# Patient Record
Sex: Male | Born: 1970
Health system: Southern US, Community
[De-identification: ages and names within clinical notes are randomized; demographics above are authoritative.]

## PROBLEM LIST (undated history)

## (undated) DIAGNOSIS — F419 Anxiety disorder, unspecified: Secondary | ICD-10-CM

## (undated) DIAGNOSIS — M5030 Other cervical disc degeneration, unspecified cervical region: Secondary | ICD-10-CM

## (undated) DIAGNOSIS — F329 Major depressive disorder, single episode, unspecified: Secondary | ICD-10-CM

## (undated) DIAGNOSIS — E785 Hyperlipidemia, unspecified: Secondary | ICD-10-CM

## (undated) DIAGNOSIS — F32A Depression, unspecified: Secondary | ICD-10-CM

## (undated) DIAGNOSIS — I129 Hypertensive chronic kidney disease with stage 1 through stage 4 chronic kidney disease, or unspecified chronic kidney disease: Secondary | ICD-10-CM

## (undated) DIAGNOSIS — F17200 Nicotine dependence, unspecified, uncomplicated: Secondary | ICD-10-CM

## (undated) DIAGNOSIS — L409 Psoriasis, unspecified: Secondary | ICD-10-CM

## (undated) DIAGNOSIS — E119 Type 2 diabetes mellitus without complications: Secondary | ICD-10-CM

## (undated) DIAGNOSIS — I10 Essential (primary) hypertension: Secondary | ICD-10-CM

## (undated) DIAGNOSIS — R7303 Prediabetes: Secondary | ICD-10-CM

## (undated) HISTORY — DX: Depression, unspecified: F32.A

## (undated) HISTORY — DX: Essential (primary) hypertension: I10

## (undated) HISTORY — DX: Type 2 diabetes mellitus without complications: E11.9

## (undated) HISTORY — DX: Anxiety disorder, unspecified: F41.9

## (undated) HISTORY — DX: Nicotine dependence, unspecified, uncomplicated: F17.200

## (undated) HISTORY — DX: Hypertensive chronic kidney disease with stage 1 through stage 4 chronic kidney disease, or unspecified chronic kidney disease: I12.9

## (undated) HISTORY — PX: WISDOM TOOTH EXTRACTION: SHX21

## (undated) HISTORY — DX: Major depressive disorder, single episode, unspecified: F32.9

## (undated) HISTORY — DX: Hyperlipidemia, unspecified: E78.5

---

## 2014-08-31 ENCOUNTER — Ambulatory Visit: Payer: Self-pay

## 2014-12-28 ENCOUNTER — Encounter: Payer: Self-pay | Admitting: *Deleted

## 2014-12-28 DIAGNOSIS — F17200 Nicotine dependence, unspecified, uncomplicated: Secondary | ICD-10-CM

## 2014-12-28 DIAGNOSIS — E1169 Type 2 diabetes mellitus with other specified complication: Secondary | ICD-10-CM | POA: Insufficient documentation

## 2014-12-28 DIAGNOSIS — I10 Essential (primary) hypertension: Secondary | ICD-10-CM

## 2014-12-28 DIAGNOSIS — E669 Obesity, unspecified: Secondary | ICD-10-CM

## 2014-12-28 DIAGNOSIS — I129 Hypertensive chronic kidney disease with stage 1 through stage 4 chronic kidney disease, or unspecified chronic kidney disease: Secondary | ICD-10-CM

## 2014-12-28 DIAGNOSIS — E785 Hyperlipidemia, unspecified: Secondary | ICD-10-CM

## 2015-02-23 ENCOUNTER — Encounter (INDEPENDENT_AMBULATORY_CARE_PROVIDER_SITE_OTHER): Payer: Self-pay

## 2015-02-23 ENCOUNTER — Telehealth: Payer: Self-pay

## 2015-02-23 ENCOUNTER — Ambulatory Visit (INDEPENDENT_AMBULATORY_CARE_PROVIDER_SITE_OTHER): Payer: 59 | Admitting: Unknown Physician Specialty

## 2015-02-23 ENCOUNTER — Encounter: Payer: Self-pay | Admitting: Unknown Physician Specialty

## 2015-02-23 VITALS — BP 111/76 | HR 65 | Temp 98.3°F | Wt 257.6 lb

## 2015-02-23 DIAGNOSIS — Z0000 Encounter for general adult medical examination without abnormal findings: Secondary | ICD-10-CM | POA: Diagnosis not present

## 2015-02-23 DIAGNOSIS — J309 Allergic rhinitis, unspecified: Secondary | ICD-10-CM | POA: Diagnosis not present

## 2015-02-23 DIAGNOSIS — E785 Hyperlipidemia, unspecified: Secondary | ICD-10-CM | POA: Diagnosis not present

## 2015-02-23 DIAGNOSIS — I10 Essential (primary) hypertension: Secondary | ICD-10-CM

## 2015-02-23 DIAGNOSIS — G8929 Other chronic pain: Secondary | ICD-10-CM | POA: Insufficient documentation

## 2015-02-23 DIAGNOSIS — M545 Low back pain: Secondary | ICD-10-CM | POA: Insufficient documentation

## 2015-02-23 DIAGNOSIS — M5441 Lumbago with sciatica, right side: Secondary | ICD-10-CM | POA: Diagnosis not present

## 2015-02-23 MED ORDER — FLUTICASONE PROPIONATE 50 MCG/ACT NA SUSP: 2.0000 | Freq: Every day | NASAL | Status: DC

## 2015-02-23 NOTE — Assessment & Plan Note (Signed)
With sciatica that comes and goes now.  Encouraged stretching and keeping as active as possible.  Discussed weight loss.

## 2015-02-23 NOTE — Patient Instructions (Signed)

## 2015-02-23 NOTE — Assessment & Plan Note (Signed)
Stable.  Continue present meds 

## 2015-02-23 NOTE — Assessment & Plan Note (Signed)
Rx for Flonase for allergies

## 2015-02-23 NOTE — Telephone Encounter (Signed)
Pt.notified

## 2015-02-23 NOTE — Telephone Encounter (Signed)
-----   Message from Kathrine Haddock, NP sent at 02/23/2015  5:04 PM EDT ----- Regarding: Please let pt know that I forgot To tell him I am giving flonase for allergies.  I sent it to the pharmacy

## 2015-02-23 NOTE — Assessment & Plan Note (Signed)
Check lipid panel  

## 2015-02-23 NOTE — Progress Notes (Signed)
BP 111/76 mmHg  Pulse 65  Temp(Src) 98.3 F (36.8 C) (Oral)  Wt 257 lb 9.6 oz (116.847 kg)  SpO2 97%   Subjective:    Patient ID: Lance Kim, male    DOB: 1971-08-06, 44 y.o.   MRN: [id number]  HPI: Lance Kim is a 44 y.o. male  Chief Complaint  Patient presents with  . Annual Exam    Sciatic pain  . Sinus Problem    Relevant past medical, surgical, family and social history reviewed and updated as indicated. Interim medical history since our last visit reviewed. Allergies and medications reviewed and updated.  Sinus Problem This is a new problem. The problem is unchanged. There has been no fever. Associated symptoms include sinus pressure and sneezing. Past treatments include nothing.  Hypertension This is a chronic problem. The current episode started more than 1 year ago. The problem is controlled. The current treatment provides significant improvement. There are no compliance problems.         Review of Systems  Constitutional: Negative.   HENT: Positive for rhinorrhea, sinus pressure and sneezing.   Eyes: Negative.   Respiratory: Negative.   Cardiovascular: Negative.   Gastrointestinal: Negative.   Endocrine: Negative.   Genitourinary: Negative.   Musculoskeletal: Positive for back pain.       Back pain with radiation to ankle.  Comes and goes.    Skin: Negative for color change.       Per dermatology  Allergic/Immunologic: Negative.   Neurological: Negative.   Hematological: Negative.   Psychiatric/Behavioral: Negative.     Per HPI unless specifically indicated above     Objective:    BP 111/76 mmHg  Pulse 65  Temp(Src) 98.3 F (36.8 C) (Oral)  Wt 257 lb 9.6 oz (116.847 kg)  SpO2 97%  Wt Readings from Last 3 Encounters:  02/23/15 257 lb 9.6 oz (116.847 kg)  11/24/14 255 lb (115.667 kg)  11/24/14 255 lb (115.667 kg)    Physical Exam  Constitutional: He is oriented to person, place, and time. He appears well-developed and  well-nourished.  HENT:  Head: Normocephalic.  Eyes: Pupils are equal, round, and reactive to light.  Cardiovascular: Normal rate, regular rhythm and normal heart sounds.   Pulmonary/Chest: Effort normal.  Abdominal: Soft. Bowel sounds are normal.  Musculoskeletal: Normal range of motion.  Neurological: He is alert and oriented to person, place, and time. He has normal reflexes.  Skin: Skin is warm and dry.  Changes related to psoriasis  Psychiatric: He has a normal mood and affect. His behavior is normal. Judgment and thought content normal.      No results found for this or any previous visit.    Assessment & Plan:   Problem List Items Addressed This Visit      Cardiovascular and Mediastinum   Benign essential HTN    Stable.  Continue present meds      Relevant Orders   Comprehensive metabolic panel     Respiratory   Allergic rhinitis    Rx for Flonase for allergies        Other   Hyperlipemia - Primary    Check lipid panel      Relevant Orders   Lipid Panel w/o Chol/HDL Ratio   Low back pain    With sciatica that comes and goes now.  Encouraged stretching and keeping as active as possible.  Discussed weight loss.          Other Visit Diagnoses  Routine general medical examination at a health care facility        Relevant Orders    TSH    CBC        Follow up plan: Return in about 6 months (around 08/25/2015).

## 2015-02-24 LAB — CBC
HEMOGLOBIN: 14.5 g/dL (ref 12.6–17.7)
Hematocrit: 43 % (ref 37.5–51.0)
MCH: 29.2 pg (ref 26.6–33.0)
MCHC: 33.7 g/dL (ref 31.5–35.7)
MCV: 87 fL (ref 79–97)
Platelets: 222 10*3/uL (ref 150–379)
RBC: 4.96 x10E6/uL (ref 4.14–5.80)
RDW: 14.3 % (ref 12.3–15.4)
WBC: 7.9 10*3/uL (ref 3.4–10.8)

## 2015-02-24 LAB — COMPREHENSIVE METABOLIC PANEL
ALBUMIN: 4.1 g/dL (ref 3.5–5.5)
ALT: 30 IU/L (ref 0–44)
AST: 23 IU/L (ref 0–40)
Albumin/Globulin Ratio: 1.4 (ref 1.1–2.5)
Alkaline Phosphatase: 85 IU/L (ref 39–117)
BUN / CREAT RATIO: 12 (ref 9–20)
BUN: 14 mg/dL (ref 6–24)
Bilirubin Total: 0.2 mg/dL (ref 0.0–1.2)
CHLORIDE: 99 mmol/L (ref 97–108)
CO2: 25 mmol/L (ref 18–29)
Calcium: 9.6 mg/dL (ref 8.7–10.2)
Creatinine, Ser: 1.13 mg/dL (ref 0.76–1.27)
GFR calc non Af Amer: 78 mL/min/{1.73_m2} (ref >59)
GFR, EST AFRICAN AMERICAN: 90 mL/min/{1.73_m2} (ref >59)
Globulin, Total: 3 g/dL (ref 1.5–4.5)
Glucose: 92 mg/dL (ref 65–99)
Potassium: 4.5 mmol/L (ref 3.5–5.2)
SODIUM: 139 mmol/L (ref 134–144)
TOTAL PROTEIN: 7.1 g/dL (ref 6.0–8.5)

## 2015-02-24 LAB — TSH: TSH: 0.715 u[IU]/mL (ref 0.450–4.500)

## 2015-02-24 LAB — LIPID PANEL W/O CHOL/HDL RATIO
CHOLESTEROL TOTAL: 124 mg/dL (ref 100–199)
HDL: 33 mg/dL — AB (ref >39)
LDL Calculated: 61 mg/dL (ref 0–99)
Triglycerides: 151 mg/dL — ABNORMAL HIGH (ref 0–149)
VLDL CHOLESTEROL CAL: 30 mg/dL (ref 5–40)

## 2015-02-26 ENCOUNTER — Telehealth: Payer: Self-pay

## 2015-02-26 NOTE — Telephone Encounter (Signed)
Called and spoke to pt's mother, she stated he was not there so I asked for him to give [place] a call back.

## 2015-02-26 NOTE — Telephone Encounter (Signed)
-----   Message from Kathrine Haddock, NP sent at 02/26/2015  8:13 AM EDT ----- Call tell patient that labs are normal

## 2015-04-08 ENCOUNTER — Other Ambulatory Visit: Payer: Self-pay | Admitting: Unknown Physician Specialty

## 2015-05-04 ENCOUNTER — Ambulatory Visit (INDEPENDENT_AMBULATORY_CARE_PROVIDER_SITE_OTHER): Payer: 59 | Admitting: Unknown Physician Specialty

## 2015-05-04 ENCOUNTER — Encounter: Payer: Self-pay | Admitting: Unknown Physician Specialty

## 2015-05-04 VITALS — BP 122/89 | HR 68 | Temp 98.0°F | Ht 64.2 in | Wt 259.0 lb

## 2015-05-04 DIAGNOSIS — J069 Acute upper respiratory infection, unspecified: Secondary | ICD-10-CM | POA: Diagnosis not present

## 2015-05-04 MED ORDER — AMOXICILLIN 875 MG PO TABS: 875.0000 mg | ORAL_TABLET | Freq: Two times a day (BID) | ORAL | Status: DC

## 2015-05-04 NOTE — Progress Notes (Signed)
   BP 122/89 mmHg  Pulse 68  Temp(Src) 98 F (36.7 C)  Ht 5' 4.2" (1.631 m)  Wt 259 lb (117.482 kg)  BMI 44.16 kg/m2  SpO2 98%   Subjective:    Patient ID: Lance Kim, male    DOB: 1970-09-16, 44 y.o.   MRN: [id number]  HPI: Lance Kim is a 44 y.o. male  Chief Complaint  Patient presents with  . Sinusitis    pt states he has runny nose, headache, facial pain, tooth pain, drainage, and cough   Sinusitis This is a new problem. The current episode started in the past 7 days. The problem has been gradually worsening since onset. There has been no fever. Associated symptoms include congestion, coughing, ear pain, headaches and sinus pressure. Pertinent negatives include no shortness of breath or sore throat. (Tooth pain) Past treatments include oral decongestants. The treatment provided no relief.    Relevant past medical, surgical, family and social history reviewed and updated as indicated. Interim medical history since our last visit reviewed. Allergies and medications reviewed and updated.  Review of Systems  HENT: Positive for congestion, ear pain and sinus pressure. Negative for sore throat.   Respiratory: Positive for cough. Negative for shortness of breath.   Neurological: Positive for headaches.    Per HPI unless specifically indicated above     Objective:    BP 122/89 mmHg  Pulse 68  Temp(Src) 98 F (36.7 C)  Ht 5' 4.2" (1.631 m)  Wt 259 lb (117.482 kg)  BMI 44.16 kg/m2  SpO2 98%  Wt Readings from Last 3 Encounters:  05/04/15 259 lb (117.482 kg)  02/23/15 257 lb 9.6 oz (116.847 kg)  11/24/14 255 lb (115.667 kg)    Physical Exam  Constitutional: He is oriented to person, place, and time. He appears well-developed and well-nourished. No distress.  HENT:  Head: Normocephalic and atraumatic.  Right Ear: Tympanic membrane, external ear and ear canal normal.  Left Ear: Tympanic membrane, external ear and ear canal normal.  Nose: Rhinorrhea present.  Right sinus exhibits no maxillary sinus tenderness and no frontal sinus tenderness. Left sinus exhibits no maxillary sinus tenderness and no frontal sinus tenderness.  Mouth/Throat: Mucous membranes are normal. Posterior oropharyngeal erythema present.  Eyes: Conjunctivae and lids are normal. Right eye exhibits no discharge. Left eye exhibits no discharge. No scleral icterus.  Cardiovascular: Normal rate and regular rhythm.   Pulmonary/Chest: Effort normal and breath sounds normal. No respiratory distress.  Abdominal: Normal appearance. There is no splenomegaly or hepatomegaly.  Musculoskeletal: Normal range of motion.  Neurological: He is alert and oriented to person, place, and time.  Skin: Skin is intact. No rash noted. No pallor.  Psychiatric: He has a normal mood and affect. His behavior is normal. Judgment and thought content normal.  Nursing note reviewed.      Assessment & Plan:   Problem List Items Addressed This Visit    None    Visit Diagnoses    URI (upper respiratory infection)    -  Primary    ? sinusitis.  Will rx antibiotic and if improveing, will not take        Follow up plan: Return if symptoms worsen or fail to improve.

## 2015-05-08 ENCOUNTER — Other Ambulatory Visit: Payer: Self-pay | Admitting: Unknown Physician Specialty

## 2015-06-09 ENCOUNTER — Other Ambulatory Visit: Payer: Self-pay | Admitting: Unknown Physician Specialty

## 2015-08-06 ENCOUNTER — Encounter: Payer: Self-pay | Admitting: Unknown Physician Specialty

## 2015-08-06 ENCOUNTER — Ambulatory Visit (INDEPENDENT_AMBULATORY_CARE_PROVIDER_SITE_OTHER): Payer: 59 | Admitting: Unknown Physician Specialty

## 2015-08-06 VITALS — BP 123/84 | HR 69 | Temp 98.5°F | Ht 62.7 in | Wt 259.2 lb

## 2015-08-06 DIAGNOSIS — J0100 Acute maxillary sinusitis, unspecified: Secondary | ICD-10-CM | POA: Diagnosis not present

## 2015-08-06 MED ORDER — AMOXICILLIN-POT CLAVULANATE 875-125 MG PO TABS: 1.0000 | ORAL_TABLET | Freq: Two times a day (BID) | ORAL | Status: DC

## 2015-08-06 NOTE — Progress Notes (Signed)
BP 123/84 mmHg  Pulse 69  Temp(Src) 98.5 F (36.9 C)  Ht 5' 2.7" (1.593 m)  Wt 259 lb 3.2 oz (117.572 kg)  BMI 46.33 kg/m2  SpO2 98%   Subjective:    Patient ID: Lance Kim, male    DOB: 1971-07-25, 44 y.o.   MRN: [id number]  HPI: Lance Kim is a 44 y.o. male  Chief Complaint  Patient presents with  . URI    pt states he has stuffy nose, cough (productive), sore throat, sneezing, body aches and nasal cogestion.  States he has tried OTC medications that helped a few weeks ago but now symptoms have come back.   . Pain    pt states he has pain in left shoulder and on left side of chest.   URI  This is a new problem. The current episode started 1 to 4 weeks ago. The problem has been gradually worsening (got better and then worse). There has been no fever. Associated symptoms include chest pain, congestion, coughing and sinus pain. Pertinent negatives include no rash, sore throat or wheezing. He has tried decongestant and antihistamine for the symptoms. The treatment provided moderate relief.     Relevant past medical, surgical, family and social history reviewed and updated as indicated. Interim medical history since our last visit reviewed. Allergies and medications reviewed and updated.  Review of Systems  HENT: Positive for congestion. Negative for sore throat.   Respiratory: Positive for cough. Negative for wheezing.   Cardiovascular: Positive for chest pain.  Skin: Negative for rash.    Per HPI unless specifically indicated above     Objective:    BP 123/84 mmHg  Pulse 69  Temp(Src) 98.5 F (36.9 C)  Ht 5' 2.7" (1.593 m)  Wt 259 lb 3.2 oz (117.572 kg)  BMI 46.33 kg/m2  SpO2 98%  Wt Readings from Last 3 Encounters:  08/06/15 259 lb 3.2 oz (117.572 kg)  05/04/15 259 lb (117.482 kg)  02/23/15 257 lb 9.6 oz (116.847 kg)    Physical Exam  Constitutional: He is oriented to person, place, and time. He appears well-developed and well-nourished. No distress.   HENT:  Head: Normocephalic and atraumatic.  Right Ear: Tympanic membrane and ear canal normal.  Left Ear: Tympanic membrane and ear canal normal.  Nose: Mucosal edema and sinus tenderness present. Right sinus exhibits maxillary sinus tenderness. Left sinus exhibits maxillary sinus tenderness.  Mouth/Throat: Mucous membranes are normal. Oropharyngeal exudate and posterior oropharyngeal edema present.  Eyes: Conjunctivae and lids are normal. Right eye exhibits no discharge. Left eye exhibits no discharge. No scleral icterus.  Cardiovascular: Normal rate and regular rhythm.   Pulmonary/Chest: Effort normal. No respiratory distress.  Abdominal: Normal appearance and bowel sounds are normal. He exhibits no distension. There is no splenomegaly or hepatomegaly. There is no tenderness.  Musculoskeletal: Normal range of motion.  Neurological: He is alert and oriented to person, place, and time.  Skin: Skin is intact. No rash noted. No pallor.  Psychiatric: He has a normal mood and affect. His behavior is normal. Judgment and thought content normal.       Assessment & Plan:   Problem List Items Addressed This Visit    None    Visit Diagnoses    Acute maxillary sinusitis, recurrence not specified    -  Primary    Relevant Medications    amoxicillin-clavulanate (AUGMENTIN) 875-125 MG tablet        Follow up plan: Return if symptoms worsen or fail  to improve.

## 2015-08-27 ENCOUNTER — Encounter: Payer: Self-pay | Admitting: Unknown Physician Specialty

## 2015-08-27 ENCOUNTER — Ambulatory Visit (INDEPENDENT_AMBULATORY_CARE_PROVIDER_SITE_OTHER): Payer: 59 | Admitting: Unknown Physician Specialty

## 2015-08-27 VITALS — BP 120/82 | HR 76 | Temp 98.4°F | Ht 63.7 in | Wt 258.8 lb

## 2015-08-27 DIAGNOSIS — E669 Obesity, unspecified: Secondary | ICD-10-CM

## 2015-08-27 DIAGNOSIS — F17200 Nicotine dependence, unspecified, uncomplicated: Secondary | ICD-10-CM | POA: Diagnosis not present

## 2015-08-27 DIAGNOSIS — E785 Hyperlipidemia, unspecified: Secondary | ICD-10-CM | POA: Diagnosis not present

## 2015-08-27 DIAGNOSIS — I10 Essential (primary) hypertension: Secondary | ICD-10-CM | POA: Diagnosis not present

## 2015-08-27 MED ORDER — FLUOXETINE HCL 20 MG PO TABS: 20.0000 mg | ORAL_TABLET | Freq: Every day | ORAL | Status: DC

## 2015-08-27 MED ORDER — LISINOPRIL-HYDROCHLOROTHIAZIDE 20-12.5 MG PO TABS: 1.0000 | ORAL_TABLET | Freq: Every day | ORAL | Status: DC

## 2015-08-27 MED ORDER — MELOXICAM 7.5 MG PO TABS: 7.5000 mg | ORAL_TABLET | Freq: Every day | ORAL | Status: DC

## 2015-08-27 MED ORDER — AMLODIPINE BESYLATE 10 MG PO TABS: 10.0000 mg | ORAL_TABLET | Freq: Every day | ORAL | Status: DC

## 2015-08-27 NOTE — Assessment & Plan Note (Signed)
Work on diet

## 2015-08-27 NOTE — Assessment & Plan Note (Signed)
Stable, continue present medications.   

## 2015-08-27 NOTE — Assessment & Plan Note (Signed)
Not ready to quit.  

## 2015-08-27 NOTE — Assessment & Plan Note (Signed)
Refill Mobic

## 2015-08-27 NOTE — Assessment & Plan Note (Signed)
Check Lipid panel 

## 2015-08-27 NOTE — Addendum Note (Signed)
Addended by: Kathrine Haddock on: 08/27/2015 04:47 PM   Modules accepted: Orders

## 2015-08-27 NOTE — Progress Notes (Signed)
+  BP 120/82 mmHg  Pulse 76  Temp(Src) 98.4 F (36.9 C)  Ht 5' 3.7" (1.618 m)  Wt 258 lb 12.8 oz (117.391 kg)  BMI 44.84 kg/m2  SpO2 97%   Subjective:    Patient ID: Lance Kim, male    DOB: [birth year], 44 y.o.   MRN: [id number]  HPI: Lance Kim is a 44 y.o. male  Chief Complaint  Patient presents with  . Hypertension  . Hyperlipidemia  . Depression   Hypertension Using medications without difficulty Average home BPs: not cheicking   No problems or lightheadedness No chest pain with exertion or shortness of breath No Edema   Hyperlipidemia Using medications without problems: No Muscle aches  Diet compliance: "not where I want it."  "A work in progress" Exercise:  "not where I want it."  "A work in progress"  Depression Stable.  Pt states he feels good "most of the time." Depression screen [facility] 2/9 08/27/2015  Decreased Interest 0  Down, Depressed, Hopeless 0  PHQ - 2 Score 0   Low back pain Takes Mobic periodically  Relevant past medical, surgical, family and social history reviewed and updated as indicated. Interim medical history since our last visit reviewed. Allergies and medications reviewed and updated.  Review of Systems  Per HPI unless specifically indicated above     Objective:    BP 120/82 mmHg  Pulse 76  Temp(Src) 98.4 F (36.9 C)  Ht 5' 3.7" (1.618 m)  Wt 258 lb 12.8 oz (117.391 kg)  BMI 44.84 kg/m2  SpO2 97%  Wt Readings from Last 3 Encounters:  08/27/15 258 lb 12.8 oz (117.391 kg)  08/06/15 259 lb 3.2 oz (117.572 kg)  05/04/15 259 lb (117.482 kg)    Physical Exam  Constitutional: He is oriented to person, place, and time. He appears well-developed and well-nourished. No distress.  HENT:  Head: Normocephalic and atraumatic.  Eyes: Conjunctivae and lids are normal. Right eye exhibits no discharge. Left eye exhibits no discharge. No scleral icterus.  Neck: Normal range of motion. Neck supple. No JVD present. Carotid bruit is  not present.  Cardiovascular: Normal rate, regular rhythm and normal heart sounds.   Pulmonary/Chest: Effort normal and breath sounds normal. No respiratory distress.  Abdominal: Normal appearance. There is no splenomegaly or hepatomegaly.  Musculoskeletal: Normal range of motion.  Neurological: He is alert and oriented to person, place, and time.  Skin: Skin is warm, dry and intact. No rash noted. No pallor.  Psychiatric: He has a normal mood and affect. His behavior is normal. Judgment and thought content normal.    Assessment & Plan:   Problem List Items Addressed This Visit      Unprioritized   Hyperlipemia    Check Lipid panel       Relevant Medications   amLODipine (NORVASC) 10 MG tablet   lisinopril-hydrochlorothiazide (PRINZIDE,ZESTORETIC) 20-12.5 MG tablet   Other Relevant Orders   Lipid Panel Piccolo, Waived   Benign essential HTN - Primary    Stable, continue present medications.        Relevant Medications   amLODipine (NORVASC) 10 MG tablet   lisinopril-hydrochlorothiazide (PRINZIDE,ZESTORETIC) 20-12.5 MG tablet   Other Relevant Orders   Microalbumin, Urine Waived   Uric acid   Comprehensive metabolic panel   Obesity    Work on diet      Tobacco use disorder    Not ready to quit         Non-fasting labs Follow up plan: Return in  about 6 months (around 02/25/2016) for physical.

## 2015-08-28 ENCOUNTER — Encounter: Payer: Self-pay | Admitting: Unknown Physician Specialty

## 2015-08-28 LAB — COMPREHENSIVE METABOLIC PANEL
A/G RATIO: 1.3 (ref 1.1–2.5)
ALK PHOS: 93 IU/L (ref 39–117)
ALT: 30 IU/L (ref 0–44)
AST: 22 IU/L (ref 0–40)
Albumin: 4 g/dL (ref 3.5–5.5)
BUN/Creatinine Ratio: 8 — ABNORMAL LOW (ref 9–20)
BUN: 9 mg/dL (ref 6–24)
CALCIUM: 10 mg/dL (ref 8.7–10.2)
CHLORIDE: 98 mmol/L (ref 96–106)
CO2: 24 mmol/L (ref 18–29)
Creatinine, Ser: 1.11 mg/dL (ref 0.76–1.27)
GFR calc Af Amer: 92 mL/min/{1.73_m2} (ref >59)
GFR, EST NON AFRICAN AMERICAN: 80 mL/min/{1.73_m2} (ref >59)
GLOBULIN, TOTAL: 3.1 g/dL (ref 1.5–4.5)
Glucose: 92 mg/dL (ref 65–99)
POTASSIUM: 4.2 mmol/L (ref 3.5–5.2)
SODIUM: 138 mmol/L (ref 134–144)
Total Protein: 7.1 g/dL (ref 6.0–8.5)

## 2015-08-28 LAB — URIC ACID: URIC ACID: 6.8 mg/dL (ref 3.7–8.6)

## 2015-08-28 LAB — LIPID PANEL W/O CHOL/HDL RATIO
CHOLESTEROL TOTAL: 117 mg/dL (ref 100–199)
HDL: 35 mg/dL — AB (ref >39)
LDL Calculated: 61 mg/dL (ref 0–99)
TRIGLYCERIDES: 105 mg/dL (ref 0–149)
VLDL Cholesterol Cal: 21 mg/dL (ref 5–40)

## 2015-08-28 LAB — MICROALBUMIN, URINE WAIVED
CREATININE, URINE WAIVED: 100 mg/dL (ref 10–300)
MICROALB, UR WAIVED: 10 mg/L (ref 0–19)

## 2015-08-28 NOTE — Progress Notes (Signed)
Quick Note:  Normal labs. Patient notified by letter. ______ 

## 2015-09-04 ENCOUNTER — Other Ambulatory Visit: Payer: Self-pay | Admitting: Family Medicine

## 2015-09-04 ENCOUNTER — Telehealth: Payer: Self-pay

## 2015-09-04 MED ORDER — AMOXICILLIN-POT CLAVULANATE 875-125 MG PO TABS: 1.0000 | ORAL_TABLET | Freq: Two times a day (BID) | ORAL | Status: DC

## 2015-09-04 NOTE — Telephone Encounter (Signed)
Patient called and stated he feel like he is getting the same thing he had last month. Patient was here 08/06/15 and was give amoxicillin-clavulanate (augmentin). Wants to know if he can get another round or if he needs to come in to be seen.

## 2015-09-04 NOTE — Telephone Encounter (Signed)
I accidentally closed the encounter I just opened for this patient before routing it so I am doing a new one. Patient called and stated he feels like he is getting the same thing he had a month ago. Patient was seen 08/06/15 and was given amoxicillin-clavulanate (augmentin). Patient wants to know if he needs to come in or if he can get some more medicine.

## 2015-09-04 NOTE — Telephone Encounter (Signed)
rx given

## 2015-09-05 NOTE — Telephone Encounter (Signed)
Called and let patient know another rx was given.

## 2016-01-09 ENCOUNTER — Encounter: Payer: Self-pay | Admitting: Unknown Physician Specialty

## 2016-02-05 ENCOUNTER — Ambulatory Visit (INDEPENDENT_AMBULATORY_CARE_PROVIDER_SITE_OTHER): Payer: 59 | Admitting: Unknown Physician Specialty

## 2016-02-05 ENCOUNTER — Encounter: Payer: Self-pay | Admitting: Unknown Physician Specialty

## 2016-02-05 VITALS — BP 126/75 | HR 66 | Temp 98.7°F | Ht 64.1 in | Wt 254.6 lb

## 2016-02-05 DIAGNOSIS — J029 Acute pharyngitis, unspecified: Secondary | ICD-10-CM

## 2016-02-05 DIAGNOSIS — M5441 Lumbago with sciatica, right side: Secondary | ICD-10-CM | POA: Diagnosis not present

## 2016-02-05 MED ORDER — MELOXICAM 7.5 MG PO TABS: 7.5000 mg | ORAL_TABLET | Freq: Every day | ORAL | Status: DC

## 2016-02-05 MED ORDER — AZITHROMYCIN 250 MG PO TABS: ORAL_TABLET | ORAL | Status: DC

## 2016-02-05 NOTE — Progress Notes (Signed)
BP 126/75 mmHg  Pulse 66  Temp(Src) 98.7 F (37.1 C)  Ht 5' 4.1" (1.628 m)  Wt 254 lb 9.6 oz (115.486 kg)  BMI 43.57 kg/m2  SpO2 98%   Subjective:    Patient ID: Lance Kim, male    DOB: 01-29-1971, 45 y.o.   MRN: [id number]  HPI: Lance Kim is a 45 y.o. male  Chief Complaint  Patient presents with  . Sore Throat    pt states his throat has been bothering him since Saturday  . Back Pain    pt states has been bothering him a lot here lately, states the pain runs down into both legs   Sore Throat  This is a new problem. Episode onset: 3 days. The problem has been unchanged. The pain is severe. Associated symptoms include coughing and trouble swallowing. Pertinent negatives include no abdominal pain, ear pain, headaches, shortness of breath, stridor or swollen glands.  Back Pain This is a new problem. Episode onset: 3 weeks. The problem occurs constantly. The problem has been gradually improving (since stoping aerobics.  He is walking) since onset. The pain is present in the lumbar spine. The quality of the pain is described as aching. The pain radiates to the right knee. Exacerbated by: aerobics. Pertinent negatives include no abdominal pain, bowel incontinence, headaches, numbness, paresthesias, weakness or weight loss. Risk factors include obesity. He has tried NSAIDs for the symptoms. The treatment provided moderate relief.    Relevant past medical, surgical, family and social history reviewed and updated as indicated. Interim medical history since our last visit reviewed. Allergies and medications reviewed and updated.  Review of Systems  Constitutional: Negative for weight loss.  HENT: Positive for trouble swallowing. Negative for ear pain.   Respiratory: Positive for cough. Negative for shortness of breath and stridor.   Gastrointestinal: Negative for abdominal pain and bowel incontinence.  Musculoskeletal: Positive for back pain.  Neurological: Negative for  weakness, numbness, headaches and paresthesias.    Per HPI unless specifically indicated above     Objective:    BP 126/75 mmHg  Pulse 66  Temp(Src) 98.7 F (37.1 C)  Ht 5' 4.1" (1.628 m)  Wt 254 lb 9.6 oz (115.486 kg)  BMI 43.57 kg/m2  SpO2 98%  Wt Readings from Last 3 Encounters:  02/05/16 254 lb 9.6 oz (115.486 kg)  08/27/15 258 lb 12.8 oz (117.391 kg)  08/06/15 259 lb 3.2 oz (117.572 kg)    Physical Exam  Constitutional: He is oriented to person, place, and time. He appears well-developed and well-nourished. No distress.  HENT:  Head: Normocephalic and atraumatic.  Right Ear: External ear and ear canal normal.  Left Ear: External ear and ear canal normal.  Nose: Nose normal.  Mouth/Throat: Mucous membranes are normal. Oropharyngeal exudate, posterior oropharyngeal edema and posterior oropharyngeal erythema present.  Eyes: Conjunctivae and lids are normal. Right eye exhibits no discharge. Left eye exhibits no discharge. No scleral icterus.  Neck: Normal range of motion. Neck supple. No JVD present. Carotid bruit is not present.  Cardiovascular: Normal rate, regular rhythm and normal heart sounds.   Pulmonary/Chest: Effort normal and breath sounds normal. No respiratory distress.  Abdominal: Normal appearance. There is no splenomegaly or hepatomegaly.  Musculoskeletal: Normal range of motion.       Lumbar back: He exhibits normal range of motion, no tenderness, no swelling, no pain and normal pulse.  Neurological: He is alert and oriented to person, place, and time.  Skin: Skin is warm,  dry and intact. No rash noted. No pallor.  Psychiatric: He has a normal mood and affect. His behavior is normal. Judgment and thought content normal.    Results for orders placed or performed in visit on 08/27/15  Microalbumin, Urine Waived  Result Value Ref Range   Microalb, Ur Waived 10 0 - 19 mg/L   Creatinine, Urine Waived 100 10 - 300 mg/dL   Microalb/Creat Ratio <30 <30 mg/g   Uric acid  Result Value Ref Range   Uric Acid 6.8 3.7 - 8.6 mg/dL  Comprehensive metabolic panel  Result Value Ref Range   Glucose 92 65 - 99 mg/dL   BUN 9 6 - 24 mg/dL   Creatinine, Ser 1.11 0.76 - 1.27 mg/dL   GFR calc non Af Amer 80 >59 mL/min/1.73   GFR calc Af Amer 92 >59 mL/min/1.73   BUN/Creatinine Ratio 8 (L) 9 - 20   Sodium 138 134 - 144 mmol/L   Potassium 4.2 3.5 - 5.2 mmol/L   Chloride 98 96 - 106 mmol/L   CO2 24 18 - 29 mmol/L   Calcium 10.0 8.7 - 10.2 mg/dL   Total Protein 7.1 6.0 - 8.5 g/dL   Albumin 4.0 3.5 - 5.5 g/dL   Globulin, Total 3.1 1.5 - 4.5 g/dL   Albumin/Globulin Ratio 1.3 1.1 - 2.5   Bilirubin Total <0.2 0.0 - 1.2 mg/dL   Alkaline Phosphatase 93 39 - 117 IU/L   AST 22 0 - 40 IU/L   ALT 30 0 - 44 IU/L  Lipid Panel w/o Chol/HDL Ratio  Result Value Ref Range   Cholesterol, Total 117 100 - 199 mg/dL   Triglycerides 105 0 - 149 mg/dL   HDL 35 (L) >39 mg/dL   VLDL Cholesterol Cal 21 5 - 40 mg/dL   LDL Calculated 61 0 - 99 mg/dL      Assessment & Plan:   Problem List Items Addressed This Visit      Unprioritized   Low back pain    Discussed walking, taking a break from aerobic videos and restarting more slowly.  Yoga for back pain and rx for Meloxicam      Relevant Medications   meloxicam (MOBIC) 7.5 MG tablet    Other Visit Diagnoses    Sore throat    -  Primary    Relevant Orders    Rapid strep screen (not at [facility])    Exudative pharyngitis        Rx for Z pack        Follow up plan: Return if symptoms worsen or fail to improve.

## 2016-02-05 NOTE — Assessment & Plan Note (Signed)
Discussed walking, taking a break from aerobic videos and restarting more slowly.  Yoga for back pain and rx for Meloxicam

## 2016-02-05 NOTE — Patient Instructions (Addendum)
[employer] for back pain for beginners Continue to walk  Back Pain, Adult Back pain is very common in adults.The cause of back pain is rarely dangerous and the pain often gets better over time.The cause of your back pain may not be known. Some common causes of back pain include:  Strain of the muscles or ligaments supporting the spine.  Wear and tear (degeneration) of the spinal disks.  Arthritis.  Direct injury to the back. For many people, back pain may return. Since back pain is rarely dangerous, most people can learn to manage this condition on their own. HOME CARE INSTRUCTIONS Watch your back pain for any changes. The following actions may help to lessen any discomfort you are feeling:  Remain active. It is stressful on your back to sit or stand in one place for long periods of time. Do not sit, drive, or stand in one place for more than 30 minutes at a time. Take short walks on even surfaces as soon as you are able.Try to increase the length of time you walk each day.  Exercise regularly as directed by your health care provider. Exercise helps your back heal faster. It also helps avoid future injury by keeping your muscles strong and flexible.  Do not stay in bed.Resting more than 1-2 days can delay your recovery.  Pay attention to your body when you bend and lift. The most comfortable positions are those that put less stress on your recovering back. Always use proper lifting techniques, including:  Bending your knees.  Keeping the load close to your body.  Avoiding twisting.  Find a comfortable position to sleep. Use a firm mattress and lie on your side with your knees slightly bent. If you lie on your back, put a pillow under your knees.  Avoid feeling anxious or stressed.Stress increases muscle tension and can worsen back pain.It is important to recognize when you are anxious or stressed and learn ways to manage it, such as with exercise.  Take medicines only as  directed by your health care provider. Over-the-counter medicines to reduce pain and inflammation are often the most helpful.Your health care provider may prescribe muscle relaxant drugs.These medicines help dull your pain so you can more quickly return to your normal activities and healthy exercise.  Apply ice to the injured area:  Put ice in a plastic bag.  Place a towel between your skin and the bag.  Leave the ice on for 20 minutes, 2-3 times a day for the first 2-3 days. After that, ice and heat may be alternated to reduce pain and spasms.  Maintain a healthy weight. Excess weight puts extra stress on your back and makes it difficult to maintain good posture. SEEK MEDICAL CARE IF:  You have pain that is not relieved with rest or medicine.  You have increasing pain going down into the legs or buttocks.  You have pain that does not improve in one week.  You have night pain.  You lose weight.  You have a fever or chills. SEEK IMMEDIATE MEDICAL CARE IF:   You develop new bowel or bladder control problems.  You have unusual weakness or numbness in your arms or legs.  You develop nausea or vomiting.  You develop abdominal pain.  You feel faint.   This information is not intended to replace advice given to you by your health care provider. Make sure you discuss any questions you have with your health care provider.   Document Released: 09/01/2005 Document  Revised: 09/22/2014 Document Reviewed: 01/03/2014 Elsevier Interactive Patient Education [employer].

## 2016-02-06 ENCOUNTER — Telehealth: Payer: Self-pay | Admitting: Unknown Physician Specialty

## 2016-02-06 ENCOUNTER — Telehealth: Payer: Self-pay

## 2016-02-06 NOTE — Telephone Encounter (Signed)
Opened in error

## 2016-02-06 NOTE — Telephone Encounter (Signed)
Patient notified. I asked for him to give [place] a call if he gets sick or gets the continuous hiccups again.

## 2016-02-06 NOTE — Telephone Encounter (Signed)
Pt called this morning stating he called on call provider last night.  He's had hiccups consistently since early evening yesterday.  He was instructed to call us this morning.  Please call patient.

## 2016-02-06 NOTE — Telephone Encounter (Signed)
Routing to provider for advice.

## 2016-02-06 NOTE — Telephone Encounter (Signed)
Called and spoke to patient. He stated that the hiccups started yesterday before he started taking the z-pack. Patient states he has done a few things to stop the hiccups but they come back. Patient also stated that he has gotten sick a few times during the night and the last time he did, was about 20 minutes ago. Patient stated that he was kind of "eye balling" what he spit up and saw something that looked like an insect to him. Patient states since then, he has not gotten sick again or had anymore hiccups.

## 2016-02-06 NOTE — Telephone Encounter (Signed)
Ask if they are still a problem.  If so, I will try a muscle relaxant

## 2016-02-06 NOTE — Telephone Encounter (Signed)
OK.  We will not treat for now

## 2016-02-08 LAB — CULTURE, GROUP A STREP: Strep A Culture: NEGATIVE

## 2016-02-08 LAB — RAPID STREP SCREEN (MED CTR MEBANE ONLY): STREP GP A AG, IA W/REFLEX: NEGATIVE

## 2016-02-25 ENCOUNTER — Encounter: Payer: 59 | Admitting: Unknown Physician Specialty

## 2016-03-06 ENCOUNTER — Other Ambulatory Visit: Payer: Self-pay | Admitting: Unknown Physician Specialty

## 2016-04-07 ENCOUNTER — Encounter: Payer: Self-pay | Admitting: Unknown Physician Specialty

## 2016-04-07 ENCOUNTER — Ambulatory Visit (INDEPENDENT_AMBULATORY_CARE_PROVIDER_SITE_OTHER): Payer: 59 | Admitting: Unknown Physician Specialty

## 2016-04-07 VITALS — BP 112/81 | HR 73 | Temp 99.1°F | Ht 63.0 in | Wt 256.0 lb

## 2016-04-07 DIAGNOSIS — E785 Hyperlipidemia, unspecified: Secondary | ICD-10-CM

## 2016-04-07 DIAGNOSIS — E669 Obesity, unspecified: Secondary | ICD-10-CM | POA: Diagnosis not present

## 2016-04-07 DIAGNOSIS — I10 Essential (primary) hypertension: Secondary | ICD-10-CM

## 2016-04-07 DIAGNOSIS — L409 Psoriasis, unspecified: Secondary | ICD-10-CM | POA: Diagnosis not present

## 2016-04-07 DIAGNOSIS — L4050 Arthropathic psoriasis, unspecified: Secondary | ICD-10-CM | POA: Insufficient documentation

## 2016-04-07 DIAGNOSIS — Z0000 Encounter for general adult medical examination without abnormal findings: Secondary | ICD-10-CM | POA: Diagnosis not present

## 2016-04-07 DIAGNOSIS — F17200 Nicotine dependence, unspecified, uncomplicated: Secondary | ICD-10-CM

## 2016-04-07 LAB — MICROALBUMIN, URINE WAIVED
Creatinine, Urine Waived: 100 mg/dL (ref 10–300)
Microalb, Ur Waived: 10 mg/L (ref 0–19)

## 2016-04-07 NOTE — Assessment & Plan Note (Signed)
Using triamcinolone as needed

## 2016-04-07 NOTE — Assessment & Plan Note (Signed)
"  I need to stop being lazy"

## 2016-04-07 NOTE — Assessment & Plan Note (Signed)
Stable, continue present medications.   

## 2016-04-07 NOTE — Progress Notes (Signed)
BP 112/81 (BP Location: Left Arm, Patient Position: Sitting, Cuff Size: Large)   Pulse 73   Temp 99.1 F (37.3 C)   Ht 5\' 3"  (1.6 m)   Wt 256 lb (116.1 kg)   SpO2 97%   BMI 45.35 kg/m    Subjective:    Patient ID: Lance Kim, male    DOB: May 05, 1971, 45 y.o.   MRN: [id number]  HPI: Lance Kim is a 45 y.o. male  Chief Complaint  Patient presents with  . Annual Exam   Hypertension Using medications without difficulty Average home BPsNot checking  No problems or lightheadedness No chest pain with exertion or shortness of breath No Edema   Hyperlipidemia Using medications without problems: No Muscle aches  Diet compliance: Eating a lot of salads, fruits, and vegetables.   Exercise: Not exercising    Relevant past medical, surgical, family and social history reviewed and updated as indicated. Interim medical history since our last visit reviewed. Allergies and medications reviewed and updated.  Review of Systems  Constitutional: Negative.   HENT: Negative.   Eyes: Negative.   Respiratory: Negative.   Cardiovascular: Negative.   Gastrointestinal: Negative.   Endocrine: Negative.   Genitourinary: Negative.   Skin: Negative.   Allergic/Immunologic: Negative.   Neurological: Negative.   Hematological: Negative.   Psychiatric/Behavioral: Negative.     Per HPI unless specifically indicated above     Objective:    BP 112/81 (BP Location: Left Arm, Patient Position: Sitting, Cuff Size: Large)   Pulse 73   Temp 99.1 F (37.3 C)   Ht 5\' 3"  (1.6 m)   Wt 256 lb (116.1 kg)   SpO2 97%   BMI 45.35 kg/m   Wt Readings from Last 3 Encounters:  04/07/16 256 lb (116.1 kg)  02/05/16 254 lb 9.6 oz (115.5 kg)  08/27/15 258 lb 12.8 oz (117.4 kg)    Physical Exam  Constitutional: He is oriented to person, place, and time. He appears well-developed and well-nourished.  HENT:  Head: Normocephalic.  Right Ear: Tympanic membrane, external ear and ear canal  normal.  Left Ear: Tympanic membrane, external ear and ear canal normal.  Mouth/Throat: Uvula is midline, oropharynx is clear and moist and mucous membranes are normal.  Eyes: Pupils are equal, round, and reactive to light.  Cardiovascular: Normal rate, regular rhythm and normal heart sounds.  Exam reveals no gallop and no friction rub.   No murmur heard. Pulmonary/Chest: Effort normal and breath sounds normal. No respiratory distress.  Abdominal: Soft. Bowel sounds are normal. He exhibits no distension. There is no tenderness.  Musculoskeletal: Normal range of motion.  Neurological: He is alert and oriented to person, place, and time. He has normal reflexes.  Skin: Skin is warm and dry.  Extensive psoriasis lower legs  Psychiatric: He has a normal mood and affect. His behavior is normal. Judgment and thought content normal.  Nursing note and vitals reviewed.   Results for orders placed or performed in visit on 02/05/16  Rapid strep screen (not at [facility])  Result Value Ref Range   Strep Gp A Ag, IA W/Reflex Negative Negative  Culture, Group A Strep  Result Value Ref Range   Strep A Culture Negative       Assessment & Plan:   Problem List Items Addressed This Visit      Unprioritized   Benign essential HTN    Stable, continue present medications.        Relevant Orders   Comprehensive metabolic  panel   Microalbumin, Urine Waived   Uric acid   Hyperlipemia    Stable, continue present medications.        Relevant Orders   Lipid Panel w/o Chol/HDL Ratio   Obesity    "I need to stop being lazy"      Relevant Orders   TSH   Psoriasis    Using triamcinolone as needed      Tobacco use disorder    Encouraged to quit.  Pt states he is not ready at this time       Other Visit Diagnoses    Health care maintenance    -  Primary   Relevant Orders   HIV antibody   CBC with Differential/Platelet   TSH       Follow up plan: Return in about 6 months (around  10/08/2016).

## 2016-04-07 NOTE — Assessment & Plan Note (Signed)
Encouraged to quit.  Pt states he is not ready at this time

## 2016-04-08 ENCOUNTER — Encounter: Payer: Self-pay | Admitting: Unknown Physician Specialty

## 2016-04-08 LAB — CBC WITH DIFFERENTIAL/PLATELET
BASOS ABS: 0 10*3/uL (ref 0.0–0.2)
Basos: 0 %
EOS (ABSOLUTE): 0.2 10*3/uL (ref 0.0–0.4)
Eos: 3 %
HEMATOCRIT: 42 % (ref 37.5–51.0)
HEMOGLOBIN: 13.9 g/dL (ref 12.6–17.7)
IMMATURE GRANS (ABS): 0 10*3/uL (ref 0.0–0.1)
Immature Granulocytes: 0 %
LYMPHS ABS: 2.9 10*3/uL (ref 0.7–3.1)
LYMPHS: 42 %
MCH: 27.9 pg (ref 26.6–33.0)
MCHC: 33.1 g/dL (ref 31.5–35.7)
MCV: 84 fL (ref 79–97)
MONOCYTES: 8 %
Monocytes Absolute: 0.5 10*3/uL (ref 0.1–0.9)
Neutrophils Absolute: 3.3 10*3/uL (ref 1.4–7.0)
Neutrophils: 47 %
Platelets: 259 10*3/uL (ref 150–379)
RBC: 4.98 x10E6/uL (ref 4.14–5.80)
RDW: 14.9 % (ref 12.3–15.4)
WBC: 6.9 10*3/uL (ref 3.4–10.8)

## 2016-04-08 LAB — COMPREHENSIVE METABOLIC PANEL
ALBUMIN: 4.2 g/dL (ref 3.5–5.5)
ALT: 29 IU/L (ref 0–44)
AST: 22 IU/L (ref 0–40)
Albumin/Globulin Ratio: 1.6 (ref 1.2–2.2)
Alkaline Phosphatase: 86 IU/L (ref 39–117)
BUN / CREAT RATIO: 9 (ref 9–20)
BUN: 10 mg/dL (ref 6–24)
Bilirubin Total: <0.2 mg/dL (ref 0.0–1.2)
CALCIUM: 9.1 mg/dL (ref 8.7–10.2)
CO2: 25 mmol/L (ref 18–29)
CREATININE: 1.15 mg/dL (ref 0.76–1.27)
Chloride: 97 mmol/L (ref 96–106)
GFR, EST AFRICAN AMERICAN: 88 mL/min/{1.73_m2} (ref >59)
GFR, EST NON AFRICAN AMERICAN: 76 mL/min/{1.73_m2} (ref >59)
GLOBULIN, TOTAL: 2.7 g/dL (ref 1.5–4.5)
GLUCOSE: 148 mg/dL — AB (ref 65–99)
Potassium: 4.3 mmol/L (ref 3.5–5.2)
SODIUM: 139 mmol/L (ref 134–144)
TOTAL PROTEIN: 6.9 g/dL (ref 6.0–8.5)

## 2016-04-08 LAB — LIPID PANEL W/O CHOL/HDL RATIO
CHOLESTEROL TOTAL: 125 mg/dL (ref 100–199)
HDL: 31 mg/dL — ABNORMAL LOW (ref >39)
LDL CALC: 56 mg/dL (ref 0–99)
Triglycerides: 190 mg/dL — ABNORMAL HIGH (ref 0–149)
VLDL CHOLESTEROL CAL: 38 mg/dL (ref 5–40)

## 2016-04-08 LAB — TSH: TSH: 1.24 u[IU]/mL (ref 0.450–4.500)

## 2016-04-08 LAB — HIV ANTIBODY (ROUTINE TESTING W REFLEX): HIV SCREEN 4TH GENERATION: NONREACTIVE

## 2016-04-08 LAB — URIC ACID: URIC ACID: 7.1 mg/dL (ref 3.7–8.6)

## 2016-04-08 NOTE — Progress Notes (Signed)
Pt notified through mychart.

## 2016-06-06 ENCOUNTER — Other Ambulatory Visit: Payer: Self-pay | Admitting: Unknown Physician Specialty

## 2016-06-12 ENCOUNTER — Other Ambulatory Visit: Payer: Self-pay | Admitting: Unknown Physician Specialty

## 2016-06-12 ENCOUNTER — Ambulatory Visit (INDEPENDENT_AMBULATORY_CARE_PROVIDER_SITE_OTHER): Payer: 59 | Admitting: Family Medicine

## 2016-06-12 ENCOUNTER — Encounter: Payer: Self-pay | Admitting: Family Medicine

## 2016-06-12 VITALS — BP 134/84 | HR 60 | Temp 98.9°F | Wt 272.0 lb

## 2016-06-12 DIAGNOSIS — I10 Essential (primary) hypertension: Secondary | ICD-10-CM | POA: Diagnosis not present

## 2016-06-12 DIAGNOSIS — L03811 Cellulitis of head [any part, except face]: Secondary | ICD-10-CM

## 2016-06-12 DIAGNOSIS — Z23 Encounter for immunization: Secondary | ICD-10-CM

## 2016-06-12 MED ORDER — AMLODIPINE BESYLATE 10 MG PO TABS: ORAL_TABLET | ORAL | 0 refills | Status: DC

## 2016-06-12 MED ORDER — LISINOPRIL-HYDROCHLOROTHIAZIDE 20-12.5 MG PO TABS: 1.0000 | ORAL_TABLET | Freq: Every day | ORAL | 0 refills | Status: DC

## 2016-06-12 MED ORDER — ATORVASTATIN CALCIUM 20 MG PO TABS: 20.0000 mg | ORAL_TABLET | Freq: Every day | ORAL | 1 refills | Status: DC

## 2016-06-12 MED ORDER — SULFAMETHOXAZOLE-TRIMETHOPRIM 800-160 MG PO TABS: 1.0000 | ORAL_TABLET | Freq: Two times a day (BID) | ORAL | 0 refills | Status: DC

## 2016-06-12 NOTE — Patient Instructions (Addendum)
Tdap Vaccine (Tetanus, Diphtheria and Pertussis): What You Need to Know 1. Why get vaccinated? Tetanus, diphtheria and pertussis are very serious diseases. Tdap vaccine can protect us from these diseases. And, Tdap vaccine given to pregnant women can protect newborn babies against pertussis. TETANUS (Lockjaw) is rare in the [place] today. It causes painful muscle tightening and stiffness, usually all over the body.  It can lead to tightening of muscles in the head and neck so you can't open your mouth, swallow, or sometimes even breathe. Tetanus kills about 1 out of 10 people who are infected even after receiving the best medical care. DIPHTHERIA is also rare in the [place] today. It can cause a thick coating to form in the back of the throat.  It can lead to breathing problems, heart failure, paralysis, and death. PERTUSSIS (Whooping Cough) causes severe coughing spells, which can cause difficulty breathing, vomiting and disturbed sleep.  It can also lead to weight loss, incontinence, and rib fractures. Up to 2 in 100 adolescents and 5 in 100 adults with pertussis are hospitalized or have complications, which could include pneumonia or death. These diseases are caused by bacteria. Diphtheria and pertussis are spread from person to person through secretions from coughing or sneezing. Tetanus enters the body through cuts, scratches, or wounds. Before vaccines, as many as 200,000 cases of diphtheria, 200,000 cases of pertussis, and hundreds of cases of tetanus, were reported in the [place] each year. Since vaccination began, reports of cases for tetanus and diphtheria have dropped by about 99% and for pertussis by about 80%. 2. Tdap vaccine Tdap vaccine can protect adolescents and adults from tetanus, diphtheria, and pertussis. One dose of Tdap is routinely given at age 11 or 12. People who did not get Tdap at that age should get it as soon as possible. Tdap is especially important  for healthcare professionals and anyone having close contact with a baby younger than 12 months. Pregnant women should get a dose of Tdap during every pregnancy, to protect the newborn from pertussis. Infants are most at risk for severe, life-threatening complications from pertussis. Another vaccine, called Td, protects against tetanus and diphtheria, but not pertussis. A Td booster should be given every 10 years. Tdap may be given as one of these boosters if you have never gotten Tdap before. Tdap may also be given after a severe cut or burn to prevent tetanus infection. Your doctor or the person giving you the vaccine can give you more information. Tdap may safely be given at the same time as other vaccines. 3. Some people should not get this vaccine  A person who has ever had a life-threatening allergic reaction after a previous dose of any diphtheria, tetanus or pertussis containing vaccine, OR has a severe allergy to any part of this vaccine, should not get Tdap vaccine. Tell the person giving the vaccine about any severe allergies.  Anyone who had coma or long repeated seizures within 7 days after a childhood dose of DTP or DTaP, or a previous dose of Tdap, should not get Tdap, unless a cause other than the vaccine was found. They can still get Td.  Talk to your doctor if you:  have seizures or another nervous system problem,  had severe pain or swelling after any vaccine containing diphtheria, tetanus or pertussis,  ever had a condition called Guillain-Barr Syndrome (GBS),  aren't feeling well on the day the shot is scheduled. 4. Risks With any medicine, including vaccines, there is   a chance of side effects. These are usually mild and go away on their own. Serious reactions are also possible but are rare. Most people who get Tdap vaccine do not have any problems with it. Mild problems following Tdap (Did not interfere with activities)  Pain where the shot was given (about 3 in 4  adolescents or 2 in 3 adults)  Redness or swelling where the shot was given (about 1 person in 5)  Mild fever of at least 100.4F (up to about 1 in 25 adolescents or 1 in 100 adults)  Headache (about 3 or 4 people in 10)  Tiredness (about 1 person in 3 or 4)  Nausea, vomiting, diarrhea, stomach ache (up to 1 in 4 adolescents or 1 in 10 adults)  Chills, sore joints (about 1 person in 10)  Body aches (about 1 person in 3 or 4)  Rash, swollen glands (uncommon) Moderate problems following Tdap (Interfered with activities, but did not require medical attention)  Pain where the shot was given (up to 1 in 5 or 6)  Redness or swelling where the shot was given (up to about 1 in 16 adolescents or 1 in 12 adults)  Fever over 102F (about 1 in 100 adolescents or 1 in 250 adults)  Headache (about 1 in 7 adolescents or 1 in 10 adults)  Nausea, vomiting, diarrhea, stomach ache (up to 1 or 3 people in 100)  Swelling of the entire arm where the shot was given (up to about 1 in 500). Severe problems following Tdap (Unable to perform usual activities; required medical attention)  Swelling, severe pain, bleeding and redness in the arm where the shot was given (rare). Problems that could happen after any vaccine:  People sometimes faint after a medical procedure, including vaccination. Sitting or lying down for about 15 minutes can help prevent fainting, and injuries caused by a fall. Tell your doctor if you feel dizzy, or have vision changes or ringing in the ears.  Some people get severe pain in the shoulder and have difficulty moving the arm where a shot was given. This happens very rarely.  Any medication can cause a severe allergic reaction. Such reactions from a vaccine are very rare, estimated at fewer than 1 in a million doses, and would happen within a few minutes to a few hours after the vaccination. As with any medicine, there is a very remote chance of a vaccine causing a serious  injury or death. The safety of vaccines is always being monitored. For more information, visit: www.cdc.gov/vaccinesafety/ 5. What if there is a serious problem? What should I look for?  Look for anything that concerns you, such as signs of a severe allergic reaction, very high fever, or unusual behavior.  Signs of a severe allergic reaction can include hives, swelling of the face and throat, difficulty breathing, a fast heartbeat, dizziness, and weakness. These would usually start a few minutes to a few hours after the vaccination. What should I do?  If you think it is a severe allergic reaction or other emergency that can't wait, call 9-1-1 or get the person to the nearest hospital. Otherwise, call your doctor.  Afterward, the reaction should be reported to the Vaccine Adverse Event Reporting System (VAERS). Your doctor might file this report, or you can do it yourself through the VAERS web site at www.vaers.hhs.gov, or by calling 1-800-822-7967. VAERS does not give medical advice.  6. The National Vaccine Injury Compensation Program The National Vaccine Injury Compensation Program (  VICP) is a federal program that was created to compensate people who may have been injured by certain vaccines. Persons who believe they may have been injured by a vaccine can learn about the program and about filing a claim by calling 1-800-338-2382 or visiting the VICP website at www.hrsa.gov/vaccinecompensation. There is a time limit to file a claim for compensation. 7. How can I learn more?  Ask your doctor. He or she can give you the vaccine package insert or suggest other sources of information.  Call your local or state health department.  Contact the Centers for Disease Control and Prevention (CDC):  Call 1-800-232-4636 (1-800-CDC-INFO) or  Visit CDC's website at www.cdc.gov/vaccines CDC Tdap Vaccine VIS (11/08/13)   This information is not intended to replace advice given to you by your health care  provider. Make sure you discuss any questions you have with your health care provider.   Document Released: 03/02/2012 Document Revised: 09/22/2014 Document Reviewed: 12/14/2013 Elsevier Interactive Patient Education 2016 Elsevier Inc.  

## 2016-06-12 NOTE — Telephone Encounter (Signed)
Routing to provider  

## 2016-06-12 NOTE — Progress Notes (Signed)
BP 134/84   Pulse 60   Temp 98.9 F (37.2 C)   Wt 272 lb (123.4 kg)   SpO2 99%   BMI 48.18 kg/m    Subjective:    Patient ID: Lance Kim, male    DOB: 12/31/1970, 45 y.o.   MRN: [id number]  HPI: Lance Kim is a 45 y.o. male  Chief Complaint  Patient presents with  . Headache    started Tuesday with headache. Feels like back of head is swollen. He ran out of BP meds, picking up refill today.  No N&V,  falls, no known trauma. Also noticed some hearing loss in right ear x 3 weeks. not sure if that's related.   Headache- hasn't taken his BP medicine for about a week, does not check it at home Duration: 3-4 days headache, noticed today that he has been having some swelling and having trouble hearing out of his R ear Onset: gradual Severity: moderate Quality: aching, throbbing this morning Frequency: constant Location: forehead and back of his head Headache duration: all day Radiation: no Time of day headache occurs: random Alleviating factors: hasn't tried anything Aggravating factors: eating pork Headache status at time of visit: current headache Treatments attempted: Treatments attempted: none   Aura: no Nausea:  no Vomiting: no Photophobia:  no Phonophobia:  no Effect on social functioning:  no Confusion:  no Gait disturbance/ataxia:  no Behavioral changes:  no Fevers:  no  SKIN INFECTION Duration: This morning Location: back of his head History of trauma in area: no Pain: yes Quality: dull Severity: 4/10 Redness: no Swelling: yes Oozing: no Pus: no Fevers: no Nausea/vomiting: no Status: worse Treatments attempted:none  Tetanus: Not UTD   Relevant past medical, surgical, family and social history reviewed and updated as indicated. Interim medical history since our last visit reviewed. Allergies and medications reviewed and updated.  Review of Systems  Constitutional: Negative.   Respiratory: Negative.   Cardiovascular: Negative.   Skin:  Negative for color change, pallor, rash and wound.  Neurological: Positive for headaches.  Psychiatric/Behavioral: Negative.     Per HPI unless specifically indicated above     Objective:    BP 134/84   Pulse 60   Temp 98.9 F (37.2 C)   Wt 272 lb (123.4 kg)   SpO2 99%   BMI 48.18 kg/m   Wt Readings from Last 3 Encounters:  06/12/16 272 lb (123.4 kg)  04/07/16 256 lb (116.1 kg)  02/05/16 254 lb 9.6 oz (115.5 kg)    Physical Exam  Constitutional: He is oriented to person, place, and time. He appears well-developed and well-nourished. No distress.  HENT:  Head: Normocephalic and atraumatic.  Right Ear: Hearing normal.  Left Ear: Hearing normal.  Nose: Nose normal.  Eyes: Conjunctivae and lids are normal. Right eye exhibits no discharge. Left eye exhibits no discharge. No scleral icterus.  Cardiovascular: Normal rate, regular rhythm, normal heart sounds and intact distal pulses.  Exam reveals no gallop and no friction rub.   No murmur heard. Pulmonary/Chest: Effort normal and breath sounds normal. No respiratory distress. He has no wheezes. He has no rales. He exhibits no tenderness.  Musculoskeletal: Normal range of motion.  Neurological: He is alert and oriented to person, place, and time.  Skin: Skin is warm, dry and intact. No rash noted. No erythema. No pallor.  Swelling at the back of the head. No fluctuance. + heat, mild erythema  Psychiatric: He has a normal mood and affect. His speech is  normal and behavior is normal. Judgment and thought content normal. Cognition and memory are normal.  Nursing note and vitals reviewed.   Results for orders placed or performed in visit on 04/07/16  HIV antibody  Result Value Ref Range   HIV Screen 4th Generation wRfx Non Reactive Non Reactive  CBC with Differential/Platelet  Result Value Ref Range   WBC 6.9 3.4 - 10.8 x10E3/uL   RBC 4.98 4.14 - 5.80 x10E6/uL   Hemoglobin 13.9 12.6 - 17.7 g/dL   Hematocrit 42.0 37.5 - 51.0 %     MCV 84 79 - 97 fL   MCH 27.9 26.6 - 33.0 pg   MCHC 33.1 31.5 - 35.7 g/dL   RDW 14.9 12.3 - 15.4 %   Platelets 259 150 - 379 x10E3/uL   Neutrophils 47 %   Lymphs 42 %   Monocytes 8 %   Eos 3 %   Basos 0 %   Neutrophils Absolute 3.3 1.4 - 7.0 x10E3/uL   Lymphocytes Absolute 2.9 0.7 - 3.1 x10E3/uL   Monocytes Absolute 0.5 0.1 - 0.9 x10E3/uL   EOS (ABSOLUTE) 0.2 0.0 - 0.4 x10E3/uL   Basophils Absolute 0.0 0.0 - 0.2 x10E3/uL   Immature Granulocytes 0 %   Immature Grans (Abs) 0.0 0.0 - 0.1 x10E3/uL  Comprehensive metabolic panel  Result Value Ref Range   Glucose 148 (H) 65 - 99 mg/dL   BUN 10 6 - 24 mg/dL   Creatinine, Ser 1.15 0.76 - 1.27 mg/dL   GFR calc non Af Amer 76 >59 mL/min/1.73   GFR calc Af Amer 88 >59 mL/min/1.73   BUN/Creatinine Ratio 9 9 - 20   Sodium 139 134 - 144 mmol/L   Potassium 4.3 3.5 - 5.2 mmol/L   Chloride 97 96 - 106 mmol/L   CO2 25 18 - 29 mmol/L   Calcium 9.1 8.7 - 10.2 mg/dL   Total Protein 6.9 6.0 - 8.5 g/dL   Albumin 4.2 3.5 - 5.5 g/dL   Globulin, Total 2.7 1.5 - 4.5 g/dL   Albumin/Globulin Ratio 1.6 1.2 - 2.2   Bilirubin Total <0.2 0.0 - 1.2 mg/dL   Alkaline Phosphatase 86 39 - 117 IU/L   AST 22 0 - 40 IU/L   ALT 29 0 - 44 IU/L  Lipid Panel w/o Chol/HDL Ratio  Result Value Ref Range   Cholesterol, Total 125 100 - 199 mg/dL   Triglycerides 190 (H) 0 - 149 mg/dL   HDL 31 (L) >39 mg/dL   VLDL Cholesterol Cal 38 5 - 40 mg/dL   LDL Calculated 56 0 - 99 mg/dL  TSH  Result Value Ref Range   TSH 1.240 0.450 - 4.500 uIU/mL  Microalbumin, Urine Waived  Result Value Ref Range   Microalb, Ur Waived 10 0 - 19 mg/L   Creatinine, Urine Waived 100 10 - 300 mg/dL   Microalb/Creat Ratio <30 <30 mg/g  Uric acid  Result Value Ref Range   Uric Acid 7.1 3.7 - 8.6 mg/dL      Assessment & Plan:   Problem List Items Addressed This Visit      Cardiovascular and Mediastinum   Benign essential HTN    Refills of his medicine given today. If headache does  not resolve with taking medicine, let us know.       Relevant Medications   aspirin EC 81 MG tablet   amLODipine (NORVASC) 10 MG tablet   lisinopril-hydrochlorothiazide (PRINZIDE,ZESTORETIC) 20-12.5 MG tablet   atorvastatin (LIPITOR) 20 MG tablet  Other Visit Diagnoses    Cellulitis of head except face    -  Primary   No fluctuance. Will treat with bactrim and hot compresses. Let us know if not getting better or getting worse.    Immunization due       Tdap given today.   Relevant Orders   Tdap vaccine greater than or equal to 7yo IM       Follow up plan: No Follow-up on file.

## 2016-06-12 NOTE — Assessment & Plan Note (Signed)
Refills of his medicine given today. If headache does not resolve with taking medicine, let us know.

## 2016-08-10 ENCOUNTER — Other Ambulatory Visit: Payer: Self-pay | Admitting: Unknown Physician Specialty

## 2016-09-12 ENCOUNTER — Other Ambulatory Visit: Payer: Self-pay | Admitting: Family Medicine

## 2016-09-16 NOTE — Telephone Encounter (Signed)
Your patient 

## 2016-09-29 ENCOUNTER — Other Ambulatory Visit: Payer: Self-pay | Admitting: Unknown Physician Specialty

## 2016-10-08 ENCOUNTER — Encounter: Payer: Self-pay | Admitting: Unknown Physician Specialty

## 2016-10-08 ENCOUNTER — Ambulatory Visit (INDEPENDENT_AMBULATORY_CARE_PROVIDER_SITE_OTHER): Payer: 59 | Admitting: Unknown Physician Specialty

## 2016-10-08 VITALS — BP 123/83 | HR 80 | Temp 98.0°F | Ht 64.3 in | Wt 275.6 lb

## 2016-10-08 DIAGNOSIS — R05 Cough: Secondary | ICD-10-CM | POA: Diagnosis not present

## 2016-10-08 DIAGNOSIS — Z6841 Body Mass Index (BMI) 40.0 and over, adult: Secondary | ICD-10-CM

## 2016-10-08 DIAGNOSIS — E785 Hyperlipidemia, unspecified: Secondary | ICD-10-CM | POA: Diagnosis not present

## 2016-10-08 DIAGNOSIS — IMO0001 Reserved for inherently not codable concepts without codable children: Secondary | ICD-10-CM

## 2016-10-08 DIAGNOSIS — E669 Obesity, unspecified: Secondary | ICD-10-CM | POA: Diagnosis not present

## 2016-10-08 DIAGNOSIS — R059 Cough, unspecified: Secondary | ICD-10-CM

## 2016-10-08 DIAGNOSIS — F32A Depression, unspecified: Secondary | ICD-10-CM | POA: Insufficient documentation

## 2016-10-08 DIAGNOSIS — I10 Essential (primary) hypertension: Secondary | ICD-10-CM

## 2016-10-08 DIAGNOSIS — F329 Major depressive disorder, single episode, unspecified: Secondary | ICD-10-CM

## 2016-10-08 MED ORDER — FLUOXETINE HCL 20 MG PO TABS: 20.0000 mg | ORAL_TABLET | Freq: Every day | ORAL | 1 refills | Status: DC

## 2016-10-08 NOTE — Assessment & Plan Note (Signed)
Stable. Will continue on current medication.

## 2016-10-08 NOTE — Assessment & Plan Note (Signed)
Discussed. Patient agreed to work on diet and exercise.

## 2016-10-08 NOTE — Assessment & Plan Note (Signed)
Will continue on current medication. Lipid panel will be drawn at next visit.

## 2016-10-08 NOTE — Assessment & Plan Note (Signed)
Medication pricing discussed and resolved. Patient will resume fluoxetine.

## 2016-10-08 NOTE — Progress Notes (Addendum)
BP 123/83 (BP Location: Left Arm, Patient Position: Sitting, Cuff Size: Large)   Pulse 80   Temp 98 F (36.7 C)   Ht 5' 4.3" (1.633 m)   Wt 275 lb 9.6 oz (125 kg)   SpO2 97%   BMI 46.87 kg/m    Subjective:    Patient ID: Lance Kim, male    DOB: 1971/09/15, 46 y.o.   MRN: [id number]  HPI: Lance Kim is a 46 y.o. male  Chief Complaint  Patient presents with  . Hyperlipidemia  . Hypertension   Hypertension Taking medication without difficulties. Does not check blood pressures at home. Denies vision changes, headaches, light headedness, edema, chest pain or shortness of breath.  Depression Patient notes he has not been taking his fluoxetine for about a month due to increase in price. Reports he has been experiencing some eye twitching since stopping this medication, but denies any other problems.   Depression screen [facility] 2/9 10/08/2016 04/07/2016 08/27/2015  Decreased Interest 2 0 0  Down, Depressed, Hopeless 0 0 0  PHQ - 2 Score 2 0 0  Altered sleeping 0 - -  Tired, decreased energy 1 - -  Change in appetite 1 - -  Feeling bad or failure about yourself  0 - -  Trouble concentrating 0 - -  Moving slowly or fidgety/restless 0 - -  Suicidal thoughts 0 - -  PHQ-9 Score 4 - -    Hyperlipidemia  Taking medication without difficulties. Denies any muscle pain. States he has not been eating very healthy or exercising, but agrees to start working on both.  URI Reports some mild cold symptoms including a a cough and some sneezing. He has been taking coricidin for relief.   Relevant past medical, surgical, family and social history reviewed and updated as indicated. Interim medical history since our last visit reviewed. Allergies and medications reviewed and updated.  Review of Systems  Constitutional: Negative for appetite change, fatigue and unexpected weight change.  HENT: Positive for sneezing. Negative for congestion, ear pain and sore throat.   Eyes: Negative for  visual disturbance.  Respiratory: Positive for cough. Negative for chest tightness and shortness of breath.   Cardiovascular: Negative for chest pain, palpitations and leg swelling.  Gastrointestinal: Negative for abdominal pain, constipation, diarrhea and nausea.  Musculoskeletal: Negative for arthralgias, joint swelling and myalgias.  Skin: Positive for rash.       Psoriasis.  Neurological: Negative for dizziness, light-headedness and headaches.  Psychiatric/Behavioral: Negative.     Per HPI unless specifically indicated above     Objective:    BP 123/83 (BP Location: Left Arm, Patient Position: Sitting, Cuff Size: Large)   Pulse 80   Temp 98 F (36.7 C)   Ht 5' 4.3" (1.633 m)   Wt 275 lb 9.6 oz (125 kg)   SpO2 97%   BMI 46.87 kg/m   Wt Readings from Last 3 Encounters:  10/08/16 275 lb 9.6 oz (125 kg)  06/12/16 272 lb (123.4 kg)  04/07/16 256 lb (116.1 kg)    Physical Exam  Constitutional: He is oriented to person, place, and time. He appears well-developed and well-nourished. No distress.  HENT:  Head: Normocephalic and atraumatic.  Eyes: Conjunctivae are normal. Right eye exhibits no discharge. Left eye exhibits no discharge. No scleral icterus.  Cardiovascular: Normal rate, regular rhythm and normal heart sounds.   Pulmonary/Chest: Breath sounds normal. No respiratory distress. He has no wheezes. He has no rales.  Neurological: He is alert  and oriented to person, place, and time.  Skin: Skin is warm and dry. Rash noted. He is not diaphoretic.  Psoriatic rash.  Psychiatric: He has a normal mood and affect. His behavior is normal. Judgment and thought content normal.  Nursing note and vitals reviewed.     Assessment & Plan:   Problem List Items Addressed This Visit      Unprioritized   Benign essential HTN    Stable. Will continue on current medication.      Depression    Medication pricing discussed and resolved. Patient will resume fluoxetine.        Relevant Medications   FLUoxetine (PROZAC) 20 MG tablet   Hyperlipemia    Will continue on current medication. Lipid panel will be drawn at next visit.       Obesity    Discussed. Patient agreed to work on diet and exercise.       Other Visit Diagnoses    Cough    -  Primary   Patient will continue with OTC coricidin as needed. Encouraged to drink plenty of fluids.        Follow up plan: Return in about 6 months (around 04/07/2017) for 6 months f/u.

## 2016-10-24 ENCOUNTER — Ambulatory Visit (INDEPENDENT_AMBULATORY_CARE_PROVIDER_SITE_OTHER): Payer: 59 | Admitting: Unknown Physician Specialty

## 2016-10-24 ENCOUNTER — Encounter: Payer: Self-pay | Admitting: Unknown Physician Specialty

## 2016-10-24 ENCOUNTER — Encounter: Payer: Self-pay | Admitting: Emergency Medicine

## 2016-10-24 ENCOUNTER — Emergency Department
Admission: EM | Admit: 2016-10-24 | Discharge: 2016-10-24 | Disposition: A | Discharge status: Home / Self Care | Payer: Commercial Managed Care - HMO | Attending: Emergency Medicine | Admitting: Emergency Medicine

## 2016-10-24 ENCOUNTER — Emergency Department: Payer: Commercial Managed Care - HMO

## 2016-10-24 VITALS — BP 138/85 | HR 66 | Temp 98.5°F | Wt 263.6 lb

## 2016-10-24 DIAGNOSIS — R319 Hematuria, unspecified: Secondary | ICD-10-CM | POA: Diagnosis not present

## 2016-10-24 DIAGNOSIS — R1031 Right lower quadrant pain: Secondary | ICD-10-CM | POA: Diagnosis not present

## 2016-10-24 DIAGNOSIS — I10 Essential (primary) hypertension: Secondary | ICD-10-CM | POA: Insufficient documentation

## 2016-10-24 DIAGNOSIS — R109 Unspecified abdominal pain: Secondary | ICD-10-CM | POA: Diagnosis not present

## 2016-10-24 DIAGNOSIS — R309 Painful micturition, unspecified: Secondary | ICD-10-CM | POA: Diagnosis not present

## 2016-10-24 DIAGNOSIS — Z7982 Long term (current) use of aspirin: Secondary | ICD-10-CM | POA: Diagnosis not present

## 2016-10-24 DIAGNOSIS — Z79899 Other long term (current) drug therapy: Secondary | ICD-10-CM | POA: Diagnosis not present

## 2016-10-24 DIAGNOSIS — F17210 Nicotine dependence, cigarettes, uncomplicated: Secondary | ICD-10-CM | POA: Insufficient documentation

## 2016-10-24 LAB — CBC
HCT: 42.9 % (ref 40.0–52.0)
HEMOGLOBIN: 14.6 g/dL (ref 13.0–18.0)
MCH: 28.7 pg (ref 26.0–34.0)
MCHC: 34.1 g/dL (ref 32.0–36.0)
MCV: 84 fL (ref 80.0–100.0)
Platelets: 279 10*3/uL (ref 150–440)
RBC: 5.1 MIL/uL (ref 4.40–5.90)
RDW: 13.8 % (ref 11.5–14.5)
WBC: 10.5 10*3/uL (ref 3.8–10.6)

## 2016-10-24 LAB — BASIC METABOLIC PANEL
ANION GAP: 11 (ref 5–15)
BUN: 9 mg/dL (ref 6–20)
CALCIUM: 9.7 mg/dL (ref 8.9–10.3)
CO2: 24 mmol/L (ref 22–32)
Chloride: 102 mmol/L (ref 101–111)
Creatinine, Ser: 1.13 mg/dL (ref 0.61–1.24)
GFR calc Af Amer: >60 mL/min (ref >60)
GLUCOSE: 114 mg/dL — AB (ref 65–99)
Potassium: 3.7 mmol/L (ref 3.5–5.1)
Sodium: 137 mmol/L (ref 135–145)

## 2016-10-24 LAB — URINALYSIS, COMPLETE (UACMP) WITH MICROSCOPIC
Bacteria, UA: NONE SEEN
Bilirubin Urine: NEGATIVE
GLUCOSE, UA: NEGATIVE mg/dL
KETONES UR: 20 mg/dL — AB
Nitrite: NEGATIVE
PROTEIN: NEGATIVE mg/dL
Specific Gravity, Urine: 1.014 (ref 1.005–1.030)
pH: 6 (ref 5.0–8.0)

## 2016-10-24 MED ORDER — IBUPROFEN 400 MG PO TABS: 400.0000 mg | ORAL_TABLET | Freq: Four times a day (QID) | ORAL | 0 refills | Status: DC | PRN

## 2016-10-24 MED ORDER — ONDANSETRON 4 MG PO TBDP
ORAL_TABLET | ORAL | Status: AC
  Administered 2016-10-24: 4 mg via ORAL
  Filled 2016-10-24: qty 1

## 2016-10-24 MED ORDER — IBUPROFEN 400 MG PO TABS
600.0000 mg | ORAL_TABLET | Freq: Once | ORAL | Status: AC
  Administered 2016-10-24: 600 mg via ORAL
  Filled 2016-10-24: qty 2

## 2016-10-24 MED ORDER — ONDANSETRON 4 MG PO TBDP
4.0000 mg | ORAL_TABLET | Freq: Once | ORAL | Status: AC
  Administered 2016-10-24: 4 mg via ORAL
  Filled 2016-10-24: qty 1

## 2016-10-24 MED ORDER — ONDANSETRON HCL 4 MG PO TABS: 4.0000 mg | ORAL_TABLET | Freq: Three times a day (TID) | ORAL | 0 refills | Status: DC | PRN

## 2016-10-24 MED ORDER — CIPROFLOXACIN HCL 500 MG PO TABS: 500.0000 mg | ORAL_TABLET | Freq: Two times a day (BID) | ORAL | 0 refills | Status: AC

## 2016-10-24 NOTE — ED Triage Notes (Signed)
Started with right flank pain today and hematuria. Denies fevers. NAD in triage. Skin warm and dry. Respiration unlabored.

## 2016-10-24 NOTE — ED Provider Notes (Signed)
Time Seen: Approximately *2009  I have reviewed the triage notes  Chief Complaint: Flank Pain   History of Present Illness: Lance Kim is a 46 y.o. male *states he's had a long history of low back pain with the [place]. He states he's developed some right flank pain just seemed to from the chronic low back discomfort. This pain is constant and he points mainly to the right and states that he is right sided groin and testicular pain. He states it hurts to urinate. He describes some mild  hematuria.Lance Kim He describes no significant anterior abdominal pain.   Past Medical History:  Diagnosis Date  . Anxiety   . Depression   . Hyperlipidemia   . Hypertension   . Hypertensive renal disease without failure   . Tobacco use disorder     Patient Active Problem List   Diagnosis Date Noted  . Depression 10/08/2016  . Psoriasis 04/07/2016  . Allergic rhinitis 02/23/2015  . Low back pain 02/23/2015  . Hyperlipemia 12/28/2014  . Benign essential HTN 12/28/2014  . Obesity 12/28/2014  . Hypertensive CKD (chronic kidney disease) 12/28/2014  . Tobacco use disorder 12/28/2014    Past Surgical History:  Procedure Laterality Date  . WISDOM TOOTH EXTRACTION      Past Surgical History:  Procedure Laterality Date  . WISDOM TOOTH EXTRACTION      Current Outpatient Rx  . Order #: [id number] Class: Normal  . Order #: [id number] Class: Historical Med  . Order #: [id number] Class: Historical Med  . Order #: [id number] Class: Normal  . Order #: [id number] Class: Print  . Order #: [id number] Class: Historical Med  . Order #: [id number] Class: Normal  . Order #: [id number] Class: Historical Med  . Order #: [id number] Class: Print  . Order #: [id number] Class: Normal  . Order #: [id number] Class: Normal  . Order #: [id number] Class: Historical Med  . Order #: [id number] Class: Print  . Order #: [id number] Class: Historical Med    Allergies:  Mevacor [lovastatin]  Family History: Family History  Problem  Relation Age of Onset  . Diabetes Mother   . Emphysema Father   . Cancer Son   . Cancer Maternal Grandmother     Social History: Social History  Substance Use Topics  . Smoking status: Current Every Day Smoker    Packs/day: 0.50    Types: Cigarettes  . Smokeless tobacco: Never Used  . Alcohol use 0.6 - 1.2 oz/week    1 - 2 Standard drinks or equivalent per week     Comment: on occasion     Review of Systems:   10 point review of systems was performed and was otherwise negative:  Constitutional: No fever Eyes: No visual disturbances ENT: No sore throat, ear pain Cardiac: No chest pain Respiratory: No shortness of breath, wheezing, or stridor Abdomen: No abdominal pain, no vomiting, No diarrhea Endocrine: No weight loss, No night sweats Extremities: No peripheral edema, cyanosis Skin: No rashes, easy bruising Neurologic: No focal weakness, trouble with speech or swollowing Urologic: Patient states it hurts to urinate and is noticed some small amount of blood in his urine. He is not sure if the blood is all throughout the stream floors at the middle or end of his urination. He denies any difficulty initiating urination.   Physical Exam:  ED Triage Vitals  Enc Vitals Group     BP 10/24/16 1725 121/84     Pulse Rate 10/24/16 1724 84     Resp 10/24/16 1724 18     Temp 10/24/16 1724 98.1  F (36.7 C)     Temp Source 10/24/16 1724 Oral     SpO2 10/24/16 1724 99 %     Weight 10/24/16 1724 260 lb (117.9 kg)     Height 10/24/16 1724 5\' 4"  (1.626 m)     Head Circumference --      Peak Flow --      Pain Score 10/24/16 1725 8     Pain Loc --      Pain Edu? --      Excl. in [place]? --     General: Awake , Alert , and Oriented times 3; GCS 15 Head: Normal cephalic , atraumatic Eyes: Pupils equal , round, reactive to light Nose/Throat: No nasal drainage, patent upper airway without erythema or exudate.  Neck: Supple, Full range of motion, No anterior adenopathy or palpable  thyroid masses Lungs: Clear to ascultation without wheezes , rhonchi, or rales Heart: Regular rate, regular rhythm without murmurs , gallops , or rubs Abdomen: Soft, non tender without rebound, guarding , or rigidity; bowel sounds positive and symmetric in all 4 quadrants. No organomegaly .        Extremities: 2 plus symmetric pulses. No edema, clubbing or cyanosis Neurologic: normal ambulation, Motor symmetric without deficits, sensory intact Skin: warm, dry, no rashes  Examination shows no reproducible testicular pain or masses noted. Normal position of the testes with no signs of inguinal hernia. There is no penile discharge or drainage  Labs:   All laboratory work was reviewed including any pertinent negatives or positives listed below:  Labs Reviewed  URINALYSIS, COMPLETE (UACMP) WITH MICROSCOPIC - Abnormal; Notable for the following:       Result Value   Color, Urine YELLOW (*)    APPearance CLEAR (*)    Hgb urine dipstick LARGE (*)    Ketones, ur 20 (*)    Leukocytes, UA SMALL (*)    Squamous Epithelial / LPF 0-5 (*)    All other components within normal limits  BASIC METABOLIC PANEL - Abnormal; Notable for the following:    Glucose, Bld 114 (*)    All other components within normal limits  CBC   Patient does appear to have blood in his urine  Radiology: "Ct Renal Stone Study  Result Date: 10/24/2016 CLINICAL DATA:  RIGHT flank pain, hematuria. History of hypertension, hyperlipidemia, chronic kidney disease. EXAM: CT ABDOMEN AND PELVIS WITHOUT CONTRAST TECHNIQUE: Multidetector CT imaging of the abdomen and pelvis was performed following the standard protocol without IV contrast. COMPARISON:  None. FINDINGS: LOWER CHEST: Lung bases are clear. The visualized heart size is normal. No pericardial effusion. HEPATOBILIARY: Normal. PANCREAS: Normal. SPLEEN: Normal. ADRENALS/URINARY TRACT: Kidneys are orthotopic, demonstrating normal size and morphology. No nephrolithiasis,  hydronephrosis; limited assessment for renal masses on this nonenhanced examination. The unopacified ureters are normal in course and caliber. Urinary bladder is partially distended and unremarkable. Mildly thickened adrenal glands associated with hyperplasia. STOMACH/BOWEL: The stomach, small and large bowel are normal in course and caliber without inflammatory changes, sensitivity decreased by lack of enteric contrast. Normal appendix. VASCULAR/LYMPHATIC: Aortoiliac vessels are normal in course and caliber, mild calcific atherosclerosis. No lymphadenopathy by CT size criteria. REPRODUCTIVE: Normal. OTHER: No intraperitoneal free fluid or free air. MUSCULOSKELETAL: Non-acute. Severe L4-5 facet arthropathy. Small T9-10 broad-based disc osteophyte complex. Small fat containing umbilical hernia. IMPRESSION: No urolithiasis, obstructive uropathy nor acute intra-abdominal/pelvic process. Mild atherosclerosis. Electronically Signed   By: Elon Alas M.D.   On: 10/24/2016 20:31  "  I personally  reviewed the radiologic studies    ED Course: * Patient was given medication here in emergency department resolution majority of his discomfort. I was not sure of the exact source of his hematuria. Because it hurts to urinate I felt that he may have a kidney stone though CAT scan evaluation did not show any significant findings for renal colic. The patient was advised to return here if he develops a fever and was referred to urology unassigned. I prescribed him antibiotics if this might be some form of prostatitis.  is able to ambulate and did not seem to have any indications of cauda equina syndrome with his back pain   Final Clinical Impression:  Final diagnoses:  Right flank pain  Hematuria, unspecified type     Plan:  Outpatient " Discharge Medication List as of 10/24/2016  9:27 PM    START taking these medications   Details  ciprofloxacin (CIPRO) 500 MG tablet Take 1 tablet (500 mg total) by  mouth 2 (two) times daily., Starting Fri 10/24/2016, Until Fri 10/31/2016, Print    ibuprofen (ADVIL,MOTRIN) 400 MG tablet Take 1 tablet (400 mg total) by mouth every 6 (six) hours as needed., Starting Fri 10/24/2016, Print    ondansetron (ZOFRAN) 4 MG tablet Take 1 tablet (4 mg total) by mouth every 8 (eight) hours as needed for nausea or vomiting., Starting Fri 10/24/2016, Print      " Patient was advised to return immediately if condition worsens. Patient was advised to follow up with their primary care physician or other specialized physicians involved in their outpatient care. The patient and/or family member/power of attorney had laboratory results reviewed at the bedside. All questions and concerns were addressed and appropriate discharge instructions were distributed by the nursing staff.             Daymon Larsen, MD 10/24/16 [phone]

## 2016-10-24 NOTE — Progress Notes (Signed)
BP 138/85 (BP Location: Left Arm, Patient Position: Sitting, Cuff Size: Large)   Pulse 66   Temp 98.5 F (36.9 C)   Wt 263 lb 9.6 oz (119.6 kg)   SpO2 97%   BMI 44.83 kg/m    Subjective:    Patient ID: Lance Kim, male    DOB: 16-Aug-1971, 46 y.o.   MRN: [id number]  HPI: Carless Fabris is a 46 y.o. male  Chief Complaint  Patient presents with  . Hematuria    pt states that he noticied some blood in his urine bother times he urinated today. States it is also extremely painful to urinate.    Pt with complaints of pain and blood in urine.  States he "pees a little bit and then blood starts coming out "  States the pain was so bad he almost passed out twice and took 15-20 minutes to stop sweating.    This started this AM and has persisted.  Complaining of back pain and testicular discomfort.    Relevant past medical, surgical, family and social history reviewed and updated as indicated. Interim medical history since our last visit reviewed. Allergies and medications reviewed and updated.  Review of Systems  Per HPI unless specifically indicated above     Objective:    BP 138/85 (BP Location: Left Arm, Patient Position: Sitting, Cuff Size: Large)   Pulse 66   Temp 98.5 F (36.9 C)   Wt 263 lb 9.6 oz (119.6 kg)   SpO2 97%   BMI 44.83 kg/m   Wt Readings from Last 3 Encounters:  10/24/16 263 lb 9.6 oz (119.6 kg)  10/08/16 275 lb 9.6 oz (125 kg)  06/12/16 272 lb (123.4 kg)    Physical Exam  Constitutional: He is oriented to person, place, and time. He appears well-developed and well-nourished. No distress.  HENT:  Head: Normocephalic and atraumatic.  Eyes: Conjunctivae and lids are normal. Right eye exhibits no discharge. Left eye exhibits no discharge. No scleral icterus.  Neck: Normal range of motion. Neck supple. No JVD present. Carotid bruit is not present.  Cardiovascular: Normal rate, regular rhythm and normal heart sounds.   Pulmonary/Chest: Effort normal  and breath sounds normal. No respiratory distress.  Abdominal: Normal appearance. There is no splenomegaly or hepatomegaly.  Genitourinary:  Genitourinary Comments: Unable to palpate prostate due to size  Musculoskeletal: Normal range of motion.  Neurological: He is alert and oriented to person, place, and time.  Skin: Skin is warm, dry and intact. No rash noted. No pallor.  Psychiatric: He has a normal mood and affect. His behavior is normal. Judgment and thought content normal.    Results for orders placed or performed in visit on 04/07/16  HIV antibody  Result Value Ref Range   HIV Screen 4th Generation wRfx Non Reactive Non Reactive  CBC with Differential/Platelet  Result Value Ref Range   WBC 6.9 3.4 - 10.8 x10E3/uL   RBC 4.98 4.14 - 5.80 x10E6/uL   Hemoglobin 13.9 12.6 - 17.7 g/dL   Hematocrit 42.0 37.5 - 51.0 %   MCV 84 79 - 97 fL   MCH 27.9 26.6 - 33.0 pg   MCHC 33.1 31.5 - 35.7 g/dL   RDW 14.9 12.3 - 15.4 %   Platelets 259 150 - 379 x10E3/uL   Neutrophils 47 %   Lymphs 42 %   Monocytes 8 %   Eos 3 %   Basos 0 %   Neutrophils Absolute 3.3 1.4 - 7.0 x10E3/uL  Lymphocytes Absolute 2.9 0.7 - 3.1 x10E3/uL   Monocytes Absolute 0.5 0.1 - 0.9 x10E3/uL   EOS (ABSOLUTE) 0.2 0.0 - 0.4 x10E3/uL   Basophils Absolute 0.0 0.0 - 0.2 x10E3/uL   Immature Granulocytes 0 %   Immature Grans (Abs) 0.0 0.0 - 0.1 x10E3/uL  Comprehensive metabolic panel  Result Value Ref Range   Glucose 148 (H) 65 - 99 mg/dL   BUN 10 6 - 24 mg/dL   Creatinine, Ser 1.15 0.76 - 1.27 mg/dL   GFR calc non Af Amer 76 >59 mL/min/1.73   GFR calc Af Amer 88 >59 mL/min/1.73   BUN/Creatinine Ratio 9 9 - 20   Sodium 139 134 - 144 mmol/L   Potassium 4.3 3.5 - 5.2 mmol/L   Chloride 97 96 - 106 mmol/L   CO2 25 18 - 29 mmol/L   Calcium 9.1 8.7 - 10.2 mg/dL   Total Protein 6.9 6.0 - 8.5 g/dL   Albumin 4.2 3.5 - 5.5 g/dL   Globulin, Total 2.7 1.5 - 4.5 g/dL   Albumin/Globulin Ratio 1.6 1.2 - 2.2   Bilirubin  Total <0.2 0.0 - 1.2 mg/dL   Alkaline Phosphatase 86 39 - 117 IU/L   AST 22 0 - 40 IU/L   ALT 29 0 - 44 IU/L  Lipid Panel w/o Chol/HDL Ratio  Result Value Ref Range   Cholesterol, Total 125 100 - 199 mg/dL   Triglycerides 190 (H) 0 - 149 mg/dL   HDL 31 (L) >39 mg/dL   VLDL Cholesterol Cal 38 5 - 40 mg/dL   LDL Calculated 56 0 - 99 mg/dL  TSH  Result Value Ref Range   TSH 1.240 0.450 - 4.500 uIU/mL  Microalbumin, Urine Waived  Result Value Ref Range   Microalb, Ur Waived 10 0 - 19 mg/L   Creatinine, Urine Waived 100 10 - 300 mg/dL   Microalb/Creat Ratio <30 <30 mg/g  Uric acid  Result Value Ref Range   Uric Acid 7.1 3.7 - 8.6 mg/dL      Assessment & Plan:   Problem List Items Addressed This Visit    None    Visit Diagnoses    Pain with urination    -  Primary   Pt unable to urinate due to severe pain.  Will refer to the ER.         Follow up plan: Return if symptoms worsen or fail to improve.

## 2016-10-24 NOTE — ED Notes (Signed)
FIRST NURSE NOTE: Pt from [place] urinating.

## 2016-10-24 NOTE — Discharge Instructions (Signed)
Please return immediately if condition worsens. Please contact her primary physician or the physician you were given for referral. If you have any specialist physicians involved in her treatment and plan please also contact them. Thank you for using [facility]. ° °

## 2016-10-27 ENCOUNTER — Telehealth: Payer: Self-pay | Admitting: Unknown Physician Specialty

## 2016-10-27 DIAGNOSIS — R339 Retention of urine, unspecified: Secondary | ICD-10-CM | POA: Insufficient documentation

## 2016-10-27 DIAGNOSIS — R300 Dysuria: Secondary | ICD-10-CM

## 2016-10-27 NOTE — Telephone Encounter (Signed)
Patient called to see if his insurance requires him to have an auth from his PCP to see a specialist and if we could refer him to a local urologist.  Lance Kim [phone]  Thanks

## 2016-10-27 NOTE — Telephone Encounter (Signed)
Routing to provider  

## 2016-10-29 ENCOUNTER — Encounter: Payer: Self-pay | Admitting: Urology

## 2016-10-29 ENCOUNTER — Ambulatory Visit (INDEPENDENT_AMBULATORY_CARE_PROVIDER_SITE_OTHER): Payer: 59 | Admitting: Urology

## 2016-10-29 VITALS — BP 122/78 | HR 69 | Ht 64.0 in | Wt 267.0 lb

## 2016-10-29 DIAGNOSIS — R300 Dysuria: Secondary | ICD-10-CM | POA: Diagnosis not present

## 2016-10-29 DIAGNOSIS — R310 Gross hematuria: Secondary | ICD-10-CM

## 2016-10-29 LAB — MICROSCOPIC EXAMINATION: Bacteria, UA: NONE SEEN

## 2016-10-29 LAB — URINALYSIS, COMPLETE
BILIRUBIN UA: NEGATIVE
GLUCOSE, UA: NEGATIVE
Ketones, UA: NEGATIVE
Leukocytes, UA: NEGATIVE
Nitrite, UA: NEGATIVE
Specific Gravity, UA: 1.025 (ref 1.005–1.030)
Urobilinogen, Ur: 0.2 mg/dL (ref 0.2–1.0)
pH, UA: 6 (ref 5.0–7.5)

## 2016-10-29 NOTE — Progress Notes (Signed)
10/29/2016 11:44 AM   Lance Kim September 04, 1971 [id number]  Referring provider: Kathrine Haddock, NP [place], [place] [address]  Chief Complaint  Patient presents with  . Hematuria    New Patient  . Dysuria    HPI: I was consulted to assist the patient who had blood in the urine last Friday with some vague mild dysuria the day before. He was drinking a lot of fluids which may caused his increased frequency. He was given ciprofloxacin and Azo-Standard and his symptoms have normalized.  He takes daily aspirin but no blood thinners. He smokes. He might have had a stone 4 years ago. He has nonspecific right low back pain and right hip pain  The patient had a CT scan stone protocol that was normal  He denies a history of previous GU surgery and urinary tract infections and he has no neurologic issues  Modifying factors: There are no other modifying factors  Associated signs and symptoms: There are no other associated signs and symptoms Aggravating and relieving factors: There are no other aggravating or relieving factors Severity: Moderate Duration: Persistent- no    PMH: Past Medical History:  Diagnosis Date  . Anxiety   . Depression   . Hyperlipidemia   . Hypertension   . Hypertensive renal disease without failure   . Tobacco use disorder     Surgical History: Past Surgical History:  Procedure Laterality Date  . WISDOM TOOTH EXTRACTION      Home Medications:  Allergies as of 10/29/2016      Reactions   Mevacor [lovastatin] Other (See Comments)   CRAMPS      Medication List       Accurate as of 10/29/16 11:44 AM. Always use your most recent med list.          amLODipine 10 MG tablet Commonly known as:  NORVASC TAKE 1 TABLET(10 MG) BY MOUTH DAILY   Apremilast 30 MG Tabs Take 30 mg by mouth 2 (two) times daily.   aspirin EC 81 MG tablet Take 81 mg by mouth daily.   atorvastatin 20 MG tablet Commonly known as:  LIPITOR TAKE 1 TABLET BY MOUTH EVERY  DAY   AZO-STANDARD PO Take by mouth.   ciprofloxacin 500 MG tablet Commonly known as:  CIPRO Take 1 tablet (500 mg total) by mouth 2 (two) times daily.   FLAX SEED OIL PO Take by mouth.   FLUoxetine 20 MG tablet Commonly known as:  PROZAC Take 1 tablet (20 mg total) by mouth daily.   HALOG 0.1 % Crea Generic drug:  Halcinonide Apply topically 2 (two) times daily.   ibuprofen 400 MG tablet Commonly known as:  ADVIL,MOTRIN Take 1 tablet (400 mg total) by mouth every 6 (six) hours as needed.   lisinopril-hydrochlorothiazide 20-12.5 MG tablet Commonly known as:  PRINZIDE,ZESTORETIC TAKE 1 TABLET BY MOUTH DAILY   meloxicam 7.5 MG tablet Commonly known as:  MOBIC Take 1 tablet (7.5 mg total) by mouth daily. TAKE 2 TABLETS DAILY   multivitamin tablet Take 1 tablet by mouth daily.   triamcinolone cream 0.5 % Commonly known as:  KENALOG Apply 1 application topically 2 (two) times daily.       Allergies:  Allergies  Allergen Reactions  . Mevacor [Lovastatin] Other (See Comments)    CRAMPS    Family History: Family History  Problem Relation Age of Onset  . Diabetes Mother   . Emphysema Father   . Cancer Son   . Cancer Maternal Grandmother   .  Prostate cancer Neg Hx   . Bladder Cancer Neg Hx   . Kidney cancer Neg Hx     Social History:  reports that he has been smoking Cigarettes.  He has been smoking about 0.50 packs per day. He has never used smokeless tobacco. He reports that he drinks about 0.6 - 1.2 oz of alcohol per week . He reports that he does not use drugs.  ROS: UROLOGY Frequent Urination?: No Hard to postpone urination?: No Burning/pain with urination?: Yes Get up at night to urinate?: Yes Leakage of urine?: No Urine stream starts and stops?: No Trouble starting stream?: No Do you have to strain to urinate?: No Blood in urine?: Yes Urinary tract infection?: No Sexually transmitted disease?: No Injury to kidneys or bladder?: No Painful  intercourse?: No Weak stream?: No Erection problems?: No Penile pain?: Yes  Gastrointestinal Nausea?: No Vomiting?: No Indigestion/heartburn?: No Diarrhea?: No Constipation?: No  Constitutional Fever: No Night sweats?: No Weight loss?: Yes Fatigue?: No  Skin Skin rash/lesions?: Yes Itching?: Yes  Eyes Blurred vision?: No Double vision?: No  Ears/Nose/Throat Sore throat?: No Sinus problems?: No  Hematologic/Lymphatic Swollen glands?: No Easy bruising?: No  Cardiovascular Leg swelling?: No Chest pain?: No  Respiratory Cough?: Yes Shortness of breath?: No  Endocrine Excessive thirst?: No  Musculoskeletal Back pain?: Yes Joint pain?: Yes  Neurological Headaches?: Yes Dizziness?: No  Psychologic Depression?: Yes Anxiety?: Yes  Physical Exam: BP 122/78   Pulse 69   Ht 5\' 4"  (1.626 m)   Wt 121.1 kg (267 lb)   BMI 45.83 kg/m   Constitutional:  Alert and oriented, No acute distress. HEENT: [place] AT, moist mucus membranes.  Trachea midline, no masses. Cardiovascular: No clubbing, cyanosis, or edema. Respiratory: Normal respiratory effort, no increased work of breathing. GI: Abdomen is soft, nontender, nondistended, no abdominal masses GU: No CVA tenderness. Male genitalia normal; prostate felt normal but difficult to examine Skin: No rashes, bruises or suspicious lesions. Lymph: No cervical or inguinal adenopathy. Neurologic: Grossly intact, no focal deficits, moving all 4 extremities. Psychiatric: Normal mood and affect.  Laboratory Data: Lab Results  Component Value Date   WBC 10.5 10/24/2016   HGB 14.6 10/24/2016   HCT 42.9 10/24/2016   MCV 84.0 10/24/2016   PLT 279 10/24/2016    Lab Results  Component Value Date   CREATININE 1.13 10/24/2016    No results found for: PSA  No results found for: TESTOSTERONE  No results found for: HGBA1C  Urinalysis    Component Value Date/Time   COLORURINE YELLOW (A) 10/24/2016 1727    APPEARANCEUR CLEAR (A) 10/24/2016 1727   LABSPEC 1.014 10/24/2016 1727   PHURINE 6.0 10/24/2016 1727   GLUCOSEU NEGATIVE 10/24/2016 1727   HGBUR LARGE (A) 10/24/2016 1727   BILIRUBINUR NEGATIVE 10/24/2016 1727   KETONESUR 20 (A) 10/24/2016 1727   PROTEINUR NEGATIVE 10/24/2016 1727   NITRITE NEGATIVE 10/24/2016 1727   LEUKOCYTESUR SMALL (A) 10/24/2016 1727    Pertinent Imaging: See above  Assessment & Plan:  The patient may have had a urinary tract infection. I recommended a full hematuria workup especially with him being a smoker. The CT scan with contrast will be ordered and he would come back for cystoscopy. Based upon the rectal examination I will also order a PSA with his BUN/creatinine   I do not believe his back pain is from his genitourinary system but further comment on this can be made post repeat CT scan. He agreed with the recommendations but admits  he is very nervous about the cystoscopy  1. Dysuria - Urinalysis, Complete - CULTURE, URINE COMPREHENSIVE  2. Gross hematuria  - Urinalysis, Complete - CULTURE, URINE COMPREHENSIVE   No Follow-up on file.  Reece Packer, MD  [facility] [address], [place], [place] [address] [phone]

## 2016-10-29 NOTE — Progress Notes (Deleted)
uri

## 2016-10-31 LAB — PSA: Prostate Specific Ag, Serum: 0.5 ng/mL (ref 0.0–4.0)

## 2016-11-01 LAB — CULTURE, URINE COMPREHENSIVE

## 2016-11-07 ENCOUNTER — Other Ambulatory Visit: Payer: Self-pay

## 2016-11-07 DIAGNOSIS — R310 Gross hematuria: Secondary | ICD-10-CM

## 2016-11-12 ENCOUNTER — Ambulatory Visit: Payer: 59

## 2016-11-20 ENCOUNTER — Telehealth: Payer: Self-pay | Admitting: Urology

## 2016-11-20 NOTE — Telephone Encounter (Signed)
FYI - Patient cancelled his CT scan and cysto appt. He stated that he will call back at a later date to get these appointments rescheduled.

## 2016-11-24 ENCOUNTER — Other Ambulatory Visit: Payer: 59

## 2016-11-27 ENCOUNTER — Telehealth: Payer: Self-pay

## 2016-11-27 DIAGNOSIS — Z206 Contact with and (suspected) exposure to human immunodeficiency virus [HIV]: Secondary | ICD-10-CM

## 2016-11-27 NOTE — Telephone Encounter (Signed)
Patient called and wanted to know what kind of tests he had in the past (personal, would not give any further information) and wants to know if he needs another. Asked for Malachy Mood or Cheryl's CMA to give him a call back.    [phone]

## 2016-11-27 NOTE — Telephone Encounter (Signed)
If he would like to be tested, he can come in today for a blood test. Order in.

## 2016-11-27 NOTE — Telephone Encounter (Signed)
Called and spoke to patient. Patient stated that he has been seperrated from his wife for 4 years now and she recently told him that she was HIV positive. Patient asked when the last time he was tested for HIV and I told the patient that we tested him on 04/07/16 and he had a negative result. Patient states that he has not been intimate with his wife in 4 years but wants to know if he should be tested again. I told the patient that Malachy Mood was not in the office today but that she would be back tomorrow and that I would call him back then to let him know what Malachy Mood says.

## 2016-11-27 NOTE — Telephone Encounter (Signed)
Called and let patient know that he could have the test done if he would like and that Dr. Wynetta Emery put the order in. Patient stated he would probably stop by this afternoon or tomorrow to have lab done.

## 2016-11-28 ENCOUNTER — Other Ambulatory Visit: Payer: 59

## 2016-11-28 DIAGNOSIS — Z206 Contact with and (suspected) exposure to human immunodeficiency virus [HIV]: Secondary | ICD-10-CM

## 2016-11-29 LAB — HIV ANTIBODY (ROUTINE TESTING W REFLEX): HIV SCREEN 4TH GENERATION: NONREACTIVE

## 2016-12-01 ENCOUNTER — Telehealth: Payer: Self-pay | Admitting: Family Medicine

## 2016-12-01 NOTE — Telephone Encounter (Signed)
Please let him know that his HIV came back negative.

## 2016-12-01 NOTE — Telephone Encounter (Signed)
Patient notified of results.

## 2016-12-06 ENCOUNTER — Other Ambulatory Visit: Payer: Self-pay | Admitting: Unknown Physician Specialty

## 2016-12-17 ENCOUNTER — Encounter: Payer: Self-pay | Admitting: Unknown Physician Specialty

## 2016-12-17 ENCOUNTER — Ambulatory Visit (INDEPENDENT_AMBULATORY_CARE_PROVIDER_SITE_OTHER): Payer: 59 | Admitting: Unknown Physician Specialty

## 2016-12-17 VITALS — BP 132/82 | HR 65 | Temp 98.6°F | Wt 260.4 lb

## 2016-12-17 DIAGNOSIS — K529 Noninfective gastroenteritis and colitis, unspecified: Secondary | ICD-10-CM

## 2016-12-17 DIAGNOSIS — M7661 Achilles tendinitis, right leg: Secondary | ICD-10-CM

## 2016-12-17 MED ORDER — ONDANSETRON HCL 4 MG PO TABS: 4.0000 mg | ORAL_TABLET | Freq: Three times a day (TID) | ORAL | 0 refills | Status: DC | PRN

## 2016-12-17 NOTE — Progress Notes (Addendum)
BP 132/82 (BP Location: Left Arm, Patient Position: Sitting, Cuff Size: Large)   Pulse 65   Temp 98.6 F (37 C)   Wt 260 lb 6.4 oz (118.1 kg)   SpO2 97%   BMI 44.70 kg/m    Subjective:    Patient ID: Lance Kim, male    DOB: August 21, 1971, 46 y.o.   MRN: [id number]  HPI: Lamonta Cypress is a 46 y.o. male  Chief Complaint  Patient presents with  . No Appetite    pt states he has had no appetite for the last 4 days, states he last had a full meal Saturday evening and got sick after eating meal. Pt states that the smell of food just makes his quezy.   [name] Ankle Pain    pt states his right ankle has been swelling and hurting for the last 2 to 3 weeks. States he has been walking more and does not know if it may be coming from that   States he went to the "wing place" at the mall and 2 hours later throwing up.  He is not vomiting today and took some Zofran he had left over.  No diarrhea.  He is able to keep down water and ginger ale but no solid foods.  Still nauseated and no diarrhea.  No headache.  No fever at this time.    Ankle pain Starting to walk more but back of right ankle starting to swell and painful to walk on.  Pain is ongoing for 2-3 weeks but swelling is down and it is less painful.    Relevant past medical, surgical, family and social history reviewed and updated as indicated. Interim medical history since our last visit reviewed. Allergies and medications reviewed and updated.  Review of Systems  Constitutional: Positive for appetite change.  HENT: Negative.   Respiratory: Negative for shortness of breath.   Cardiovascular: Negative for chest pain and palpitations.  Gastrointestinal: Negative for blood in stool and diarrhea.  Genitourinary: Negative.     Per HPI unless specifically indicated above     Objective:    BP 132/82 (BP Location: Left Arm, Patient Position: Sitting, Cuff Size: Large)   Pulse 65   Temp 98.6 F (37 C)   Wt 260 lb 6.4 oz (118.1 kg)    SpO2 97%   BMI 44.70 kg/m   Wt Readings from Last 3 Encounters:  12/17/16 260 lb 6.4 oz (118.1 kg)  10/29/16 267 lb (121.1 kg)  10/24/16 260 lb (117.9 kg)    Physical Exam  Constitutional: He is oriented to person, place, and time. He appears well-developed and well-nourished. No distress.  HENT:  Head: Normocephalic and atraumatic.  Eyes: Conjunctivae and lids are normal. Right eye exhibits no discharge. Left eye exhibits no discharge. No scleral icterus.  Neck: Normal range of motion. Neck supple. No JVD present. Carotid bruit is not present.  Cardiovascular: Normal rate, regular rhythm and normal heart sounds.   Pulmonary/Chest: Effort normal and breath sounds normal. No respiratory distress.  Abdominal: Soft. Normal appearance and bowel sounds are normal. He exhibits no distension. There is no splenomegaly or hepatomegaly. There is no tenderness. There is no rebound and no guarding.  Musculoskeletal: Normal range of motion.  Right achilles insertion tender  Neurological: He is alert and oriented to person, place, and time.  Skin: Skin is warm, dry and intact. No rash noted. No pallor.  Psychiatric: He has a normal mood and affect. His behavior is normal. Judgment and  thought content normal.  Noted poor footwear.    Results for orders placed or performed in visit on 11/28/16  HIV antibody  Result Value Ref Range   HIV Screen 4th Generation wRfx Non Reactive Non Reactive      Assessment & Plan:   Problem List Items Addressed This Visit    None    Visit Diagnoses    Gastroenteritis    -  Primary   vs food poisoning.  New problem seems to be improving and drinking fluids.  -Refill Zofran 4 mg prn.  disussed out of work till he can eat solid foods.     Achilles tendinitis of right lower extremity       New problem.  Discussed getting better shoes for walking at a sporting goods store.  Possible arch supports.  Tylenol and rest for pain      Discussed slow return to walking  once he gets better footwear.    Follow up plan: Return if symptoms worsen or fail to improve.

## 2017-03-21 ENCOUNTER — Other Ambulatory Visit: Payer: Self-pay | Admitting: Unknown Physician Specialty

## 2017-04-10 ENCOUNTER — Ambulatory Visit (INDEPENDENT_AMBULATORY_CARE_PROVIDER_SITE_OTHER): Payer: 59 | Admitting: Unknown Physician Specialty

## 2017-04-10 ENCOUNTER — Encounter: Payer: Self-pay | Admitting: Unknown Physician Specialty

## 2017-04-10 VITALS — BP 125/81 | HR 82 | Temp 98.5°F | Ht 62.5 in | Wt 268.9 lb

## 2017-04-10 DIAGNOSIS — Z0000 Encounter for general adult medical examination without abnormal findings: Secondary | ICD-10-CM

## 2017-04-10 DIAGNOSIS — I10 Essential (primary) hypertension: Secondary | ICD-10-CM

## 2017-04-10 DIAGNOSIS — Z6841 Body Mass Index (BMI) 40.0 and over, adult: Secondary | ICD-10-CM | POA: Diagnosis not present

## 2017-04-10 DIAGNOSIS — F325 Major depressive disorder, single episode, in full remission: Secondary | ICD-10-CM | POA: Diagnosis not present

## 2017-04-10 DIAGNOSIS — L409 Psoriasis, unspecified: Secondary | ICD-10-CM | POA: Diagnosis not present

## 2017-04-10 DIAGNOSIS — G8929 Other chronic pain: Secondary | ICD-10-CM

## 2017-04-10 DIAGNOSIS — G4733 Obstructive sleep apnea (adult) (pediatric): Secondary | ICD-10-CM | POA: Diagnosis not present

## 2017-04-10 DIAGNOSIS — F17200 Nicotine dependence, unspecified, uncomplicated: Secondary | ICD-10-CM

## 2017-04-10 DIAGNOSIS — M5441 Lumbago with sciatica, right side: Secondary | ICD-10-CM | POA: Diagnosis not present

## 2017-04-10 MED ORDER — FLUOXETINE HCL 20 MG PO TABS: 20.0000 mg | ORAL_TABLET | Freq: Every day | ORAL | 1 refills | Status: DC

## 2017-04-10 MED ORDER — ATORVASTATIN CALCIUM 20 MG PO TABS: 20.0000 mg | ORAL_TABLET | Freq: Every day | ORAL | 3 refills | Status: DC

## 2017-04-10 MED ORDER — TRIAMCINOLONE ACETONIDE 0.5 % EX CREA: 1.0000 "application " | TOPICAL_CREAM | Freq: Two times a day (BID) | CUTANEOUS | 3 refills | Status: DC

## 2017-04-10 MED ORDER — MELOXICAM 7.5 MG PO TABS: 15.0000 mg | ORAL_TABLET | Freq: Every day | ORAL | 0 refills | Status: DC

## 2017-04-10 MED ORDER — MELOXICAM 7.5 MG PO TABS: 7.5000 mg | ORAL_TABLET | Freq: Every day | ORAL | 0 refills | Status: DC

## 2017-04-10 NOTE — Progress Notes (Signed)
BP 125/81   Pulse 82   Temp 98.5 F (36.9 C)   Ht 5' 2.5" (1.588 m)   Wt 268 lb 14.4 oz (122 kg)   SpO2 95%   BMI 48.40 kg/m    Subjective:    Patient ID: Lance Kim, male    DOB: [birth year], 46 y.o.   MRN: [id number]  HPI: Lance Kim is a 46 y.o. male  Chief Complaint  Patient presents with  . Annual Exam   Hypertension Using medications without difficulty Average home BPs   No problems or lightheadedness No chest pain with exertion or shortness of breath No Edema   Hyperlipidemia Using medications without problems: No Muscle aches  Diet compliance:Exercise: Not doing well.  Diet is better than exercise  Depression Doing well on present medications.   Depression screen [facility] 2/9 04/10/2017 10/08/2016 04/07/2016 08/27/2015  Decreased Interest 0 2 0 0  Down, Depressed, Hopeless 0 0 0 0  PHQ - 2 Score 0 2 0 0  Altered sleeping 0 0 - -  Tired, decreased energy 1 1 - -  Change in appetite 1 1 - -  Feeling bad or failure about yourself  0 0 - -  Trouble concentrating 0 0 - -  Moving slowly or fidgety/restless 0 0 - -  Suicidal thoughts 0 0 - -  PHQ-9 Score 2 4 - -    Psoriasis Needs cream refilled  Back pain Takes Meloxicam on occasion.  Stretches his back when it starts to bother him.  Sleep apnea Diagnosed in the past.  Never got a CPAP.  Snores.  Does not wake feeling rested.  Occasionally wakes with a headache.  Does not fall asleep during the day  Social History   Social History  . Marital status: Married    Spouse name: N/A  . Number of children: N/A  . Years of education: N/A   Occupational History  . Not on file.   Social History Main Topics  . Smoking status: Current Every Day Smoker    Packs/day: 0.50    Types: Cigarettes  . Smokeless tobacco: Never Used  . Alcohol use 0.6 - 1.2 oz/week    1 - 2 Standard drinks or equivalent per week     Comment: on occasion  . Drug use: No  . Sexual activity: Yes    Partners: Female   Comment: satidfied with sexual function   Other Topics Concern  . Not on file   Social History Narrative  . No narrative on file   Family History  Problem Relation Age of Onset  . Diabetes Mother   . Emphysema Father   . Cancer Son   . Cancer Maternal Grandmother   . Prostate cancer Neg Hx   . Bladder Cancer Neg Hx   . Kidney cancer Neg Hx    Past Medical History:  Diagnosis Date  . Anxiety   . Depression   . Hyperlipidemia   . Hypertension   . Hypertensive renal disease without failure   . Tobacco use disorder    Past Surgical History:  Procedure Laterality Date  . WISDOM TOOTH EXTRACTION      Relevant past medical, surgical, family and social history reviewed and updated as indicated. Interim medical history since our last visit reviewed. Allergies and medications reviewed and updated.  Review of Systems  Constitutional: Negative.   HENT: Negative.   Eyes: Negative.   Respiratory: Negative.   Cardiovascular: Negative.   Gastrointestinal: Negative.   Endocrine:  Negative.   Genitourinary: Negative.   Musculoskeletal:       Some right knee pain.    Skin:       Extensive psoriasis  Allergic/Immunologic: Negative.   Neurological: Negative.   Hematological: Negative.   Psychiatric/Behavioral: Negative.     Per HPI unless specifically indicated above     Objective:    BP 125/81   Pulse 82   Temp 98.5 F (36.9 C)   Ht 5' 2.5" (1.588 m)   Wt 268 lb 14.4 oz (122 kg)   SpO2 95%   BMI 48.40 kg/m   Wt Readings from Last 3 Encounters:  04/10/17 268 lb 14.4 oz (122 kg)  12/17/16 260 lb 6.4 oz (118.1 kg)  10/29/16 267 lb (121.1 kg)    Physical Exam  Constitutional: He is oriented to person, place, and time. He appears well-developed and well-nourished.  HENT:  Head: Normocephalic.  Right Ear: Tympanic membrane, external ear and ear canal normal.  Left Ear: Tympanic membrane, external ear and ear canal normal.  Mouth/Throat: Uvula is midline, oropharynx  is clear and moist and mucous membranes are normal.  Eyes: Pupils are equal, round, and reactive to light.  Cardiovascular: Normal rate, regular rhythm and normal heart sounds.  Exam reveals no gallop and no friction rub.   No murmur heard. Pulmonary/Chest: Effort normal and breath sounds normal. No respiratory distress.  Abdominal: Soft. Bowel sounds are normal. He exhibits no distension. There is no tenderness.  Musculoskeletal: Normal range of motion.  Neurological: He is alert and oriented to person, place, and time. He has normal reflexes.  Skin: Skin is warm and dry.  Extensive psoriasis  Psychiatric: He has a normal mood and affect. His behavior is normal. Judgment and thought content normal.    Results for orders placed or performed in visit on 11/28/16  HIV antibody  Result Value Ref Range   HIV Screen 4th Generation wRfx Non Reactive Non Reactive      Assessment & Plan:   Problem List Items Addressed This Visit      Unprioritized   Benign essential HTN    Stable, continue present medications.        Relevant Medications   atorvastatin (LIPITOR) 20 MG tablet   Depression    Stable, continue present medications.        Relevant Medications   FLUoxetine (PROZAC) 20 MG tablet   Low back pain    Continue present self care      Relevant Medications   meloxicam (MOBIC) 7.5 MG tablet   Obstructive sleep apnea syndrome    Schedule a sleep study      Relevant Orders   Ambulatory referral to Sleep Studies   Psoriasis    Stable, continue present medications.        Severe obesity (BMI >= 40) (HCC)    Discussed diet and exercise      Tobacco use disorder    Encouraged to quit       Other Visit Diagnoses    Annual physical exam    -  Primary       Follow up plan: No Follow-up on file.

## 2017-04-10 NOTE — Assessment & Plan Note (Signed)
Stable, continue present medications.   

## 2017-04-10 NOTE — Assessment & Plan Note (Signed)
Discussed diet and exercise 

## 2017-04-10 NOTE — Assessment & Plan Note (Signed)
Continue present self care

## 2017-04-10 NOTE — Addendum Note (Signed)
Addended by: Kathrine Haddock on: 04/10/2017 04:41 PM   Modules accepted: Orders

## 2017-04-10 NOTE — Assessment & Plan Note (Signed)
Encouraged to quit. 

## 2017-04-10 NOTE — Assessment & Plan Note (Signed)
Schedule a sleep study

## 2017-04-11 LAB — CBC WITH DIFFERENTIAL/PLATELET
BASOS ABS: 0 10*3/uL (ref 0.0–0.2)
BASOS: 0 %
EOS (ABSOLUTE): 0.2 10*3/uL (ref 0.0–0.4)
Eos: 3 %
Hematocrit: 42.4 % (ref 37.5–51.0)
Hemoglobin: 14.4 g/dL (ref 13.0–17.7)
IMMATURE GRANS (ABS): 0 10*3/uL (ref 0.0–0.1)
Immature Granulocytes: 0 %
LYMPHS ABS: 3 10*3/uL (ref 0.7–3.1)
LYMPHS: 39 %
MCH: 28.5 pg (ref 26.6–33.0)
MCHC: 34 g/dL (ref 31.5–35.7)
MCV: 84 fL (ref 79–97)
MONOCYTES: 7 %
Monocytes Absolute: 0.5 10*3/uL (ref 0.1–0.9)
NEUTROS ABS: 3.9 10*3/uL (ref 1.4–7.0)
Neutrophils: 51 %
Platelets: 256 10*3/uL (ref 150–379)
RBC: 5.05 x10E6/uL (ref 4.14–5.80)
RDW: 15.1 % (ref 12.3–15.4)
WBC: 7.7 10*3/uL (ref 3.4–10.8)

## 2017-04-11 LAB — LIPID PANEL W/O CHOL/HDL RATIO
Cholesterol, Total: 118 mg/dL (ref 100–199)
HDL: 31 mg/dL — ABNORMAL LOW (ref >39)
LDL Calculated: 48 mg/dL (ref 0–99)
Triglycerides: 197 mg/dL — ABNORMAL HIGH (ref 0–149)
VLDL Cholesterol Cal: 39 mg/dL (ref 5–40)

## 2017-04-11 LAB — COMPREHENSIVE METABOLIC PANEL
A/G RATIO: 1.3 (ref 1.2–2.2)
ALBUMIN: 4.2 g/dL (ref 3.5–5.5)
ALT: 19 IU/L (ref 0–44)
AST: 22 IU/L (ref 0–40)
Alkaline Phosphatase: 83 IU/L (ref 39–117)
BUN / CREAT RATIO: 9 (ref 9–20)
BUN: 10 mg/dL (ref 6–24)
Bilirubin Total: 0.3 mg/dL (ref 0.0–1.2)
CO2: 25 mmol/L (ref 20–29)
CREATININE: 1.06 mg/dL (ref 0.76–1.27)
Calcium: 9.5 mg/dL (ref 8.7–10.2)
Chloride: 100 mmol/L (ref 96–106)
GFR calc Af Amer: 96 mL/min/{1.73_m2} (ref >59)
GFR, EST NON AFRICAN AMERICAN: 83 mL/min/{1.73_m2} (ref >59)
GLOBULIN, TOTAL: 3.2 g/dL (ref 1.5–4.5)
Glucose: 98 mg/dL (ref 65–99)
POTASSIUM: 4.2 mmol/L (ref 3.5–5.2)
SODIUM: 138 mmol/L (ref 134–144)
Total Protein: 7.4 g/dL (ref 6.0–8.5)

## 2017-04-11 LAB — TSH: TSH: 1.12 u[IU]/mL (ref 0.450–4.500)

## 2017-04-13 ENCOUNTER — Encounter: Payer: Self-pay | Admitting: Unknown Physician Specialty

## 2017-05-04 DIAGNOSIS — Z79899 Other long term (current) drug therapy: Secondary | ICD-10-CM | POA: Diagnosis not present

## 2017-05-04 DIAGNOSIS — L400 Psoriasis vulgaris: Secondary | ICD-10-CM | POA: Diagnosis not present

## 2017-06-19 ENCOUNTER — Other Ambulatory Visit: Payer: Self-pay | Admitting: Unknown Physician Specialty

## 2017-07-01 DIAGNOSIS — Z23 Encounter for immunization: Secondary | ICD-10-CM | POA: Diagnosis not present

## 2017-08-14 ENCOUNTER — Ambulatory Visit (INDEPENDENT_AMBULATORY_CARE_PROVIDER_SITE_OTHER): Payer: 59 | Admitting: Unknown Physician Specialty

## 2017-08-14 ENCOUNTER — Encounter: Payer: Self-pay | Admitting: Unknown Physician Specialty

## 2017-08-14 VITALS — BP 109/74 | HR 71 | Temp 98.5°F | Wt 264.8 lb

## 2017-08-14 DIAGNOSIS — H6121 Impacted cerumen, right ear: Secondary | ICD-10-CM

## 2017-08-14 DIAGNOSIS — M25561 Pain in right knee: Secondary | ICD-10-CM | POA: Diagnosis not present

## 2017-08-14 DIAGNOSIS — M25461 Effusion, right knee: Secondary | ICD-10-CM | POA: Diagnosis not present

## 2017-08-14 DIAGNOSIS — K5903 Drug induced constipation: Secondary | ICD-10-CM

## 2017-08-14 DIAGNOSIS — J069 Acute upper respiratory infection, unspecified: Secondary | ICD-10-CM

## 2017-08-14 NOTE — Progress Notes (Signed)
BP 109/74   Pulse 71   Temp 98.5 F (36.9 C) (Oral)   Wt 264 lb 12.8 oz (120.1 kg)   SpO2 98%   BMI 47.66 kg/m    Subjective:    Patient ID: Lance Kim, male    DOB: Apr 19, 1971, 46 y.o.   MRN: [id number]  HPI: Lance Kim is a 46 y.o. male  Chief Complaint  Patient presents with  . Ear Pain    pt states he has been having right ear pain and a headache for a week and a half   . Knee Pain    pt states he has had right knee pain for a while but it has recently started swelling in the last week   . Constipation    pt states he has been constipated for about 2 weeks    Pt states he is having trouble hearing out of right ear and occasionally a sharp pain.  This has been going on for about a week.  Having nasal congestion worse at night and taking cold and flu medication.    Right knee swelling Pt states he is having right knee swelling and believes it's due to psoriatic arthritis.  He stopped his psoriasis medication and knee started hurting after that.  Now it is swelling but still having pain and havin constant pain while driving and sometime gives out when he walks.  meloxicam helps  Relevant past medical, surgical, family and social history reviewed and updated as indicated. Interim medical history since our last visit reviewed. Allergies and medications reviewed and updated.  Review of Systems  Constitutional: Negative.   HENT: Positive for congestion.   Eyes: Negative.   Respiratory: Negative.   Cardiovascular: Negative.   Gastrointestinal: Positive for constipation.       Taking Zofran thinks it makes him constipation  Genitourinary: Negative.   Psychiatric/Behavioral: Negative.     Per HPI unless specifically indicated above     Objective:    BP 109/74   Pulse 71   Temp 98.5 F (36.9 C) (Oral)   Wt 264 lb 12.8 oz (120.1 kg)   SpO2 98%   BMI 47.66 kg/m   Wt Readings from Last 3 Encounters:  08/14/17 264 lb 12.8 oz (120.1 kg)  04/10/17 268 lb 14.4  oz (122 kg)  12/17/16 260 lb 6.4 oz (118.1 kg)    Physical Exam  Constitutional: He is oriented to person, place, and time. He appears well-developed and well-nourished. No distress.  HENT:  Head: Normocephalic and atraumatic.  Rt ear with cerumen impaction.  Cleared with irrigation.    Eyes: Conjunctivae and lids are normal. Right eye exhibits no discharge. Left eye exhibits no discharge. No scleral icterus.  Neck: Normal range of motion. Neck supple. No JVD present. Carotid bruit is not present.  Cardiovascular: Normal rate, regular rhythm and normal heart sounds.  Pulmonary/Chest: Effort normal and breath sounds normal. No respiratory distress.  Abdominal: Normal appearance. There is no splenomegaly or hepatomegaly.  Musculoskeletal: Normal range of motion.       Right knee: He exhibits bony tenderness. He exhibits normal range of motion, no swelling and no effusion. Tenderness found.  Neurological: He is alert and oriented to person, place, and time.  Skin: Skin is warm, dry and intact. No rash noted. No pallor.  Psychiatric: He has a normal mood and affect. His behavior is normal. Judgment and thought content normal.    Results for orders placed or performed in visit on 04/10/17  CBC with Differential/Platelet  Result Value Ref Range   WBC 7.7 3.4 - 10.8 x10E3/uL   RBC 5.05 4.14 - 5.80 x10E6/uL   Hemoglobin 14.4 13.0 - 17.7 g/dL   Hematocrit 42.4 37.5 - 51.0 %   MCV 84 79 - 97 fL   MCH 28.5 26.6 - 33.0 pg   MCHC 34.0 31.5 - 35.7 g/dL   RDW 15.1 12.3 - 15.4 %   Platelets 256 150 - 379 x10E3/uL   Neutrophils 51 Not Estab. %   Lymphs 39 Not Estab. %   Monocytes 7 Not Estab. %   Eos 3 Not Estab. %   Basos 0 Not Estab. %   Neutrophils Absolute 3.9 1.4 - 7.0 x10E3/uL   Lymphocytes Absolute 3.0 0.7 - 3.1 x10E3/uL   Monocytes Absolute 0.5 0.1 - 0.9 x10E3/uL   EOS (ABSOLUTE) 0.2 0.0 - 0.4 x10E3/uL   Basophils Absolute 0.0 0.0 - 0.2 x10E3/uL   Immature Granulocytes 0 Not Estab. %    Immature Grans (Abs) 0.0 0.0 - 0.1 x10E3/uL  Comprehensive metabolic panel  Result Value Ref Range   Glucose 98 65 - 99 mg/dL   BUN 10 6 - 24 mg/dL   Creatinine, Ser 1.06 0.76 - 1.27 mg/dL   GFR calc non Af Amer 83 >59 mL/min/1.73   GFR calc Af Amer 96 >59 mL/min/1.73   BUN/Creatinine Ratio 9 9 - 20   Sodium 138 134 - 144 mmol/L   Potassium 4.2 3.5 - 5.2 mmol/L   Chloride 100 96 - 106 mmol/L   CO2 25 20 - 29 mmol/L   Calcium 9.5 8.7 - 10.2 mg/dL   Total Protein 7.4 6.0 - 8.5 g/dL   Albumin 4.2 3.5 - 5.5 g/dL   Globulin, Total 3.2 1.5 - 4.5 g/dL   Albumin/Globulin Ratio 1.3 1.2 - 2.2   Bilirubin Total 0.3 0.0 - 1.2 mg/dL   Alkaline Phosphatase 83 39 - 117 IU/L   AST 22 0 - 40 IU/L   ALT 19 0 - 44 IU/L  Lipid Panel w/o Chol/HDL Ratio  Result Value Ref Range   Cholesterol, Total 118 100 - 199 mg/dL   Triglycerides 197 (H) 0 - 149 mg/dL   HDL 31 (L) >39 mg/dL   VLDL Cholesterol Cal 39 5 - 40 mg/dL   LDL Calculated 48 0 - 99 mg/dL  TSH  Result Value Ref Range   TSH 1.120 0.450 - 4.500 uIU/mL      Assessment & Plan:   Problem List Items Addressed This Visit    None    Visit Diagnoses    Excessive ear wax, right    -  Primary   cleared with irrigation   Pain and swelling of right knee       Tender on top of knee, not really joint line.  Will get a x-ray   Relevant Orders   DG Knee Complete 4 Views Right   Upper respiratory tract infection, unspecified type       Continue OTC meds.  Fluids and rest.  Saline nasal sprays   Drug-induced constipation       Use Miralax OTC or milk of magnisia       Follow up plan: Return if symptoms worsen or fail to improve.

## 2017-08-14 NOTE — Patient Instructions (Addendum)
Upper Respiratory Infection, Adult Most upper respiratory infections (URIs) are a viral infection of the air passages leading to the lungs. A URI affects the nose, throat, and upper air passages. The most common type of URI is nasopharyngitis and is typically referred to as "the common cold." URIs run their course and usually go away on their own. Most of the time, a URI does not require medical attention, but sometimes a bacterial infection in the upper airways can follow a viral infection. This is called a secondary infection. Sinus and middle ear infections are common types of secondary upper respiratory infections. Bacterial pneumonia can also complicate a URI. A URI can worsen asthma and chronic obstructive pulmonary disease (COPD). Sometimes, these complications can require emergency medical care and may be life threatening. What are the causes? Almost all URIs are caused by viruses. A virus is a type of germ and can spread from one person to another. What increases the risk? You may be at risk for a URI if:  You smoke.  You have chronic heart or lung disease.  You have a weakened defense (immune) system.  You are very young or very old.  You have nasal allergies or asthma.  You work in crowded or poorly ventilated areas.  You work in health care facilities or schools.  What are the signs or symptoms? Symptoms typically develop 2-3 days after you come in contact with a cold virus. Most viral URIs last 7-10 days. However, viral URIs from the influenza virus (flu virus) can last 14-18 days and are typically more severe. Symptoms may include:  Runny or stuffy (congested) nose.  Sneezing.  Cough.  Sore throat.  Headache.  Fatigue.  Fever.  Loss of appetite.  Pain in your forehead, behind your eyes, and over your cheekbones (sinus pain).  Muscle aches.  How is this diagnosed? Your health care provider may diagnose a URI by:  Physical exam.  Tests to check that your  symptoms are not due to another condition such as: ? Strep throat. ? Sinusitis. ? Pneumonia. ? Asthma.  How is this treated? A URI goes away on its own with time. It cannot be cured with medicines, but medicines may be prescribed or recommended to relieve symptoms. Medicines may help:  Reduce your fever.  Reduce your cough.  Relieve nasal congestion.  Follow these instructions at home:  Take medicines only as directed by your health care provider.  Gargle warm saltwater or take cough drops to comfort your throat as directed by your health care provider.  Use a warm mist humidifier or inhale steam from a shower to increase air moisture. This may make it easier to breathe.  Drink enough fluid to keep your urine clear or pale yellow.  Eat soups and other clear broths and maintain good nutrition.  Rest as needed.  Return to work when your temperature has returned to normal or as your health care provider advises. You may need to stay home longer to avoid infecting others. You can also use a face mask and careful hand washing to prevent spread of the virus.  Increase the usage of your inhaler if you have asthma.  Do not use any tobacco products, including cigarettes, chewing tobacco, or electronic cigarettes. If you need help quitting, ask your health care provider. How is this prevented? The best way to protect yourself from getting a cold is to practice good hygiene.  Avoid oral or hand contact with people with cold symptoms.  Wash your   hands often if contact occurs.  There is no clear evidence that vitamin C, vitamin E, echinacea, or exercise reduces the chance of developing a cold. However, it is always recommended to get plenty of rest, exercise, and practice good nutrition. Contact a health care provider if:  You are getting worse rather than better.  Your symptoms are not controlled by medicine.  You have chills.  You have worsening shortness of breath.  You have  brown or red mucus.  You have yellow or brown nasal discharge.  You have pain in your face, especially when you bend forward.  You have a fever.  You have swollen neck glands.  You have pain while swallowing.  You have white areas in the back of your throat. Get help right away if:  You have severe or persistent: ? Headache. ? Ear pain. ? Sinus pain. ? Chest pain.  You have chronic lung disease and any of the following: ? Wheezing. ? Prolonged cough. ? Coughing up blood. ? A change in your usual mucus.  You have a stiff neck.  You have changes in your: ? Vision. ? Hearing. ? Thinking. ? Mood. This information is not intended to replace advice given to you by your health care provider. Make sure you discuss any questions you have with your health care provider. Document Released: 02/25/2001 Document Revised: 05/04/2016 Document Reviewed: 12/07/2013 Elsevier Interactive Patient Education  2017 [place], Adult The ears produce a substance called earwax that helps keep bacteria out of the ear and protects the skin in the ear canal. Occasionally, earwax can build up in the ear and cause discomfort or hearing loss. What increases the risk? This condition is more likely to develop in people who:  Are male.  Are elderly.  Naturally produce more earwax.  Clean their ears often with cotton swabs.  Use earplugs often.  Use in-ear headphones often.  Wear hearing aids.  Have narrow ear canals.  Have earwax that is overly thick or sticky.  Have eczema.  Are dehydrated.  Have excess hair in the ear canal.  What are the signs or symptoms? Symptoms of this condition include:  Reduced or muffled hearing.  A feeling of fullness in the ear or feeling that the ear is plugged.  Fluid coming from the ear.  Ear pain.  Ear itch.  Ringing in the ear.  Coughing.  An obvious piece of earwax that can be seen inside the ear canal.  How is  this diagnosed? This condition may be diagnosed based on:  Your symptoms.  Your medical history.  An ear exam. During the exam, your health care provider will look into your ear with an instrument called an otoscope.  You may have tests, including a hearing test. How is this treated? This condition may be treated by:  Using ear drops to soften the earwax.  Having the earwax removed by a health care provider. The health care provider may: ? Flush the ear with water. ? Use an instrument that has a loop on the end (curette). ? Use a suction device.  Surgery to remove the wax buildup. This may be done in severe cases.  Follow these instructions at home:  Take over-the-counter and prescription medicines only as told by your health care provider.  Do not put any objects, including cotton swabs, into your ear. You can clean the opening of your ear canal with a washcloth or facial tissue.  Follow instructions from your health care  provider about cleaning your ears. Do not over-clean your ears.  Drink enough fluid to keep your urine clear or pale yellow. This will help to thin the earwax.  Keep all follow-up visits as told by your health care provider. If earwax builds up in your ears often or if you use hearing aids, consider seeing your health care provider for routine, preventive ear cleanings. Ask your health care provider how often you should schedule your cleanings.  If you have hearing aids, clean them according to instructions from the manufacturer and your health care provider. Contact a health care provider if:  You have ear pain.  You develop a fever.  You have blood, pus, or other fluid coming from your ear.  You have hearing loss.  You have ringing in your ears that does not go away.  Your symptoms do not improve with treatment.  You feel like the room is spinning (vertigo). Summary  Earwax can build up in the ear and cause discomfort or hearing loss.  The  most common symptoms of this condition include reduced or muffled hearing and a feeling of fullness in the ear or feeling that the ear is plugged.  This condition may be diagnosed based on your symptoms, your medical history, and an ear exam.  This condition may be treated by using ear drops to soften the earwax or by having the earwax removed by a health care provider.  Do not put any objects, including cotton swabs, into your ear. You can clean the opening of your ear canal with a washcloth or facial tissue. This information is not intended to replace advice given to you by your health care provider. Make sure you discuss any questions you have with your health care provider. Document Released: 10/09/2004 Document Revised: 11/12/2016 Document Reviewed: 11/12/2016 Elsevier Interactive Patient Education  [employer].

## 2017-08-18 ENCOUNTER — Ambulatory Visit
Admission: RE | Admit: 2017-08-18 | Discharge: 2017-08-18 | Disposition: A | Discharge status: Home / Self Care | Payer: 59 | Source: Ambulatory Visit | Attending: Unknown Physician Specialty | Admitting: Unknown Physician Specialty

## 2017-08-18 DIAGNOSIS — M25461 Effusion, right knee: Secondary | ICD-10-CM

## 2017-08-18 DIAGNOSIS — M25561 Pain in right knee: Secondary | ICD-10-CM | POA: Diagnosis present

## 2017-08-19 ENCOUNTER — Other Ambulatory Visit: Payer: Self-pay | Admitting: Unknown Physician Specialty

## 2017-08-19 DIAGNOSIS — M25562 Pain in left knee: Secondary | ICD-10-CM

## 2017-08-26 ENCOUNTER — Telehealth: Payer: Self-pay | Admitting: Unknown Physician Specialty

## 2017-08-26 DIAGNOSIS — M25561 Pain in right knee: Secondary | ICD-10-CM | POA: Diagnosis not present

## 2017-08-26 DIAGNOSIS — M25562 Pain in left knee: Secondary | ICD-10-CM

## 2017-08-26 NOTE — Telephone Encounter (Signed)
Copied from [facility] #[id number]. Topic: Referral - Request >> Aug 26, 2017  1:26 PM Scherrie Gerlach wrote: Reason for CRM: pt states the orthopedic dr referred him to advises him to see a rheumatologist. That [facility] Mater states they will forward the paperwork with recommendations as well

## 2017-08-26 NOTE — Telephone Encounter (Signed)
Called and spoke to patient. He states that ortho recommends that he sees a rheumatologist. Have not received any paperwork with the recommendations or anything yet.

## 2017-08-26 NOTE — Telephone Encounter (Signed)
OK, will do. 

## 2017-08-26 NOTE — Telephone Encounter (Signed)
Please Advise

## 2017-08-26 NOTE — Telephone Encounter (Signed)
Patient notified

## 2017-09-04 ENCOUNTER — Ambulatory Visit: Payer: Self-pay | Admitting: *Deleted

## 2017-09-04 NOTE — Telephone Encounter (Signed)
  Called in c/o burning with urination .   He thinks he may have been exposed to  STD because the condom broke during sex.    I got him an appt with Kathrine Haddock, FNP for 09/07/17 at 9:00. I instructed him to call back if he becomes worse or go to the walk in clinic.   He verbalized understanding. Reason for Disposition . All other males with painful urination  Answer Assessment - Initial Assessment Questions 1. SYMPTOM: "What's the main symptom you're concerned about?" (e.g., frequency, incontinence)     Burning with urination  2. ONSET: "When did the  ________  start?"     A week ago.  Constant every time I urinate 3. PAIN: "Is there any pain?" If so, ask: "How bad is it?" (Scale: 1-10; mild, moderate, severe)     No abd pain 4. CAUSE: "What do you think is causing the symptoms?"     No.   I was having sex and the condom broke.  THese symptoms started about a week after that. 5. OTHER SYMPTOMS: "Do you have any other symptoms?" (e.g., fever, flank pain, blood in urine, pain with urination)     No symptoms.   I'm concerned about being exposed to an STD. 6. PREGNANCY: "Is there any chance you are pregnant?" "When was your last menstrual period?"     N/A  Protocols used: URINATION PAIN - MALE-A-AH, URINARY SYMPTOMS-A-AH

## 2017-09-07 ENCOUNTER — Ambulatory Visit (INDEPENDENT_AMBULATORY_CARE_PROVIDER_SITE_OTHER): Payer: 59 | Admitting: Unknown Physician Specialty

## 2017-09-07 ENCOUNTER — Encounter: Payer: Self-pay | Admitting: Unknown Physician Specialty

## 2017-09-07 VITALS — BP 118/79 | HR 79 | Temp 98.4°F | Wt 267.0 lb

## 2017-09-07 DIAGNOSIS — J3089 Other allergic rhinitis: Secondary | ICD-10-CM | POA: Diagnosis not present

## 2017-09-07 DIAGNOSIS — Z7251 High risk heterosexual behavior: Secondary | ICD-10-CM | POA: Diagnosis not present

## 2017-09-07 DIAGNOSIS — R300 Dysuria: Secondary | ICD-10-CM | POA: Diagnosis not present

## 2017-09-07 LAB — UA/M W/RFLX CULTURE, ROUTINE
Bilirubin, UA: NEGATIVE
Ketones, UA: NEGATIVE
Leukocytes, UA: NEGATIVE
NITRITE UA: NEGATIVE
PH UA: 7 (ref 5.0–7.5)
PROTEIN UA: NEGATIVE
RBC, UA: NEGATIVE
SPEC GRAV UA: 1.01 (ref 1.005–1.030)
Urobilinogen, Ur: 0.2 mg/dL (ref 0.2–1.0)

## 2017-09-07 NOTE — Assessment & Plan Note (Signed)
Discussed allergen avoidance.  Use a non-sedating OTC allergy medication

## 2017-09-07 NOTE — Progress Notes (Signed)
BP 118/79   Pulse 79   Temp 98.4 F (36.9 C) (Oral)   Wt 267 lb (121.1 kg)   SpO2 97%   BMI 48.06 kg/m    Subjective:    Patient ID: Lance Kim, male    DOB: [birth year], 46 y.o.   MRN: [id number]  HPI: Lance Kim is a 46 y.o. male  Chief Complaint  Patient presents with  . Urinary Tract Infection    pt states he has had burning with urination for a week   . Labs Only    pt states he would like to have STD labs done    Dysuria   This is a new problem. The current episode started in the past 7 days. The problem occurs every urination. The problem has been gradually improving. The quality of the pain is described as burning. There has been no fever. He is sexually active. There is no history of pyelonephritis. Associated symptoms include urgency. Pertinent negatives include no chills, discharge, flank pain, frequency, hematuria, hesitancy, nausea, sweats or vomiting. He has tried nothing for the symptoms.  Allergic Reaction  This is a new problem. Episode onset: 2 weeks. The problem occurs daily (in the morning and has "a sneezing fit"). The problem is unchanged. Pertinent negatives include no abdominal pain, chest pain, chest pressure, coughing, difficulty breathing, eye itching, eye redness, eye watering, itching, rash or vomiting. There is no swelling present. Past treatments include nothing. His past medical history is significant for seasonal allergies. There is no history of food allergies.    Relevant past medical, surgical, family and social history reviewed and updated as indicated. Interim medical history since our last visit reviewed. Allergies and medications reviewed and updated.  Review of Systems  Constitutional: Negative for chills.  Eyes: Negative for redness and itching.  Respiratory: Negative for cough.   Cardiovascular: Negative for chest pain.  Gastrointestinal: Negative for abdominal pain, nausea and vomiting.  Genitourinary: Positive for dysuria and  urgency. Negative for flank pain, frequency, hematuria and hesitancy.  Skin: Negative for itching and rash.  Allergic/Immunologic: Negative for food allergies.    Per HPI unless specifically indicated above     Objective:    BP 118/79   Pulse 79   Temp 98.4 F (36.9 C) (Oral)   Wt 267 lb (121.1 kg)   SpO2 97%   BMI 48.06 kg/m   Wt Readings from Last 3 Encounters:  09/07/17 267 lb (121.1 kg)  08/14/17 264 lb 12.8 oz (120.1 kg)  04/10/17 268 lb 14.4 oz (122 kg)    Physical Exam  Constitutional: He is oriented to person, place, and time. He appears well-developed and well-nourished. No distress.  HENT:  Head: Normocephalic and atraumatic.  Eyes: Conjunctivae and lids are normal. Right eye exhibits no discharge. Left eye exhibits no discharge. No scleral icterus.  Neck: Normal range of motion. Neck supple. No JVD present. Carotid bruit is not present.  Cardiovascular: Normal rate, regular rhythm and normal heart sounds.  Pulmonary/Chest: Effort normal and breath sounds normal. No respiratory distress.  Abdominal: Normal appearance. There is no splenomegaly or hepatomegaly.  Genitourinary: Prostate normal. Rectal exam shows guaiac negative stool. No penile tenderness.  Musculoskeletal: Normal range of motion.  Neurological: He is alert and oriented to person, place, and time.  Skin: Skin is warm, dry and intact. No rash noted. No pallor.  Psychiatric: He has a normal mood and affect. His behavior is normal. Judgment and thought content normal.    Results  for orders placed or performed in visit on 04/10/17  CBC with Differential/Platelet  Result Value Ref Range   WBC 7.7 3.4 - 10.8 x10E3/uL   RBC 5.05 4.14 - 5.80 x10E6/uL   Hemoglobin 14.4 13.0 - 17.7 g/dL   Hematocrit 42.4 37.5 - 51.0 %   MCV 84 79 - 97 fL   MCH 28.5 26.6 - 33.0 pg   MCHC 34.0 31.5 - 35.7 g/dL   RDW 15.1 12.3 - 15.4 %   Platelets 256 150 - 379 x10E3/uL   Neutrophils 51 Not Estab. %   Lymphs 39 Not  Estab. %   Monocytes 7 Not Estab. %   Eos 3 Not Estab. %   Basos 0 Not Estab. %   Neutrophils Absolute 3.9 1.4 - 7.0 x10E3/uL   Lymphocytes Absolute 3.0 0.7 - 3.1 x10E3/uL   Monocytes Absolute 0.5 0.1 - 0.9 x10E3/uL   EOS (ABSOLUTE) 0.2 0.0 - 0.4 x10E3/uL   Basophils Absolute 0.0 0.0 - 0.2 x10E3/uL   Immature Granulocytes 0 Not Estab. %   Immature Grans (Abs) 0.0 0.0 - 0.1 x10E3/uL  Comprehensive metabolic panel  Result Value Ref Range   Glucose 98 65 - 99 mg/dL   BUN 10 6 - 24 mg/dL   Creatinine, Ser 1.06 0.76 - 1.27 mg/dL   GFR calc non Af Amer 83 >59 mL/min/1.73   GFR calc Af Amer 96 >59 mL/min/1.73   BUN/Creatinine Ratio 9 9 - 20   Sodium 138 134 - 144 mmol/L   Potassium 4.2 3.5 - 5.2 mmol/L   Chloride 100 96 - 106 mmol/L   CO2 25 20 - 29 mmol/L   Calcium 9.5 8.7 - 10.2 mg/dL   Total Protein 7.4 6.0 - 8.5 g/dL   Albumin 4.2 3.5 - 5.5 g/dL   Globulin, Total 3.2 1.5 - 4.5 g/dL   Albumin/Globulin Ratio 1.3 1.2 - 2.2   Bilirubin Total 0.3 0.0 - 1.2 mg/dL   Alkaline Phosphatase 83 39 - 117 IU/L   AST 22 0 - 40 IU/L   ALT 19 0 - 44 IU/L  Lipid Panel w/o Chol/HDL Ratio  Result Value Ref Range   Cholesterol, Total 118 100 - 199 mg/dL   Triglycerides 197 (H) 0 - 149 mg/dL   HDL 31 (L) >39 mg/dL   VLDL Cholesterol Cal 39 5 - 40 mg/dL   LDL Calculated 48 0 - 99 mg/dL  TSH  Result Value Ref Range   TSH 1.120 0.450 - 4.500 uIU/mL      Assessment & Plan:   Problem List Items Addressed This Visit      Unprioritized   Allergic rhinitis    Discussed allergen avoidance.  Use a non-sedating OTC allergy medication       Other Visit Diagnoses    Burning with urination    -  Primary   symptoms are getting better.  Prostate non-tender.  Urine is negative.  since improving, no treatment at this time besides pushing fluids   Relevant Orders   UA/M w/rflx Culture, Routine   High risk heterosexual behavior       STD testing as requested and symptoms of burning   Relevant Orders     GC/Chlamydia Probe Amp   HIV antibody   RPR   HSV(herpes simplex vrs) 1+2 ab-IgG       Follow up plan: Return if symptoms worsen or fail to improve.

## 2017-09-08 LAB — RPR: RPR: NONREACTIVE

## 2017-09-08 LAB — HIV ANTIBODY (ROUTINE TESTING W REFLEX): HIV SCREEN 4TH GENERATION: NONREACTIVE

## 2017-09-08 LAB — HSV(HERPES SIMPLEX VRS) I + II AB-IGG: HSV 1 GLYCOPROTEIN G AB, IGG: 27.7 {index} — AB (ref 0.00–0.90)

## 2017-09-09 ENCOUNTER — Ambulatory Visit: Payer: 59 | Admitting: Unknown Physician Specialty

## 2017-09-10 DIAGNOSIS — M25561 Pain in right knee: Secondary | ICD-10-CM | POA: Diagnosis not present

## 2017-09-10 DIAGNOSIS — R609 Edema, unspecified: Secondary | ICD-10-CM | POA: Diagnosis not present

## 2017-09-10 DIAGNOSIS — M222X1 Patellofemoral disorders, right knee: Secondary | ICD-10-CM | POA: Diagnosis not present

## 2017-09-10 LAB — GC/CHLAMYDIA PROBE AMP

## 2017-09-11 ENCOUNTER — Encounter: Payer: Self-pay | Admitting: Unknown Physician Specialty

## 2017-09-11 LAB — GC/CHLAMYDIA PROBE AMP
CHLAMYDIA, DNA PROBE: NEGATIVE
NEISSERIA GONORRHOEAE BY PCR: NEGATIVE

## 2017-09-12 ENCOUNTER — Other Ambulatory Visit: Payer: Self-pay | Admitting: Unknown Physician Specialty

## 2017-09-14 DIAGNOSIS — R609 Edema, unspecified: Secondary | ICD-10-CM | POA: Diagnosis not present

## 2017-09-14 DIAGNOSIS — M222X1 Patellofemoral disorders, right knee: Secondary | ICD-10-CM | POA: Diagnosis not present

## 2017-09-14 DIAGNOSIS — M25561 Pain in right knee: Secondary | ICD-10-CM | POA: Diagnosis not present

## 2017-09-16 DIAGNOSIS — R609 Edema, unspecified: Secondary | ICD-10-CM | POA: Diagnosis not present

## 2017-09-16 DIAGNOSIS — M222X1 Patellofemoral disorders, right knee: Secondary | ICD-10-CM | POA: Diagnosis not present

## 2017-09-16 DIAGNOSIS — M25561 Pain in right knee: Secondary | ICD-10-CM | POA: Diagnosis not present

## 2017-09-17 ENCOUNTER — Ambulatory Visit (INDEPENDENT_AMBULATORY_CARE_PROVIDER_SITE_OTHER): Payer: 59 | Admitting: Family Medicine

## 2017-09-17 ENCOUNTER — Encounter: Payer: Self-pay | Admitting: Family Medicine

## 2017-09-17 VITALS — BP 113/83 | HR 73 | Wt 247.0 lb

## 2017-09-17 DIAGNOSIS — S46912A Strain of unspecified muscle, fascia and tendon at shoulder and upper arm level, left arm, initial encounter: Secondary | ICD-10-CM | POA: Diagnosis not present

## 2017-09-17 MED ORDER — PREDNISONE 10 MG PO TABS: ORAL_TABLET | ORAL | 0 refills | Status: DC

## 2017-09-17 MED ORDER — CYCLOBENZAPRINE HCL 10 MG PO TABS: 10.0000 mg | ORAL_TABLET | Freq: Three times a day (TID) | ORAL | 0 refills | Status: DC | PRN

## 2017-09-17 NOTE — Progress Notes (Signed)
   BP 113/83   Pulse 73   Wt 247 lb (112 kg)   SpO2 98%   BMI 44.46 kg/m    Subjective:    Patient ID: Lance Kim, male    DOB: 07-16-1971, 47 y.o.   MRN: [id number]  HPI: Lance Kim is a 47 y.o. male  Chief Complaint  Patient presents with  . Shoulder Pain    Left. Constantly.    Left shoulder pain constantly x 5 days that started when he lifted a big bed. Pain is radiating all the way down to left hand, numbness down into elbow. Taking 400 mg ibuprofen twice daily with no relief. Denies hx of neck injuries, past shoulder injuries or surgeries.   Relevant past medical, surgical, family and social history reviewed and updated as indicated. Interim medical history since our last visit reviewed. Allergies and medications reviewed and updated.  Review of Systems  Constitutional: Negative.   Respiratory: Negative.   Cardiovascular: Negative.   Gastrointestinal: Negative.   Genitourinary: Negative.   Musculoskeletal: Positive for arthralgias.  Neurological: Negative.   Psychiatric/Behavioral: Negative.     Per HPI unless specifically indicated above     Objective:    BP 113/83   Pulse 73   Wt 247 lb (112 kg)   SpO2 98%   BMI 44.46 kg/m   Wt Readings from Last 3 Encounters:  09/17/17 247 lb (112 kg)  09/07/17 267 lb (121.1 kg)  08/14/17 264 lb 12.8 oz (120.1 kg)    Physical Exam  Constitutional: He is oriented to person, place, and time. He appears well-developed and well-nourished.  HENT:  Head: Atraumatic.  Eyes: Conjunctivae are normal. Pupils are equal, round, and reactive to light.  Neck: Normal range of motion. Neck supple.  Cardiovascular: Normal rate, normal heart sounds and intact distal pulses.  Pulmonary/Chest: Effort normal. No respiratory distress.  Musculoskeletal: He exhibits tenderness (mild ttp over entire left deltoid). He exhibits no edema or deformity.  ROM exam limited by pt discomfort Grip strength reduced left hand  Neurological:  He is alert and oriented to person, place, and time.  Neurovascularly intact b/l UEs  Skin: Skin is warm and dry. No erythema.  Psychiatric: He has a normal mood and affect. His behavior is normal.  Nursing note and vitals reviewed.      Assessment & Plan:   Problem List Items Addressed This Visit    None    Visit Diagnoses    Strain of left shoulder, initial encounter    -  Primary   Will tx with prednisone taper and flexeril prn. Can have meloxicam prn following prednisone pack, tylenol prn in meantime. Soaks, heating pad, gentle stretches       Follow up plan: Return if symptoms worsen or fail to improve.

## 2017-09-17 NOTE — Patient Instructions (Addendum)
Can take tylenol alongside the prednisone, can resume ibuprofen 600 - 800 mg 3 times a day as needed once the prednisone is finished  Can take 2 - 500 mg tylenol tablets up to 4 times daily

## 2017-09-22 DIAGNOSIS — M222X1 Patellofemoral disorders, right knee: Secondary | ICD-10-CM | POA: Diagnosis not present

## 2017-09-22 DIAGNOSIS — M25561 Pain in right knee: Secondary | ICD-10-CM | POA: Diagnosis not present

## 2017-09-22 DIAGNOSIS — R609 Edema, unspecified: Secondary | ICD-10-CM | POA: Diagnosis not present

## 2017-09-24 DIAGNOSIS — M222X1 Patellofemoral disorders, right knee: Secondary | ICD-10-CM | POA: Diagnosis not present

## 2017-09-24 DIAGNOSIS — M25561 Pain in right knee: Secondary | ICD-10-CM | POA: Diagnosis not present

## 2017-09-28 DIAGNOSIS — M222X1 Patellofemoral disorders, right knee: Secondary | ICD-10-CM | POA: Diagnosis not present

## 2017-09-28 DIAGNOSIS — M25561 Pain in right knee: Secondary | ICD-10-CM | POA: Diagnosis not present

## 2017-09-30 DIAGNOSIS — M222X1 Patellofemoral disorders, right knee: Secondary | ICD-10-CM | POA: Diagnosis not present

## 2017-09-30 DIAGNOSIS — M25561 Pain in right knee: Secondary | ICD-10-CM | POA: Diagnosis not present

## 2017-09-30 DIAGNOSIS — R609 Edema, unspecified: Secondary | ICD-10-CM | POA: Diagnosis not present

## 2017-10-01 DIAGNOSIS — M2550 Pain in unspecified joint: Secondary | ICD-10-CM | POA: Insufficient documentation

## 2017-10-01 DIAGNOSIS — L409 Psoriasis, unspecified: Secondary | ICD-10-CM | POA: Diagnosis not present

## 2017-10-01 DIAGNOSIS — Z6841 Body Mass Index (BMI) 40.0 and over, adult: Secondary | ICD-10-CM | POA: Insufficient documentation

## 2017-10-01 DIAGNOSIS — G8929 Other chronic pain: Secondary | ICD-10-CM | POA: Insufficient documentation

## 2017-10-01 DIAGNOSIS — M5441 Lumbago with sciatica, right side: Secondary | ICD-10-CM | POA: Diagnosis not present

## 2017-10-05 DIAGNOSIS — M25561 Pain in right knee: Secondary | ICD-10-CM | POA: Diagnosis not present

## 2017-10-05 DIAGNOSIS — R609 Edema, unspecified: Secondary | ICD-10-CM | POA: Diagnosis not present

## 2017-10-05 DIAGNOSIS — M222X1 Patellofemoral disorders, right knee: Secondary | ICD-10-CM | POA: Diagnosis not present

## 2017-10-07 DIAGNOSIS — R609 Edema, unspecified: Secondary | ICD-10-CM | POA: Diagnosis not present

## 2017-10-07 DIAGNOSIS — M25561 Pain in right knee: Secondary | ICD-10-CM | POA: Diagnosis not present

## 2017-10-07 DIAGNOSIS — M222X1 Patellofemoral disorders, right knee: Secondary | ICD-10-CM | POA: Diagnosis not present

## 2017-10-09 ENCOUNTER — Ambulatory Visit: Payer: 59 | Admitting: Unknown Physician Specialty

## 2017-10-09 ENCOUNTER — Ambulatory Visit
Admission: RE | Admit: 2017-10-09 | Discharge: 2017-10-09 | Disposition: A | Discharge status: Home / Self Care | Payer: 59 | Source: Ambulatory Visit | Attending: Internal Medicine | Admitting: Internal Medicine

## 2017-10-09 ENCOUNTER — Other Ambulatory Visit: Payer: Self-pay | Admitting: Internal Medicine

## 2017-10-09 DIAGNOSIS — M5441 Lumbago with sciatica, right side: Principal | ICD-10-CM

## 2017-10-09 DIAGNOSIS — G8929 Other chronic pain: Secondary | ICD-10-CM | POA: Diagnosis present

## 2017-10-09 DIAGNOSIS — L409 Psoriasis, unspecified: Secondary | ICD-10-CM

## 2017-10-09 DIAGNOSIS — M4316 Spondylolisthesis, lumbar region: Secondary | ICD-10-CM | POA: Diagnosis not present

## 2017-10-09 DIAGNOSIS — M5136 Other intervertebral disc degeneration, lumbar region: Secondary | ICD-10-CM | POA: Insufficient documentation

## 2017-10-09 DIAGNOSIS — M545 Low back pain: Secondary | ICD-10-CM | POA: Diagnosis not present

## 2017-10-12 ENCOUNTER — Ambulatory Visit
Admission: RE | Admit: 2017-10-12 | Discharge: 2017-10-12 | Disposition: A | Discharge status: Home / Self Care | Payer: 59 | Source: Ambulatory Visit | Attending: Unknown Physician Specialty | Admitting: Unknown Physician Specialty

## 2017-10-12 ENCOUNTER — Encounter: Payer: Self-pay | Admitting: Unknown Physician Specialty

## 2017-10-12 ENCOUNTER — Ambulatory Visit (INDEPENDENT_AMBULATORY_CARE_PROVIDER_SITE_OTHER): Payer: 59 | Admitting: Unknown Physician Specialty

## 2017-10-12 DIAGNOSIS — K130 Diseases of lips: Secondary | ICD-10-CM

## 2017-10-12 DIAGNOSIS — F325 Major depressive disorder, single episode, in full remission: Secondary | ICD-10-CM | POA: Diagnosis not present

## 2017-10-12 DIAGNOSIS — M5412 Radiculopathy, cervical region: Secondary | ICD-10-CM

## 2017-10-12 DIAGNOSIS — M62830 Muscle spasm of back: Secondary | ICD-10-CM | POA: Diagnosis not present

## 2017-10-12 DIAGNOSIS — I10 Essential (primary) hypertension: Secondary | ICD-10-CM | POA: Diagnosis not present

## 2017-10-12 DIAGNOSIS — M50323 Other cervical disc degeneration at C6-C7 level: Secondary | ICD-10-CM | POA: Diagnosis not present

## 2017-10-12 DIAGNOSIS — M4802 Spinal stenosis, cervical region: Secondary | ICD-10-CM | POA: Diagnosis not present

## 2017-10-12 MED ORDER — METHYLPREDNISOLONE 4 MG PO TBPK: ORAL_TABLET | ORAL | 0 refills | Status: DC

## 2017-10-12 MED ORDER — NYSTATIN 100000 UNIT/GM EX OINT: 1.0000 "application " | TOPICAL_OINTMENT | Freq: Two times a day (BID) | CUTANEOUS | 0 refills | Status: DC

## 2017-10-12 NOTE — Assessment & Plan Note (Signed)
Stable, continue present medications.   

## 2017-10-12 NOTE — Assessment & Plan Note (Addendum)
Nystatin cream.nIf no effect will let me know what the dentist gave

## 2017-10-12 NOTE — Progress Notes (Addendum)
BP 116/80   Pulse 91   Temp 98.4 F (36.9 C) (Oral)   Wt 265 lb 3.2 oz (120.3 kg)   SpO2 98%   BMI 47.73 kg/m    Subjective:    Patient ID: Lance Kim, male    DOB: 1971/01/31, 47 y.o.   MRN: [id number]  HPI: Lance Kim is a 47 y.o. male  Chief Complaint  Patient presents with  . Depression  . Hypertension   Depression States "I'm good"  But having some physical problems that is bothering him.   Depression screen [facility] 2/9 10/12/2017 04/10/2017 10/08/2016 04/07/2016 08/27/2015  Decreased Interest 0 0 2 0 0  Down, Depressed, Hopeless 0 0 0 0 0  PHQ - 2 Score 0 0 2 0 0  Altered sleeping 2 0 0 - -  Tired, decreased energy 0 1 1 - -  Change in appetite 1 1 1  - -  Feeling bad or failure about yourself  - 0 0 - -  Trouble concentrating 0 0 0 - -  Moving slowly or fidgety/restless 0 0 0 - -  Suicidal thoughts 0 0 0 - -  PHQ-9 Score 3 2 4  - -   Shoulder Pain   The pain is present in the right shoulder and right arm. This is a new (seen last week and given Prednisone, Flexeril, and Meloxicam) problem. Episode onset: 2 weeks. There has been no history of extremity trauma (happened after helping carry a mattress). The problem occurs constantly. The quality of the pain is described as aching, dull and pounding. Pertinent negatives include no fever, inability to bear weight, itching, joint locking, joint swelling, limited range of motion, numbness, stiffness or tingling. The symptoms are aggravated by lying down. Treatments tried: see above. The treatment provided no relief.   Hypertension Using medications without difficulty Average home BPs not checking   No problems or lightheadedness No chest pain with exertion or shortness of breath No Edema  Cracking lips Bilateral cracking of his lips in the corners.  Took something in the past but not sure what it was.     Relevant past medical, surgical, family and social history reviewed and updated as indicated. Interim medical  history since our last visit reviewed. Allergies and medications reviewed and updated.  Review of Systems  Constitutional: Negative for fever.  Musculoskeletal: Negative for stiffness.  Skin: Negative for itching.  Neurological: Negative for tingling and numbness.    Per HPI unless specifically indicated above     Objective:    BP 116/80   Pulse 91   Temp 98.4 F (36.9 C) (Oral)   Wt 265 lb 3.2 oz (120.3 kg)   SpO2 98%   BMI 47.73 kg/m   Wt Readings from Last 3 Encounters:  10/12/17 265 lb 3.2 oz (120.3 kg)  09/17/17 247 lb (112 kg)  09/07/17 267 lb (121.1 kg)    Physical Exam  Constitutional: He is oriented to person, place, and time. He appears well-developed and well-nourished. No distress.  HENT:  Head: Normocephalic and atraumatic.  Eyes: Conjunctivae and lids are normal. Right eye exhibits no discharge. Left eye exhibits no discharge. No scleral icterus.  Neck: Normal range of motion. Neck supple. No JVD present. Carotid bruit is not present.  Cardiovascular: Normal rate, regular rhythm and normal heart sounds.  Pulmonary/Chest: Effort normal and breath sounds normal. No respiratory distress.  Abdominal: Normal appearance. There is no splenomegaly or hepatomegaly.  Musculoskeletal: Normal range of motion. He exhibits no  edema, tenderness or deformity.       Left shoulder: He exhibits normal range of motion, no tenderness, no bony tenderness, no swelling, no deformity and no pain.  Neurological: He is alert and oriented to person, place, and time.  Skin: Skin is warm, dry and intact. No rash noted. No pallor.  Psychiatric: He has a normal mood and affect. His behavior is normal. Judgment and thought content normal.      Assessment & Plan:   Problem List Items Addressed This Visit      Unprioritized   Benign essential HTN    Stable, continue present medications.        Cervical radiculopathy    Left side.  Radiates down arm and into thumb.  Medrol dose pack.   C spine x-ray.  Sees rheumatology. May need MRI and referral to orthopedics.  He will check first with rheumatologist      Relevant Orders   DG Cervical Spine Complete   Depression    Stable, continue present medications.        Lip fissure    Nystatin cream.nIf no effect will let me know what the dentist gave          Follow up plan: Return in about 6 months (around 04/11/2018), or if symptoms worsen or fail to improve.

## 2017-10-12 NOTE — Assessment & Plan Note (Addendum)
Left side.  Radiates down arm and into thumb.  Medrol dose pack.  C spine x-ray.  Sees rheumatology. May need MRI and referral to orthopedics.  He will check first with rheumatologist

## 2017-10-14 DIAGNOSIS — M2550 Pain in unspecified joint: Secondary | ICD-10-CM | POA: Diagnosis not present

## 2017-10-14 DIAGNOSIS — L409 Psoriasis, unspecified: Secondary | ICD-10-CM | POA: Diagnosis not present

## 2017-10-14 DIAGNOSIS — M25561 Pain in right knee: Secondary | ICD-10-CM | POA: Diagnosis not present

## 2017-10-17 ENCOUNTER — Other Ambulatory Visit: Payer: Self-pay | Admitting: Unknown Physician Specialty

## 2017-10-23 DIAGNOSIS — R609 Edema, unspecified: Secondary | ICD-10-CM | POA: Diagnosis not present

## 2017-10-23 DIAGNOSIS — M222X1 Patellofemoral disorders, right knee: Secondary | ICD-10-CM | POA: Diagnosis not present

## 2017-10-23 DIAGNOSIS — M25561 Pain in right knee: Secondary | ICD-10-CM | POA: Diagnosis not present

## 2017-11-17 NOTE — Addendum Note (Signed)
Encounter addended by: Helene Kelp, RT on: 11/17/2017 3:29 PM  Actions taken: Imaging Exam begun, Image imported

## 2017-11-17 NOTE — Addendum Note (Signed)
Encounter addended by: Helene Kelp, RT on: 11/17/2017 3:28 PM  Actions taken: Imaging Exam begun, Image imported

## 2017-12-09 ENCOUNTER — Other Ambulatory Visit: Payer: Self-pay | Admitting: Unknown Physician Specialty

## 2018-01-01 ENCOUNTER — Encounter: Payer: Self-pay | Admitting: Family Medicine

## 2018-01-01 ENCOUNTER — Ambulatory Visit (INDEPENDENT_AMBULATORY_CARE_PROVIDER_SITE_OTHER): Payer: 59 | Admitting: Family Medicine

## 2018-01-01 VITALS — BP 114/76 | HR 76 | Temp 98.1°F | Wt 264.0 lb

## 2018-01-01 DIAGNOSIS — R062 Wheezing: Secondary | ICD-10-CM

## 2018-01-01 DIAGNOSIS — J3089 Other allergic rhinitis: Secondary | ICD-10-CM

## 2018-01-01 MED ORDER — BENZONATATE 200 MG PO CAPS: 200.0000 mg | ORAL_CAPSULE | Freq: Three times a day (TID) | ORAL | 0 refills | Status: DC | PRN

## 2018-01-01 MED ORDER — PREDNISONE 10 MG PO TABS: ORAL_TABLET | ORAL | 0 refills | Status: DC

## 2018-01-01 MED ORDER — CETIRIZINE HCL 10 MG PO TABS: 10.0000 mg | ORAL_TABLET | Freq: Every day | ORAL | 11 refills | Status: DC

## 2018-01-01 MED ORDER — FLUTICASONE PROPIONATE 50 MCG/ACT NA SUSP: 2.0000 | Freq: Two times a day (BID) | NASAL | 11 refills | Status: DC

## 2018-01-01 NOTE — Patient Instructions (Signed)
Flonase and zyrtec twice daily, humidifier, coricidin, sinus rinses, vapor rub, allergy eye drops as needed

## 2018-01-01 NOTE — Assessment & Plan Note (Signed)
Significant exacerbation currently, will start BID zyrtec and flonase, sinus rinses, humidifier, allergy eye drops. Can continue coricidin prn

## 2018-01-01 NOTE — Progress Notes (Signed)
BP 114/76   Pulse 76   Temp 98.1 F (36.7 C) (Oral)   Wt 264 lb (119.7 kg)   SpO2 97%   BMI 47.52 kg/m    Subjective:    Patient ID: Lance Kim, male    DOB: 1971/07/21, 47 y.o.   MRN: [id number]  HPI: [name] is a 47 y.o. male  Chief Complaint  Patient presents with  . Nasal Congestion   Cough, sneezing, nasal congestion, sore throat, headache, and wheezing x almost a week. Taking coricidin the whole week with no relief. Also having some chills, fatigue, and some achiness (states that's constant though so unsure if related). Does have a hx of allergies, not on any allergy regimen. Unsure what he can take with his HTN.   Relevant past medical, surgical, family and social history reviewed and updated as indicated. Interim medical history since our last visit reviewed. Allergies and medications reviewed and updated.  Review of Systems  Per HPI unless specifically indicated above     Objective:    BP 114/76   Pulse 76   Temp 98.1 F (36.7 C) (Oral)   Wt 264 lb (119.7 kg)   SpO2 97%   BMI 47.52 kg/m   Wt Readings from Last 3 Encounters:  01/01/18 264 lb (119.7 kg)  10/12/17 265 lb 3.2 oz (120.3 kg)  09/17/17 247 lb (112 kg)    Physical Exam  Constitutional: He is oriented to person, place, and time. He appears well-developed and well-nourished. No distress.  HENT:  Head: Atraumatic.  Right Ear: External ear normal.  Left Ear: External ear normal.  Nasal mucosa significantly erythematous and edematous Oropharynx erythematous  Eyes: Pupils are equal, round, and reactive to light. EOM are normal. Right eye exhibits discharge (clear). Left eye exhibits discharge (clear).  Conjunctiva mildly injected  Neck: Normal range of motion. Neck supple.  Cardiovascular: Normal rate and regular rhythm.  Pulmonary/Chest: Effort normal. No respiratory distress. He has wheezes (minimal).  Musculoskeletal: Normal range of motion.  Neurological: He is alert and oriented  to person, place, and time.  Skin: Skin is warm and dry.  Psychiatric: He has a normal mood and affect. His behavior is normal.  Nursing note and vitals reviewed.   Results for orders placed or performed in visit on 09/07/17  GC/Chlamydia Probe Amp  Result Value Ref Range   Chlamydia trachomatis, NAA CANCELED    Neisseria gonorrhoeae by PCR CANCELED   GC/Chlamydia Probe Amp  Result Value Ref Range   Chlamydia trachomatis, NAA Negative Negative   Neisseria gonorrhoeae by PCR Negative Negative  UA/M w/rflx Culture, Routine  Result Value Ref Range   Specific Gravity, UA 1.010 1.005 - 1.030   pH, UA 7.0 5.0 - 7.5   Color, UA Yellow Yellow   Appearance Ur Clear Clear   Leukocytes, UA Negative Negative   Protein, UA Negative Negative/Trace   Glucose, UA Trace (A) Negative   Ketones, UA Negative Negative   RBC, UA Negative Negative   Bilirubin, UA Negative Negative   Urobilinogen, Ur 0.2 0.2 - 1.0 mg/dL   Nitrite, UA Negative Negative  HIV antibody  Result Value Ref Range   HIV Screen 4th Generation wRfx Non Reactive Non Reactive  RPR  Result Value Ref Range   RPR Ser Ql Non Reactive Non Reactive  HSV(herpes simplex vrs) 1+2 ab-IgG  Result Value Ref Range   HSV 1 Glycoprotein G Ab, IgG 27.70 (H) 0.00 - 0.90 index   HSV 2 IgG,  Type Spec <0.91 0.00 - 0.90 index      Assessment & Plan:   Problem List Items Addressed This Visit      Respiratory   Allergic rhinitis - Primary    Significant exacerbation currently, will start BID zyrtec and flonase, sinus rinses, humidifier, allergy eye drops. Can continue coricidin prn       Other Visit Diagnoses    Wheezing       Prednisone taper to help reduce allergy and inflammation flare. Humidifier, allergy regimen       Follow up plan: Return for as scheduled.

## 2018-01-12 DIAGNOSIS — M5136 Other intervertebral disc degeneration, lumbar region: Secondary | ICD-10-CM | POA: Insufficient documentation

## 2018-01-12 DIAGNOSIS — L409 Psoriasis, unspecified: Secondary | ICD-10-CM | POA: Diagnosis not present

## 2018-01-15 ENCOUNTER — Other Ambulatory Visit: Payer: Self-pay | Admitting: Unknown Physician Specialty

## 2018-03-07 ENCOUNTER — Other Ambulatory Visit: Payer: Self-pay | Admitting: Unknown Physician Specialty

## 2018-03-09 NOTE — Telephone Encounter (Signed)
Lisinopril-HCTZ refill Last Refill:12/10/17 # 90 Last OV: 04/10/17 PCP: Kathrine Haddock NP Pharmacy:[facility] [address]  Amlodipine refill Last Refill:12/10/17 # 90 Last OV: 04/10/17  Pt has upcoming OV 04/13/18

## 2018-04-06 ENCOUNTER — Other Ambulatory Visit: Payer: Self-pay | Admitting: Unknown Physician Specialty

## 2018-04-13 ENCOUNTER — Ambulatory Visit (INDEPENDENT_AMBULATORY_CARE_PROVIDER_SITE_OTHER): Payer: 59 | Admitting: Unknown Physician Specialty

## 2018-04-13 ENCOUNTER — Encounter: Payer: Self-pay | Admitting: Unknown Physician Specialty

## 2018-04-13 ENCOUNTER — Other Ambulatory Visit: Payer: Self-pay

## 2018-04-13 VITALS — BP 115/79 | HR 67 | Temp 98.6°F | Ht 62.6 in | Wt 260.4 lb

## 2018-04-13 DIAGNOSIS — E785 Hyperlipidemia, unspecified: Secondary | ICD-10-CM

## 2018-04-13 DIAGNOSIS — I10 Essential (primary) hypertension: Secondary | ICD-10-CM

## 2018-04-13 DIAGNOSIS — F325 Major depressive disorder, single episode, in full remission: Secondary | ICD-10-CM

## 2018-04-13 MED ORDER — AMLODIPINE BESYLATE 10 MG PO TABS: ORAL_TABLET | ORAL | 0 refills | Status: DC

## 2018-04-13 MED ORDER — FLUOXETINE HCL 20 MG PO CAPS: 20.0000 mg | ORAL_CAPSULE | Freq: Every day | ORAL | 1 refills | Status: DC

## 2018-04-13 MED ORDER — LISINOPRIL-HYDROCHLOROTHIAZIDE 20-12.5 MG PO TABS: 1.0000 | ORAL_TABLET | Freq: Every day | ORAL | 0 refills | Status: DC

## 2018-04-13 MED ORDER — ATORVASTATIN CALCIUM 20 MG PO TABS: 20.0000 mg | ORAL_TABLET | Freq: Every day | ORAL | 3 refills | Status: DC

## 2018-04-13 NOTE — Assessment & Plan Note (Signed)
Stable, continue present medications.   

## 2018-04-13 NOTE — Assessment & Plan Note (Signed)
Check labs and adjust statin accordingly

## 2018-04-13 NOTE — Progress Notes (Signed)
BP 115/79   Pulse 67   Temp 98.6 F (37 C) (Oral)   Ht 5' 2.6" (1.59 m)   Wt 260 lb 6.4 oz (118.1 kg)   SpO2 97%   BMI 46.72 kg/m    Subjective:    Patient ID: Lance Kim, male    DOB: 1970-11-09, 47 y.o.   MRN: [id number]  HPI: Lance Kim is a 47 y.o. male  Chief Complaint  Patient presents with  . Depression  . Hypertension   Hypertension Using medications without difficulty Average home BPs   No problems or lightheadedness No chest pain with exertion or shortness of breath No Edema   Hyperlipidemia Using medications without problems: No Muscle aches  Diet compliance: Exercise:  Depression Doing well with present medication.  Finds he prays a lot Depression screen [facility] 2/9 04/13/2018 10/12/2017 04/10/2017 10/08/2016 04/07/2016  Decreased Interest 0 0 0 2 0  Down, Depressed, Hopeless 0 0 0 0 0  PHQ - 2 Score 0 0 0 2 0  Altered sleeping 1 2 0 0 -  Tired, decreased energy 0 0 1 1 -  Change in appetite 0 1 1 1  -  Feeling bad or failure about yourself  0 - 0 0 -  Trouble concentrating 0 0 0 0 -  Moving slowly or fidgety/restless 0 0 0 0 -  Suicidal thoughts 0 0 0 0 -  PHQ-9 Score 1 3 2 4  -     Relevant past medical, surgical, family and social history reviewed and updated as indicated. Interim medical history since our last visit reviewed. Allergies and medications reviewed and updated.  Review of Systems  Per HPI unless specifically indicated above     Objective:    BP 115/79   Pulse 67   Temp 98.6 F (37 C) (Oral)   Ht 5' 2.6" (1.59 m)   Wt 260 lb 6.4 oz (118.1 kg)   SpO2 97%   BMI 46.72 kg/m   Wt Readings from Last 3 Encounters:  04/13/18 260 lb 6.4 oz (118.1 kg)  01/01/18 264 lb (119.7 kg)  10/12/17 265 lb 3.2 oz (120.3 kg)    Physical Exam  Constitutional: He is oriented to person, place, and time. He appears well-developed and well-nourished. No distress.  HENT:  Head: Normocephalic and atraumatic.  Eyes: Conjunctivae and lids  are normal. Right eye exhibits no discharge. Left eye exhibits no discharge. No scleral icterus.  Neck: Normal range of motion. Neck supple. No JVD present. Carotid bruit is not present.  Cardiovascular: Normal rate, regular rhythm and normal heart sounds.  Pulmonary/Chest: Effort normal and breath sounds normal. No respiratory distress.  Abdominal: Normal appearance. There is no splenomegaly or hepatomegaly.  Musculoskeletal: Normal range of motion.  Neurological: He is alert and oriented to person, place, and time.  Skin: Skin is warm, dry and intact. No rash noted. No pallor.  Psychiatric: He has a normal mood and affect. His behavior is normal. Judgment and thought content normal.    Results for orders placed or performed in visit on 09/07/17  GC/Chlamydia Probe Amp  Result Value Ref Range   Chlamydia trachomatis, NAA CANCELED    Neisseria gonorrhoeae by PCR CANCELED   GC/Chlamydia Probe Amp  Result Value Ref Range   Chlamydia trachomatis, NAA Negative Negative   Neisseria gonorrhoeae by PCR Negative Negative  UA/M w/rflx Culture, Routine  Result Value Ref Range   Specific Gravity, UA 1.010 1.005 - 1.030   pH, UA 7.0 5.0 -  7.5   Color, UA Yellow Yellow   Appearance Ur Clear Clear   Leukocytes, UA Negative Negative   Protein, UA Negative Negative/Trace   Glucose, UA Trace (A) Negative   Ketones, UA Negative Negative   RBC, UA Negative Negative   Bilirubin, UA Negative Negative   Urobilinogen, Ur 0.2 0.2 - 1.0 mg/dL   Nitrite, UA Negative Negative  HIV antibody  Result Value Ref Range   HIV Screen 4th Generation wRfx Non Reactive Non Reactive  RPR  Result Value Ref Range   RPR Ser Ql Non Reactive Non Reactive  HSV(herpes simplex vrs) 1+2 ab-IgG  Result Value Ref Range   HSV 1 Glycoprotein G Ab, IgG 27.70 (H) 0.00 - 0.90 index   HSV 2 IgG, Type Spec <0.91 0.00 - 0.90 index      Assessment & Plan:   Problem List Items Addressed This Visit      Unprioritized   Benign  essential HTN    Stable, continue present medications. \      Relevant Medications   amLODipine (NORVASC) 10 MG tablet   lisinopril-hydrochlorothiazide (PRINZIDE,ZESTORETIC) 20-12.5 MG tablet   atorvastatin (LIPITOR) 20 MG tablet   Other Relevant Orders   Comprehensive metabolic panel   Depression    Stable, continue present medications.        Relevant Medications   FLUoxetine (PROZAC) 20 MG capsule   Hyperlipemia - Primary    Check labs and adjust statin accordingly      Relevant Medications   amLODipine (NORVASC) 10 MG tablet   lisinopril-hydrochlorothiazide (PRINZIDE,ZESTORETIC) 20-12.5 MG tablet   atorvastatin (LIPITOR) 20 MG tablet   Other Relevant Orders   Lipid Panel w/o Chol/HDL Ratio       Follow up plan: Return in about 3 months (around 07/14/2018) for physical.

## 2018-05-06 ENCOUNTER — Other Ambulatory Visit: Payer: Self-pay | Admitting: Unknown Physician Specialty

## 2018-05-10 ENCOUNTER — Ambulatory Visit: Payer: Self-pay | Admitting: Unknown Physician Specialty

## 2018-05-10 DIAGNOSIS — Z79899 Other long term (current) drug therapy: Secondary | ICD-10-CM | POA: Diagnosis not present

## 2018-05-10 DIAGNOSIS — L400 Psoriasis vulgaris: Secondary | ICD-10-CM | POA: Diagnosis not present

## 2018-05-10 NOTE — Telephone Encounter (Signed)
Pt reports felt "Sharp pain" in pinky finger of left hand Friday. Joint became red and swollen, intermittent pain with movement 8/10. States may have been an insect bite, unsure. States redness and swelling are down, no warmth, pain decreased... "Once in a while with movement." Denies fever, rash, SOB. No drainage or open area. Pt reports he has been cleaning area and applying neosporin and dressing. Home care advise given; instructed pt to call back if symptoms continue or worsen, any fever occurs, warmth to area, drainage. Pt verbalizes understanding.   Reason for Disposition . Finger pain  Answer Assessment - Initial Assessment Questions 1. ONSET: "When did the pain start?"      Friday, possible insect bite. 2. LOCATION and RADIATION: "Where is the pain located?"  (e.g., fingertip, around nail, joint, entire  finger)      Knuckle 3. SEVERITY: "How bad is the pain?" "What does it keep you from doing?"   (Scale 1-10; or mild, moderate, severe)  - MILD - doesn't interfere with normal activities   - MODERATE - interferes with normal activities or awakens from sleep  - SEVERE - excruciating pain, unable to hold a glass of water or bend finger even a little     8/10 intermittent with movement on Friday, "occurring less now." 4. APPEARANCE: "What does the finger look like?" (e.g., redness, swelling, bruising, pallor)     Mild redness, swelling at joint "but better than Friday" 5. WORK OR EXERCISE: "Has there been any recent work or exercise that involved this part of the body?"    no 6. CAUSE: "What do you think is causing the pain?"     Unsure 7. AGGRAVATING FACTORS: "What makes the pain worse?" (e.g., using computer)     Movement 8. OTHER SYMPTOMS: "Do you have any other symptoms?" (e.g., fever, neck pain, numbness)     no  Protocols used: FINGER PAIN-A-AH

## 2018-07-14 ENCOUNTER — Encounter: Payer: Self-pay | Admitting: Nurse Practitioner

## 2018-07-14 ENCOUNTER — Ambulatory Visit (INDEPENDENT_AMBULATORY_CARE_PROVIDER_SITE_OTHER): Payer: 59 | Admitting: Nurse Practitioner

## 2018-07-14 VITALS — BP 111/72 | HR 71 | Temp 98.7°F | Ht 62.5 in | Wt 264.0 lb

## 2018-07-14 DIAGNOSIS — E785 Hyperlipidemia, unspecified: Secondary | ICD-10-CM | POA: Diagnosis not present

## 2018-07-14 DIAGNOSIS — G4733 Obstructive sleep apnea (adult) (pediatric): Secondary | ICD-10-CM | POA: Diagnosis not present

## 2018-07-14 DIAGNOSIS — Z23 Encounter for immunization: Secondary | ICD-10-CM

## 2018-07-14 DIAGNOSIS — Z0000 Encounter for general adult medical examination without abnormal findings: Secondary | ICD-10-CM | POA: Diagnosis not present

## 2018-07-14 DIAGNOSIS — F325 Major depressive disorder, single episode, in full remission: Secondary | ICD-10-CM

## 2018-07-14 DIAGNOSIS — I10 Essential (primary) hypertension: Secondary | ICD-10-CM

## 2018-07-14 DIAGNOSIS — L409 Psoriasis, unspecified: Secondary | ICD-10-CM | POA: Diagnosis not present

## 2018-07-14 LAB — MICROALBUMIN, URINE WAIVED
Creatinine, Urine Waived: 50 mg/dL (ref 10–300)
Microalb, Ur Waived: 10 mg/L (ref 0–19)
Microalb/Creat Ratio: <30 mg/g (ref <30)

## 2018-07-14 NOTE — Assessment & Plan Note (Signed)
Chronic, stable.  Continue medication regimen.

## 2018-07-14 NOTE — Assessment & Plan Note (Signed)
Sleep study ordered for [facility].

## 2018-07-14 NOTE — Assessment & Plan Note (Signed)
Continue to focus on diet and exercise.   

## 2018-07-14 NOTE — Assessment & Plan Note (Signed)
Chronic, stable.  Continue current medications.  CMP today.

## 2018-07-14 NOTE — Patient Instructions (Signed)

## 2018-07-14 NOTE — Assessment & Plan Note (Signed)
Chronic, stable.  Lipid panel today and adjust medications as needed.

## 2018-07-14 NOTE — Progress Notes (Addendum)
 BP 111/72   Pulse 71   Temp 98.7 F (37.1 C) (Oral)   Ht 5' 2.5" (1.588 m)   Wt 264 lb (119.7 kg)   SpO2 95%   BMI 47.52 kg/m    Subjective:    Patient ID: Lance Kim, male    DOB: 04/24/1971, 47 y.o.   MRN: [id number]  HPI: Lance Kim is a 47 y.o. male presents for annual physical  Chief Complaint  Patient presents with  . Annual Exam    pt would like to discuss about the sleep apnea test he got about a year ago   HYPERTENSION / HYPERLIPIDEMIA Satisfied with current treatment? yes Duration of hypertension: chronic BP monitoring frequency: rarely BP range: 110-130/70-80 BP medication side effects: no Past BP meds: unsure per patient report Duration of hyperlipidemia: chronic Cholesterol medication side effects: no Cholesterol supplements: none Past cholesterol medications: atorvastain (lipitor) Medication compliance: excellent compliance Aspirin: yes Recent stressors: no Recurrent headaches: no Visual changes: no Palpitations: no Dyspnea: no Chest pain: no Lower extremity edema: no Dizzy/lightheaded: no   PSORIASIS: Reports he used to use a cream which came in a big tub and worked better.  Showed provider picture.  Appears similar cream, but was in a jar vs tube, which he receives now.  Encouraged him to discuss this with pharmacy and alert provider if need different order to obtain jar.  He reports his psoriasis is well-controlled with cream.    DEPRESSION Mood status: controlled Satisfied with current treatment?: yes Symptom severity: mild  Duration of current treatment : chronic Side effects: no Medication compliance: excellent compliance Psychotherapy/counseling: no none Previous psychiatric medications:none Anxious mood: no Anhedonia: no Significant weight loss or gain: no Insomnia: yes hard to fall asleep Fatigue: no Feelings of worthlessness or guilt: no Impaired concentration/indecisiveness: no Suicidal ideations: no Hopelessness:  no Crying spells: no Depression screen PHQ 2/9 07/14/2018 04/13/2018 10/12/2017 04/10/2017 10/08/2016  Decreased Interest 0 0 0 0 2  Down, Depressed, Hopeless 0 0 0 0 0  PHQ - 2 Score 0 0 0 0 2  Altered sleeping 1 1 2 0 0  Tired, decreased energy 1 0 0 1 1  Change in appetite 0 0 1 1 1  Feeling bad or failure about yourself  0 0 - 0 0  Trouble concentrating 0 0 0 0 0  Moving slowly or fidgety/restless 0 0 0 0 0  Suicidal thoughts 0 0 0 0 0  PHQ-9 Score 2 1 3 2 4   GAD 7 : Generalized Anxiety Score 07/14/2018  Nervous, Anxious, on Edge 0  Control/stop worrying 0  Worry too much - different things 0  Trouble relaxing 0  Restless 0  Easily annoyed or irritable 0  Afraid - awful might happen 0  Total GAD 7 Score 0   OBESITY: No current loss.  Continues to focus on healthy diet and exercise, but has "busy" job.  Encouraged to continue to focus on diet and exercise.  OSA: Has not gone for sleep study and requests order.  Reports he frequently snores and often wakes up feeling fatigued.  Occasionally has morning headaches, but not consistent.  Was advised in past to obtain sleep study, but has not gone.     Relevant past medical, surgical, family and social history reviewed and updated as indicated. Interim medical history since our last visit reviewed. Allergies and medications reviewed and updated.  Review of Systems  Constitutional: Positive for fatigue. Negative for activity change and fever.         Fatigue in morning  HENT: Negative for congestion, ear pain, rhinorrhea, sinus pressure, sinus pain, sore throat, trouble swallowing and voice change.   Eyes: Negative for pain, discharge, redness and visual disturbance.  Respiratory: Negative for cough, chest tightness, shortness of breath and wheezing.        Snoring  Cardiovascular: Negative for chest pain, palpitations and leg swelling.  Gastrointestinal: Negative for abdominal distention, abdominal pain, constipation and diarrhea.   Endocrine: Negative for cold intolerance, polydipsia, polyphagia and polyuria.  Genitourinary: Negative for difficulty urinating.  Musculoskeletal: Negative for back pain, myalgias and neck pain.  Skin: Negative.   Allergic/Immunologic: Negative.   Neurological: Negative for dizziness, syncope, numbness and headaches.  Hematological: Negative.   Psychiatric/Behavioral: Negative for behavioral problems and suicidal ideas. The patient is not nervous/anxious.    Per HPI unless specifically indicated above     Objective:    BP 111/72   Pulse 71   Temp 98.7 F (37.1 C) (Oral)   Ht 5' 2.5" (1.588 m)   Wt 264 lb (119.7 kg)   SpO2 95%   BMI 47.52 kg/m   Wt Readings from Last 3 Encounters:  07/14/18 264 lb (119.7 kg)  04/13/18 260 lb 6.4 oz (118.1 kg)  01/01/18 264 lb (119.7 kg)    Physical Exam  Constitutional: He is oriented to person, place, and time. He appears well-developed and well-nourished.  HENT:  Head: Normocephalic and atraumatic.  Right Ear: Hearing, tympanic membrane, external ear and ear canal normal.  Left Ear: Hearing, tympanic membrane, external ear and ear canal normal.  Nose: Nose normal.  Mouth/Throat: Uvula is midline, oropharynx is clear and moist and mucous membranes are normal.  Eyes: Pupils are equal, round, and reactive to light. Conjunctivae, EOM and lids are normal. Right eye exhibits no discharge. Left eye exhibits no discharge.  Neck: Normal range of motion. Neck supple. No JVD present. Carotid bruit is not present. No thyromegaly present.  Cardiovascular: Normal rate, regular rhythm, S1 normal, S2 normal, normal heart sounds and intact distal pulses.  No murmur heard. Pulmonary/Chest: Effort normal and breath sounds normal.  Abdominal: Soft. Bowel sounds are normal. There is no splenomegaly or hepatomegaly.  Obese  Genitourinary: Rectum normal, prostate normal and penis normal.  Musculoskeletal: Normal range of motion.  Lymphadenopathy:    He has  no cervical adenopathy.  Neurological: He is alert and oriented to person, place, and time. He has normal strength and normal reflexes. No cranial nerve deficit. He displays a negative Romberg sign.  Reflex Scores:      Brachioradialis reflexes are 2+ on the right side and 2+ on the left side.      Patellar reflexes are 2+ on the right side and 2+ on the left side. Skin: Skin is warm and dry. No rash noted.  Psychiatric: He has a normal mood and affect. His behavior is normal. Judgment and thought content normal.    Results for orders placed or performed in visit on 07/14/18  Microalbumin, Urine Waived (STAT)  Result Value Ref Range   Microalb, Ur Waived 10 0 - 19 mg/L   Creatinine, Urine Waived 50 10 - 300 mg/dL   Microalb/Creat Ratio <30 <30 mg/g      Assessment & Plan:   Problem List Items Addressed This Visit      Cardiovascular and Mediastinum   Benign essential HTN    Chronic, stable.  Continue current medications.  CMP today.        Respiratory  Obstructive sleep apnea syndrome    Sleep study ordered for [facility].        Musculoskeletal and Integument   Psoriasis    Chronic, stable.  Continue current cream.        Other   Hyperlipemia    Chronic, stable.  Lipid panel today and adjust medications as needed.      Severe obesity (BMI >= 40) (HCC)    Continue to focus on diet and exercise.      Depression    Chronic, stable.  Continue medication regimen.       Other Visit Diagnoses    Annual physical exam    -  Primary   Relevant Orders   CBC w/Diff   Comp Met (CMET)   TSH   HgB A1c   Lipid Profile   Microalbumin, Urine Waived (STAT) (Completed)   Ambulatory referral to Sleep Studies   Flu vaccine need       Relevant Orders   Flu Vaccine QUAD 36+ mos IM (Completed)       Follow up plan: Return in about 6 months (around 01/13/2019) for HTN and HLD.

## 2018-07-14 NOTE — Assessment & Plan Note (Signed)
Chronic, stable.  Continue current cream.

## 2018-07-15 ENCOUNTER — Telehealth: Payer: Self-pay | Admitting: Nurse Practitioner

## 2018-07-15 DIAGNOSIS — E1159 Type 2 diabetes mellitus with other circulatory complications: Secondary | ICD-10-CM | POA: Insufficient documentation

## 2018-07-15 DIAGNOSIS — E119 Type 2 diabetes mellitus without complications: Secondary | ICD-10-CM

## 2018-07-15 LAB — CBC WITH DIFFERENTIAL/PLATELET
BASOS: 1 %
Basophils Absolute: 0.1 10*3/uL (ref 0.0–0.2)
EOS (ABSOLUTE): 0.2 10*3/uL (ref 0.0–0.4)
EOS: 3 %
HEMATOCRIT: 43.5 % (ref 37.5–51.0)
Hemoglobin: 14.5 g/dL (ref 13.0–17.7)
IMMATURE GRANS (ABS): 0 10*3/uL (ref 0.0–0.1)
IMMATURE GRANULOCYTES: 0 %
Lymphocytes Absolute: 3.8 10*3/uL — ABNORMAL HIGH (ref 0.7–3.1)
Lymphs: 45 %
MCH: 28.2 pg (ref 26.6–33.0)
MCHC: 33.3 g/dL (ref 31.5–35.7)
MCV: 85 fL (ref 79–97)
MONOS ABS: 0.7 10*3/uL (ref 0.1–0.9)
Monocytes: 9 %
NEUTROS ABS: 3.4 10*3/uL (ref 1.4–7.0)
NEUTROS PCT: 42 %
Platelets: 279 10*3/uL (ref 150–450)
RBC: 5.14 x10E6/uL (ref 4.14–5.80)
RDW: 13.7 % (ref 12.3–15.4)
WBC: 8.2 10*3/uL (ref 3.4–10.8)

## 2018-07-15 LAB — COMPREHENSIVE METABOLIC PANEL
ALK PHOS: 92 IU/L (ref 39–117)
ALT: 23 IU/L (ref 0–44)
AST: 21 IU/L (ref 0–40)
Albumin/Globulin Ratio: 1.5 (ref 1.2–2.2)
Albumin: 4.4 g/dL (ref 3.5–5.5)
BUN / CREAT RATIO: 7 — AB (ref 9–20)
BUN: 8 mg/dL (ref 6–24)
Bilirubin Total: 0.3 mg/dL (ref 0.0–1.2)
CO2: 25 mmol/L (ref 20–29)
CREATININE: 1.11 mg/dL (ref 0.76–1.27)
Calcium: 9.4 mg/dL (ref 8.7–10.2)
Chloride: 97 mmol/L (ref 96–106)
GFR calc Af Amer: 90 mL/min/{1.73_m2} (ref >59)
GFR calc non Af Amer: 78 mL/min/{1.73_m2} (ref >59)
GLOBULIN, TOTAL: 2.9 g/dL (ref 1.5–4.5)
Glucose: 121 mg/dL — ABNORMAL HIGH (ref 65–99)
Potassium: 4 mmol/L (ref 3.5–5.2)
SODIUM: 135 mmol/L (ref 134–144)
TOTAL PROTEIN: 7.3 g/dL (ref 6.0–8.5)

## 2018-07-15 LAB — LIPID PANEL
CHOL/HDL RATIO: 4.3 ratio (ref 0.0–5.0)
Cholesterol, Total: 128 mg/dL (ref 100–199)
HDL: 30 mg/dL — AB (ref >39)
LDL Calculated: 70 mg/dL (ref 0–99)
TRIGLYCERIDES: 138 mg/dL (ref 0–149)
VLDL CHOLESTEROL CAL: 28 mg/dL (ref 5–40)

## 2018-07-15 LAB — HEMOGLOBIN A1C
Est. average glucose Bld gHb Est-mCnc: 163 mg/dL
Hgb A1c MFr Bld: 7.3 % — ABNORMAL HIGH (ref 4.8–5.6)

## 2018-07-15 LAB — TSH: TSH: 0.595 u[IU]/mL (ref 0.450–4.500)

## 2018-07-15 MED ORDER — ONDANSETRON 4 MG PO TBDP: ORAL_TABLET | ORAL | 2 refills | Status: DC

## 2018-07-15 MED ORDER — METFORMIN HCL 500 MG PO TABS: 500.0000 mg | ORAL_TABLET | Freq: Two times a day (BID) | ORAL | 3 refills | Status: DC

## 2018-07-15 NOTE — Assessment & Plan Note (Signed)
A1C 7.3%, new diagnosis as of 07/15/18.  Metformin 500 MG BID and referral to diabetic education placed.  Follow-up in 3 months.

## 2018-07-15 NOTE — Telephone Encounter (Signed)
Spoke to Lance Kim via telephone to update him on recent blood work.  A1C 7.3%, on review he has tended to trend higher on glucose on CMP intermittently.  No recent A1C noted.  He reports he was told in past he was prediabetic.  Discussed that A1C of 7.3% is considered T2DM.  He agrees to initiating Metformin and reviewed GI side effects with him.  He also agrees to referral to diabetic education to learn about diet.  He reports he has been drinking diet soda for awhile, but then over past year went back to regular soda.  Recommended he return to diet sodas and focus on diet with low sweets/carbs.  Will follow-up in three months with provider and at that time will discuss checking FSBS and educate on routine.  Metformin 500 MG twice a day and referral to education ordered.

## 2018-08-09 ENCOUNTER — Encounter: Payer: Self-pay | Admitting: Dietician

## 2018-08-09 ENCOUNTER — Encounter: Payer: 59 | Attending: Nurse Practitioner | Admitting: Dietician

## 2018-08-09 VITALS — BP 120/86 | Ht 65.0 in | Wt 255.4 lb

## 2018-08-09 DIAGNOSIS — E119 Type 2 diabetes mellitus without complications: Secondary | ICD-10-CM | POA: Insufficient documentation

## 2018-08-09 NOTE — Progress Notes (Signed)
Diabetes Self-Management Education  Visit Type: First/Initial  Appt. Start Time: 1610 Appt. End Time: 1610  08/09/2018  [name], identified by name and date of birth, is a 47 y.o. male with a diagnosis of Diabetes: Type 2.   ASSESSMENT  Blood pressure 120/86, height 5\' 5"  (1.651 m), weight 255 lb 6.4 oz (115.8 kg). Body mass index is 42.5 kg/m.  Diabetes Self-Management Education - 08/09/18 1725      Visit Information   Visit Type  First/Initial      Initial Visit   Diabetes Type  Type 2      Health Coping   How would you rate your overall health?  Fair      Psychosocial Assessment   Patient Belief/Attitude about Diabetes  Motivated to manage diabetes    Self-care barriers  None    Self-management support  Doctor's office;Family    Other persons present  Patient    Patient Concerns  Medication;Monitoring;Healthy Lifestyle;Glycemic Control;Weight Control   prevent complications, become more fit   Special Needs  None    Preferred Learning Style  Hands on    Learning Readiness  Ready      Pre-Education Assessment   Patient understands the diabetes disease and treatment process.  Needs Instruction    Patient understands incorporating nutritional management into lifestyle.  Needs Instruction    Patient undertands incorporating physical activity into lifestyle.  Needs Instruction    Patient understands using medications safely.  Needs Instruction    Patient understands monitoring blood glucose, interpreting and using results  Needs Instruction    Patient understands prevention, detection, and treatment of acute complications.  Needs Instruction    Patient understands prevention, detection, and treatment of chronic complications.  Needs Instruction    Patient understands how to develop strategies to address psychosocial issues.  Needs Instruction    Patient understands how to develop strategies to promote health/change behavior.  Needs Instruction      Complications   Last HgB A1C per patient/outside source  7.3 %   07-14-18   How often do you check your blood sugar?  0 times/day (not testing)    Have you had a dilated eye exam in the past 12 months?  Yes   12-2017   Have you had a dental exam in the past 12 months?  Yes   05-2018   Are you checking your feet?  No      Dietary Intake   Breakfast  eats breakfast at 10a (oatmeal and fruit)    Snack (morning)  none    Lunch  eats lunch at 12p-meat/cheese sandwich or salad    Snack (afternoon)  eats crackers at [employer]  eats supper at 7:30p-meat and vegetables    Snack (evening)  none    Beverage(s)  drinks water 8+x/day, occasional whole milk x1 and vegetable juice 0-1x/day      Exercise   Exercise Type  Light (walking / raking leaves)    How many days per week to you exercise?  5    How many minutes per day do you exercise?  30    Total minutes per week of exercise  150      Patient Education   Previous Diabetes Education  No    Disease state   Explored patient's options for treatment of their diabetes;Definition of diabetes, type 1 and 2, and the diagnosis of diabetes    Nutrition management   Role of diet in the  treatment of diabetes and the relationship between the three main macronutrients and blood glucose level;Food label reading, portion sizes and measuring food.;Carbohydrate counting    Physical activity and exercise   Role of exercise on diabetes management, blood pressure control and cardiac health.;Helped patient identify appropriate exercises in relation to his/her diabetes, diabetes complications and other health issue.    Medications  Reviewed patients medication for diabetes, action, purpose, timing of dose and side effects.    Monitoring  Taught/evaluated SMBG meter.;Taught/discussed recording of test results and interpretation of SMBG.;Identified appropriate SMBG and/or A1C goals.;Yearly dilated eye exam;Purpose and frequency of SMBG.   gave pt One Touch Verio  Flex meter and instructed on its use-BG 71   Chronic complications  Relationship between chronic complications and blood glucose control;Retinopathy and reason for yearly dilated eye exams;Dental care;Lipid levels, blood glucose control and heart disease    Personal strategies to promote health  Lifestyle issues that need to be addressed for better diabetes care;Helped patient develop diabetes management plan for (enter comment)      Outcomes   Expected Outcomes  Demonstrated interest in learning. Expect positive outcomes       Individualized Plan for Diabetes Self-Management Training:   Learning Objective:  Patient will have a greater understanding of diabetes self-management. Patient education plan is to attend individual and/or group sessions per assessed needs and concerns.   Plan:   Patient Instructions   Check blood sugars 2 x day before breakfast and 2 hrs after supper every day and record   Bring blood sugar records to the next appointment/class  Call your doctor for a prescription for:  1. Meter strips (type) One Touch Verio test strips  checking  2 times per day  2. Lancets (type)  One Touch Delica lancets checking  2  times per day  Exercise:  continue to walk 15 min twice/day 5 days/wk  Eat 3 meals day and 1 snack a day at bedtime if eating early supper or afternoon if eating late supper  Space meals 4-5 hours apart  Eat 3-4 carbohydrate servings/meal + protein  Eat 1 carbohydrate serving/snack + protein  Limit intake of sweets and fried fodos  Avoid sugar sweetened drinks (soda, tea, coffee, sports drinks, juices)  Continue to drink plenty of water  Get a Radiation protection practitioner  Quit smoking-consider free classes for smoking cessation  Eat snack or supper ASAP  Take Metformin with breakfast and supper  Return for appointment/classes on: 08-16-18   Expected Outcomes:  Demonstrated interest in learning. Expect positive outcomes  Education material provided:  General meal planning guidelines, Food Group handout, One Touch Verio Flex meter  If problems or questions, patient to contact team via:  [phone]  Future DSME appointment:   08-16-18

## 2018-08-09 NOTE — Patient Instructions (Addendum)
  Check blood sugars 2 x day before breakfast and 2 hrs after supper every day and record   Bring blood sugar records to the next appointment/class  Call your doctor for a prescription for:  1. Meter strips (type) One Touch Verio test strips  checking  2 times per day  2. Lancets (type)  One Touch Delica lancets checking  2  times per day  Exercise:  continue to walk 15 min twice/day 5 days/wk  Eat 3 meals day and 1 snack a day at bedtime if eating early supper or afternoon if eating late supper  Space meals 4-5 hours apart  Eat 3-4 carbohydrate servings/meal + protein  Eat 1 carbohydrate serving/snack + protein  Limit intake of sweets and fried foods  Avoid sugar sweetened drinks (soda, tea, coffee, sports drinks, juices)  Continue to drink plenty of water  Get a Radiation protection practitioner  Quit smoking-consider free classes for smoking cessation  Eat snack or supper ASAP  Tale Metformin with breakfast and supper  Return for appointment/classes on: 08-16-18

## 2018-08-10 ENCOUNTER — Telehealth: Payer: Self-pay | Admitting: Nurse Practitioner

## 2018-08-10 ENCOUNTER — Other Ambulatory Visit: Payer: Self-pay | Admitting: Nurse Practitioner

## 2018-08-10 MED ORDER — ONETOUCH ULTRASOFT LANCETS MISC: 12 refills | Status: DC

## 2018-08-10 MED ORDER — GLUCOSE BLOOD VI STRP: ORAL_STRIP | 12 refills | Status: DC

## 2018-08-10 NOTE — Progress Notes (Signed)
Patient received glucometer from diabetic education class and is requesting order for test strips and lancets.  Orders sent to pharmacy.  Will notify patient.

## 2018-08-10 NOTE — Telephone Encounter (Signed)
Copied from [place] [phone]. Topic: Quick Communication - Rx Refill/Question >> Aug 10, 2018  3:45 PM Rayann Heman wrote: Medication:pt called and stated that he went to his nutrition appointment 08/09/18. Nutritionist gave him a one touch verio  meter. Pt states that he needs lancets and text strips for meter. Please advise. Pt states that nutritionist told him to check glucose 2 times a day [place], [place] [phone] (Phone) [phone] (Fax)

## 2018-08-10 NOTE — Telephone Encounter (Signed)
They are sent to pharmacy.  Tell patient I am super proud of him for going to classes though and giving him a virtual high five.

## 2018-08-11 ENCOUNTER — Telehealth: Payer: Self-pay | Admitting: *Deleted

## 2018-08-11 NOTE — Telephone Encounter (Signed)
Received voicemail from patient. He called back and reported that he changed his diet and was having some problems. He eats breakfast at 7:30 am and then walks on his break at 10. He reports feeling weak after his walk. This also occurs after lunch and his afternoon break when he walks. He hasn't checked his blood sugar to see if this is hypoglycemia. Discussed walking an hour after a meal but he is not able to do this at work. He will eat a snack with 1 protein and 1 carb serving at break before walking and check blood sugar if he still has symptoms. Instructed to treat hypoglycemia symptoms with fast acting glucose. He is only taking Metformin bid. He reports FBG's of 78-97 mg/dL and post meal last night was 89 mg/dL. Discussed eating 1 serving of protein and 3 servings of carbs with meals. He returns on Mon for Diabetes Class 1.

## 2018-08-16 ENCOUNTER — Encounter: Payer: Self-pay | Admitting: Dietician

## 2018-08-16 ENCOUNTER — Encounter: Payer: 59 | Attending: Nurse Practitioner | Admitting: Dietician

## 2018-08-16 VITALS — Wt 257.3 lb

## 2018-08-16 DIAGNOSIS — E119 Type 2 diabetes mellitus without complications: Secondary | ICD-10-CM | POA: Diagnosis not present

## 2018-08-16 NOTE — Progress Notes (Signed)
Appt. Start Time: 5:30pm Appt. End Time: 8:30pm  Class 1 Diabetes Overview - define DM; state own type of DM; identify functions of pancreas and insulin; define insulin deficiency vs insulin resistance  Psychosocial - identify DM as a source of stress; state the effects of stress on BG control  Nutritional Management - describe effects of food on blood glucose; identify sources of carbohydrate, protein and fat; verbalize the importance of balance meals in controlling blood glucose  Exercise - describe the effects of exercise on blood glucose and importance of regular exercise in controlling diabetes; state a plan for personal exercise; verbalize contraindications for exercise  Self-Monitoring - state importance of SMBG; use SMBG results to effectively manage diabetes; identify importance of regular HbA1C testing and goals for results  Acute Complications - recognize hyperglycemia and hypoglycemia with causes and effects; identify blood glucose results as high, low or in control; list steps in treating and preventing high and low blood glucose  Chronic Complications/Foot, Skin, Eye Dental Care - identify possible long-term complications of diabetes (retinopathy, neuropathy, nephropathy, cardiovascular disease, infections); state importance of daily self-foot exams; describe how to examine feet and what to look for; explain appropriate eye and dental care  Lifestyle Changes/Goals & Health/Community Resources - state benefits of making appropriate lifestyle changes; identify habits that need to change (meals, tobacco, alcohol); identify strategies to reduce risk factors for personal health  Pregnancy/Sexual Health - define gestational diabetes; state importance of good blood glucose control and birth control prior to pregnancy; state importance of good blood glucose control in preventing sexual problems (impotence, vaginal dryness, infections, loss of desire); state relationship of blood glucose  control and pregnancy outcome; describe risk of maternal and fetal complications  Teaching Materials Used: Class 1 Slides/Notebook Diabetes Booklet ID Card  Medic Alert/Medic ID Forms Sleep Evaluation Exercise Handout Planning a Balanced Meal Goals for Class 1

## 2018-08-19 ENCOUNTER — Ambulatory Visit (INDEPENDENT_AMBULATORY_CARE_PROVIDER_SITE_OTHER): Payer: 59 | Admitting: Nurse Practitioner

## 2018-08-19 ENCOUNTER — Encounter: Payer: Self-pay | Admitting: Nurse Practitioner

## 2018-08-19 DIAGNOSIS — E119 Type 2 diabetes mellitus without complications: Secondary | ICD-10-CM | POA: Diagnosis not present

## 2018-08-19 MED ORDER — TRIAMCINOLONE 0.1 % CREAM:EUCERIN CREAM 1:1: 1.0000 "application " | TOPICAL_CREAM | Freq: Every day | CUTANEOUS | 3 refills | Status: DC

## 2018-08-19 MED ORDER — GLUCOSE BLOOD VI STRP: ORAL_STRIP | 12 refills | Status: DC

## 2018-08-19 NOTE — Patient Instructions (Signed)
Diabetes Mellitus and Nutrition When you have diabetes (diabetes mellitus), it is very important to have healthy eating habits because your blood sugar (glucose) levels are greatly affected by what you eat and drink. Eating healthy foods in the appropriate amounts, at about the same times every day, can help you:  Control your blood glucose.  Lower your risk of heart disease.  Improve your blood pressure.  Reach or maintain a healthy weight.  Every person with diabetes is different, and each person has different needs for a meal plan. Your health care provider may recommend that you work with a diet and nutrition specialist (dietitian) to make a meal plan that is best for you. Your meal plan may vary depending on factors such as:  The calories you need.  The medicines you take.  Your weight.  Your blood glucose, blood pressure, and cholesterol levels.  Your activity level.  Other health conditions you have, such as heart or kidney disease.  How do carbohydrates affect me? Carbohydrates affect your blood glucose level more than any other type of food. Eating carbohydrates naturally increases the amount of glucose in your blood. Carbohydrate counting is a method for keeping track of how many carbohydrates you eat. Counting carbohydrates is important to keep your blood glucose at a healthy level, especially if you use insulin or take certain oral diabetes medicines. It is important to know how many carbohydrates you can safely have in each meal. This is different for every person. Your dietitian can help you calculate how many carbohydrates you should have at each meal and for snack. Foods that contain carbohydrates include:  Bread, cereal, rice, pasta, and crackers.  Potatoes and corn.  Peas, beans, and lentils.  Milk and yogurt.  Fruit and juice.  Desserts, such as cakes, cookies, ice cream, and candy.  How does alcohol affect me? Alcohol can cause a sudden decrease in blood  glucose (hypoglycemia), especially if you use insulin or take certain oral diabetes medicines. Hypoglycemia can be a life-threatening condition. Symptoms of hypoglycemia (sleepiness, dizziness, and confusion) are similar to symptoms of having too much alcohol. If your health care provider says that alcohol is safe for you, follow these guidelines:  Limit alcohol intake to no more than 1 drink per day for nonpregnant women and 2 drinks per day for men. One drink equals 12 oz of beer, 5 oz of wine, or 1 oz of hard liquor.  Do not drink on an empty stomach.  Keep yourself hydrated with water, diet soda, or unsweetened iced tea.  Keep in mind that regular soda, juice, and other mixers may contain a lot of sugar and must be counted as carbohydrates.  What are tips for following this plan? Reading food labels  Start by checking the serving size on the label. The amount of calories, carbohydrates, fats, and other nutrients listed on the label are based on one serving of the food. Many foods contain more than one serving per package.  Check the total grams (g) of carbohydrates in one serving. You can calculate the number of servings of carbohydrates in one serving by dividing the total carbohydrates by 15. For example, if a food has 30 g of total carbohydrates, it would be equal to 2 servings of carbohydrates.  Check the number of grams (g) of saturated and trans fats in one serving. Choose foods that have low or no amount of these fats.  Check the number of milligrams (mg) of sodium in one serving. Most people   should limit total sodium intake to less than 2,300 mg per day.  Always check the nutrition information of foods labeled as "low-fat" or "nonfat". These foods may be higher in added sugar or refined carbohydrates and should be avoided.  Talk to your dietitian to identify your daily goals for nutrients listed on the label. Shopping  Avoid buying canned, premade, or processed foods. These  foods tend to be high in fat, sodium, and added sugar.  Shop around the outside edge of the grocery store. This includes fresh fruits and vegetables, bulk grains, fresh meats, and fresh dairy. Cooking  Use low-heat cooking methods, such as baking, instead of high-heat cooking methods like deep frying.  Cook using healthy oils, such as olive, canola, or sunflower oil.  Avoid cooking with butter, cream, or high-fat meats. Meal planning  Eat meals and snacks regularly, preferably at the same times every day. Avoid going long periods of time without eating.  Eat foods high in fiber, such as fresh fruits, vegetables, beans, and whole grains. Talk to your dietitian about how many servings of carbohydrates you can eat at each meal.  Eat 4-6 ounces of lean protein each day, such as lean meat, chicken, fish, eggs, or tofu. 1 ounce is equal to 1 ounce of meat, chicken, or fish, 1 egg, or 1/4 cup of tofu.  Eat some foods each day that contain healthy fats, such as avocado, nuts, seeds, and fish. Lifestyle   Check your blood glucose regularly.  Exercise at least 30 minutes 5 or more days each week, or as told by your health care provider.  Take medicines as told by your health care provider.  Do not use any products that contain nicotine or tobacco, such as cigarettes and e-cigarettes. If you need help quitting, ask your health care provider.  Work with a counselor or diabetes educator to identify strategies to manage stress and any emotional and social challenges. What are some questions to ask my health care provider?  Do I need to meet with a diabetes educator?  Do I need to meet with a dietitian?  What number can I call if I have questions?  When are the best times to check my blood glucose? Where to find more information:  American Diabetes Association: diabetes.org/food-and-fitness/food  Academy of Nutrition and Dietetics:  www.eatright.org/resources/health/diseases-and-conditions/diabetes  National Institute of Diabetes and Digestive and Kidney Diseases (NIH): www.niddk.nih.gov/health-information/diabetes/overview/diet-eating-physical-activity Summary  A healthy meal plan will help you control your blood glucose and maintain a healthy lifestyle.  Working with a diet and nutrition specialist (dietitian) can help you make a meal plan that is best for you.  Keep in mind that carbohydrates and alcohol have immediate effects on your blood glucose levels. It is important to count carbohydrates and to use alcohol carefully. This information is not intended to replace advice given to you by your health care provider. Make sure you discuss any questions you have with your health care provider. Document Released: 05/29/2005 Document Revised: 10/06/2016 Document Reviewed: 10/06/2016 Elsevier Interactive Patient Education  2018 [employer] Inc.  

## 2018-08-19 NOTE — Progress Notes (Signed)
BP 121/82   Pulse 70   Temp 98.7 F (37.1 C) (Oral)   Ht 5' 2.5" (1.588 m)   Wt 254 lb 3.2 oz (115.3 kg)   SpO2 99%   BMI 45.75 kg/m    Subjective:    Patient ID: Lance Kim, male    DOB: [birth year], 47 y.o.   MRN: [id number]  HPI: Lance Kim is a 47 y.o. male presents for diabetes medication discussion  Chief Complaint  Patient presents with  . Medication Problem    pt states the metformin has been upsetting his stomach    DIABETES A1C one month ago 7.3 and was started on Metformin.  Currently his work schedule is 8 to 4:30.  Currently he is taking Metformin 500 MG twice a day.  He reports the morning dose is causing nausea and headache, has tried taking it with food and all other options.  Evening dose does not cause any issues.  He is attending diabetic education and has been very compliant with regimen and education provided. Hypoglycemic episodes:no Polydipsia/polyuria: no Visual disturbance: no Chest pain: no Paresthesias: no Glucose Monitoring: yes  Accucheck frequency: BID  Fasting glucose: 72 to 107  Post prandial:  Evening: 89 to 177  Before meals: Taking Insulin?: no  Long acting insulin:  Short acting insulin: Blood Pressure Monitoring: not checking Retinal Examination: Up to Date Foot Exam: Not up to Date Diabetic Education: Completed Aspirin: yes  Relevant past medical, surgical, family and social history reviewed and updated as indicated. Interim medical history since our last visit reviewed. Allergies and medications reviewed and updated.  Review of Systems  Constitutional: Negative for activity change, diaphoresis, fatigue and fever.  Respiratory: Negative for cough, chest tightness, shortness of breath and wheezing.   Cardiovascular: Negative for chest pain, palpitations and leg swelling.  Gastrointestinal: Negative for abdominal distention, abdominal pain, constipation, diarrhea, nausea and vomiting.  Endocrine: Negative for cold  intolerance, heat intolerance, polydipsia, polyphagia and polyuria.  Musculoskeletal: Negative.   Skin: Negative.   Neurological: Negative for dizziness, syncope, weakness, light-headedness, numbness and headaches.  Psychiatric/Behavioral: Negative.     Per HPI unless specifically indicated above     Objective:    BP 121/82   Pulse 70   Temp 98.7 F (37.1 C) (Oral)   Ht 5' 2.5" (1.588 m)   Wt 254 lb 3.2 oz (115.3 kg)   SpO2 99%   BMI 45.75 kg/m   Wt Readings from Last 3 Encounters:  08/19/18 254 lb 3.2 oz (115.3 kg)  08/16/18 257 lb 4.8 oz (116.7 kg)  08/09/18 255 lb 6.4 oz (115.8 kg)    Physical Exam  Constitutional: He is oriented to person, place, and time. He appears well-developed and well-nourished.  HENT:  Head: Normocephalic and atraumatic.  Right Ear: Hearing normal. No drainage.  Left Ear: Hearing normal. No drainage.  Mouth/Throat: Uvula is midline and mucous membranes are normal.  Eyes: Pupils are equal, round, and reactive to light. Conjunctivae, EOM and lids are normal. Right eye exhibits no discharge. Left eye exhibits no discharge.  Neck: Trachea normal and normal range of motion. Neck supple. No JVD present. Carotid bruit is not present. No thyromegaly present.  Cardiovascular: Normal rate, regular rhythm, S1 normal, S2 normal and normal heart sounds. Exam reveals no gallop.  No murmur heard. Pulmonary/Chest: Effort normal and breath sounds normal.  Abdominal: Soft. Bowel sounds are normal. There is no splenomegaly or hepatomegaly.  Genitourinary: Rectum normal and penis normal.  Musculoskeletal: Normal  range of motion.  Neurological: He is alert and oriented to person, place, and time. He has normal reflexes.  Skin: Skin is warm, dry and intact. Capillary refill takes less than 2 seconds. No rash noted.  Psychiatric: He has a normal mood and affect. His behavior is normal. Judgment and thought content normal.  Nursing note and vitals  reviewed.   Results for orders placed or performed in visit on 07/14/18  CBC w/Diff  Result Value Ref Range   WBC 8.2 3.4 - 10.8 x10E3/uL   RBC 5.14 4.14 - 5.80 x10E6/uL   Hemoglobin 14.5 13.0 - 17.7 g/dL   Hematocrit 43.5 37.5 - 51.0 %   MCV 85 79 - 97 fL   MCH 28.2 26.6 - 33.0 pg   MCHC 33.3 31.5 - 35.7 g/dL   RDW 13.7 12.3 - 15.4 %   Platelets 279 150 - 450 x10E3/uL   Neutrophils 42 Not Estab. %   Lymphs 45 Not Estab. %   Monocytes 9 Not Estab. %   Eos 3 Not Estab. %   Basos 1 Not Estab. %   Neutrophils Absolute 3.4 1.4 - 7.0 x10E3/uL   Lymphocytes Absolute 3.8 (H) 0.7 - 3.1 x10E3/uL   Monocytes Absolute 0.7 0.1 - 0.9 x10E3/uL   EOS (ABSOLUTE) 0.2 0.0 - 0.4 x10E3/uL   Basophils Absolute 0.1 0.0 - 0.2 x10E3/uL   Immature Granulocytes 0 Not Estab. %   Immature Grans (Abs) 0.0 0.0 - 0.1 x10E3/uL  Comp Met (CMET)  Result Value Ref Range   Glucose 121 (H) 65 - 99 mg/dL   BUN 8 6 - 24 mg/dL   Creatinine, Ser 1.11 0.76 - 1.27 mg/dL   GFR calc non Af Amer 78 >59 mL/min/1.73   GFR calc Af Amer 90 >59 mL/min/1.73   BUN/Creatinine Ratio 7 (L) 9 - 20   Sodium 135 134 - 144 mmol/L   Potassium 4.0 3.5 - 5.2 mmol/L   Chloride 97 96 - 106 mmol/L   CO2 25 20 - 29 mmol/L   Calcium 9.4 8.7 - 10.2 mg/dL   Total Protein 7.3 6.0 - 8.5 g/dL   Albumin 4.4 3.5 - 5.5 g/dL   Globulin, Total 2.9 1.5 - 4.5 g/dL   Albumin/Globulin Ratio 1.5 1.2 - 2.2   Bilirubin Total 0.3 0.0 - 1.2 mg/dL   Alkaline Phosphatase 92 39 - 117 IU/L   AST 21 0 - 40 IU/L   ALT 23 0 - 44 IU/L  TSH  Result Value Ref Range   TSH 0.595 0.450 - 4.500 uIU/mL  HgB A1c  Result Value Ref Range   Hgb A1c MFr Bld 7.3 (H) 4.8 - 5.6 %   Est. average glucose Bld gHb Est-mCnc 163 mg/dL  Lipid Profile  Result Value Ref Range   Cholesterol, Total 128 100 - 199 mg/dL   Triglycerides 138 0 - 149 mg/dL   HDL 30 (L) >39 mg/dL   VLDL Cholesterol Cal 28 5 - 40 mg/dL   LDL Calculated 70 0 - 99 mg/dL   Chol/HDL Ratio 4.3 0.0 -  5.0 ratio  Microalbumin, Urine Waived (STAT)  Result Value Ref Range   Microalb, Ur Waived 10 0 - 19 mg/L   Creatinine, Urine Waived 50 10 - 300 mg/dL   Microalb/Creat Ratio <30 <30 mg/g      Assessment & Plan:   Problem List Items Addressed This Visit      Endocrine   Type 2 diabetes mellitus without complications ([place])  Has been following diabetic education regimen with good BS readings.  Will have him reduce Metformin to 500 MG in the evening at this time.  A1C at January visit.          Follow up plan: Return if symptoms worsen or fail to improve.

## 2018-08-19 NOTE — Assessment & Plan Note (Signed)
Has been following diabetic education regimen with good BS readings.  Will have him reduce Metformin to 500 MG in the evening at this time.  A1C at January visit.

## 2018-08-22 ENCOUNTER — Other Ambulatory Visit: Payer: Self-pay | Admitting: Nurse Practitioner

## 2018-08-23 ENCOUNTER — Telehealth: Payer: Self-pay

## 2018-08-23 ENCOUNTER — Encounter: Payer: Self-pay | Admitting: Dietician

## 2018-08-23 ENCOUNTER — Encounter: Payer: 59 | Admitting: Dietician

## 2018-08-23 VITALS — Wt 255.1 lb

## 2018-08-23 DIAGNOSIS — E119 Type 2 diabetes mellitus without complications: Secondary | ICD-10-CM

## 2018-08-23 NOTE — Telephone Encounter (Signed)
PA approved.

## 2018-08-23 NOTE — Progress Notes (Signed)

## 2018-08-23 NOTE — Telephone Encounter (Signed)
Requested medication (s) are due for refill today: yes  Requested medication (s) are on the active medication list: yes    Last refill: 07/15/18  #20  2 refills  Future visit scheduled yes  10/15/2018 J. Cannady  Notes to clinic:not delegated  Requested Prescriptions  Pending Prescriptions Disp Refills   ondansetron (ZOFRAN-ODT) 4 MG disintegrating tablet [Pharmacy Med Name: ONDANSETRON ODT 4MG  TABLETS] 20 tablet 0    Sig: DISSOLVE 1 TABLET ON THE TONGUE EVERY 8 HOURS AS NEEDED FOR NAUSEA     Not Delegated - Gastroenterology: Antiemetics Failed - 08/22/2018  7:13 AM      Failed - This refill cannot be delegated      Passed - Valid encounter within last 6 months    Recent Outpatient Visits          4 days ago Type 2 diabetes mellitus without complication, without long-term current use of insulin ([place])   [place], Barbaraann Faster, NP   1 month ago Annual physical exam   [place], [place] T, NP   4 months ago Hyperlipidemia, unspecified hyperlipidemia type   [facility] Kathrine Haddock, NP   7 months ago Non-seasonal allergic rhinitis, unspecified trigger   [facility] Merrie Roof [place], [place]   10 months ago Lip fissure   [facility] Kathrine Haddock, NP      Future Appointments            In 1 month Cannady, Barbaraann Faster, NP [facility], [place]   In 4 months [place], Barbaraann Faster, NP [facility], [facility]

## 2018-08-23 NOTE — Telephone Encounter (Signed)
PA initiated via Cover My Meds for OneTouch Verio test strips.

## 2018-08-24 NOTE — Telephone Encounter (Signed)
Patient states that the pharmacy would only give him 50 strips at a time and he said that he is checking it twice a day. He said that he will be out before the end of the month and wanted to know if the script could be written different so that insurance would pay for it. The nutrition center told him that when he exercises he needs to keep a close eye on it so he has been checking it more. Please Advise.

## 2018-08-24 NOTE — Telephone Encounter (Signed)
Called and spoke with Pharmacy. They stated they do not know if the P.A. Will allow him to pick up the 100 strips at a time. Stated that when they tried to run it currently his insurance is just saying it is too early to fill at the moment. Relayed message to patient. Verbalized understanding and denied questions.

## 2018-08-28 ENCOUNTER — Other Ambulatory Visit: Payer: Self-pay | Admitting: Nurse Practitioner

## 2018-08-30 ENCOUNTER — Encounter: Payer: Self-pay | Admitting: Dietician

## 2018-08-30 ENCOUNTER — Encounter: Payer: 59 | Admitting: Dietician

## 2018-08-30 VITALS — BP 120/78 | Ht 65.0 in | Wt 253.8 lb

## 2018-08-30 DIAGNOSIS — E119 Type 2 diabetes mellitus without complications: Secondary | ICD-10-CM | POA: Diagnosis not present

## 2018-08-30 NOTE — Telephone Encounter (Signed)
Requested medication (s) are due for refill today: Yes  Requested medication (s) are on the active medication list: Yes  Last refill:  08/24/18  Future visit scheduled: Yes  Notes to clinic:  See request    Requested Prescriptions  Pending Prescriptions Disp Refills   ondansetron (ZOFRAN-ODT) 4 MG disintegrating tablet [Pharmacy Med Name: ONDANSETRON ODT 4MG  TABLETS] 20 tablet 0    Sig: DISSOLVE 1 TABLET ON THE TONGUE EVERY 8 HOURS AS NEEDED FOR NAUSEA     Not Delegated - Gastroenterology: Antiemetics Failed - 08/28/2018  3:11 AM      Failed - This refill cannot be delegated      Passed - Valid encounter within last 6 months    Recent Outpatient Visits          1 week ago Type 2 diabetes mellitus without complication, without long-term current use of insulin ([place])   [place], Barbaraann Faster, NP   1 month ago Annual physical exam   [place], [place] T, NP   4 months ago Hyperlipidemia, unspecified hyperlipidemia type   [facility] Kathrine Haddock, NP   8 months ago Non-seasonal allergic rhinitis, unspecified trigger   [facility] Merrie Roof [place], [place]   10 months ago Lip fissure   [facility] Kathrine Haddock, NP      Future Appointments            In 1 month Cannady, Barbaraann Faster, NP [facility], [place]   In 4 months [place], Barbaraann Faster, NP [facility], [facility]

## 2018-08-30 NOTE — Progress Notes (Signed)

## 2018-09-03 ENCOUNTER — Other Ambulatory Visit: Payer: Self-pay | Admitting: *Deleted

## 2018-09-03 ENCOUNTER — Other Ambulatory Visit: Payer: Self-pay | Admitting: Unknown Physician Specialty

## 2018-09-03 MED ORDER — AMLODIPINE BESYLATE 10 MG PO TABS: ORAL_TABLET | ORAL | 0 refills | Status: DC

## 2018-09-03 MED ORDER — LISINOPRIL-HYDROCHLOROTHIAZIDE 20-12.5 MG PO TABS: 1.0000 | ORAL_TABLET | Freq: Every day | ORAL | 0 refills | Status: DC

## 2018-09-03 NOTE — Progress Notes (Signed)
Requested Prescriptions  Pending Prescriptions Disp Refills  . lisinopril-hydrochlorothiazide (PRINZIDE,ZESTORETIC) 20-12.5 MG tablet 90 tablet 0    Sig: Take 1 tablet by mouth daily.     There is no refill protocol information for this order    . amLODipine (NORVASC) 10 MG tablet 90 tablet 0    Sig: TAKE 1 TABLET(10 MG) BY MOUTH DAILY     There is no refill protocol information for this order

## 2018-09-06 ENCOUNTER — Encounter: Payer: Self-pay | Admitting: Dietician

## 2018-09-16 ENCOUNTER — Other Ambulatory Visit: Payer: Self-pay | Admitting: Nurse Practitioner

## 2018-09-17 NOTE — Telephone Encounter (Signed)
Requested medication (s) are due for refill today: yes  Requested medication (s) are on the active medication list: yes  Last refill:  08/30/18 for 20 tabs  Future visit scheduled: yes  Notes to clinic:  Med can not be delegated  Requested Prescriptions  Pending Prescriptions Disp Refills   ondansetron (ZOFRAN-ODT) 4 MG disintegrating tablet [Pharmacy Med Name: ONDANSETRON ODT 4MG  TABLETS] 20 tablet 0    Sig: DISSOLVE 1 TABLET ON THE TONGUE EVERY 8 HOURS AS NEEDED FOR NAUSEA     Not Delegated - Gastroenterology: Antiemetics Failed - 09/17/2018  9:19 AM      Failed - This refill cannot be delegated      Passed - Valid encounter within last 6 months    Recent Outpatient Visits          4 weeks ago Type 2 diabetes mellitus without complication, without long-term current use of insulin ([place])   [place], [place] T, NP   2 months ago Annual physical exam   [place], [place] T, NP   5 months ago Hyperlipidemia, unspecified hyperlipidemia type   [facility] Kathrine Haddock, NP   8 months ago Non-seasonal allergic rhinitis, unspecified trigger   [facility] Merrie Roof [place], [place]   11 months ago Lip fissure   [facility] Kathrine Haddock, NP      Future Appointments            In 4 weeks Cannady, Barbaraann Faster, NP [facility], [place]   In 3 months [place], Barbaraann Faster, NP [facility], [facility]

## 2018-09-17 NOTE — Telephone Encounter (Signed)
Copied from [place] [phone]. Topic: Quick Communication - Rx Refill/Question >> Sep 17, 2018  9:18 AM Bea Graff, NT wrote: Medication: ondansetron (ZOFRAN-ODT) 4 MG disintegrating tablet, Miralax request to help with having BMs  Has the patient contacted their pharmacy? Yes.   (Agent: If no, request that the patient contact the pharmacy for the refill.) (Agent: If yes, when and what did the pharmacy advise?)  Preferred Pharmacy (with phone number or street name): [id number] Phillip Heal, [place] - [address] [phone] (Phone) [phone] (Fax)    Agent: Please be advised that RX refills may take up to 3 business days. We ask that you follow-up with your pharmacy.

## 2018-09-21 DIAGNOSIS — Z719 Counseling, unspecified: Secondary | ICD-10-CM | POA: Diagnosis not present

## 2018-09-26 ENCOUNTER — Other Ambulatory Visit: Payer: Self-pay | Admitting: Nurse Practitioner

## 2018-09-27 DIAGNOSIS — Z719 Counseling, unspecified: Secondary | ICD-10-CM | POA: Diagnosis not present

## 2018-09-27 NOTE — Telephone Encounter (Signed)
Requested medication (s) are due for refill today: Yes  Requested medication (s) are on the active medication list: Yes  Last refill:  09/17/18  Future visit scheduled: Yes  Notes to clinic:  Unable to refill per protocol, cannot be delegated.     Requested Prescriptions  Pending Prescriptions Disp Refills   ondansetron (ZOFRAN-ODT) 4 MG disintegrating tablet [Pharmacy Med Name: ONDANSETRON ODT 4MG  TABLETS] 20 tablet 0    Sig: DISSOLVE 1 TABLET ON THE TONGUE EVERY 8 HOURS AS NEEDED FOR NAUSEA     Not Delegated - Gastroenterology: Antiemetics Failed - 09/26/2018 11:27 AM      Failed - This refill cannot be delegated      Passed - Valid encounter within last 6 months    Recent Outpatient Visits          1 month ago Type 2 diabetes mellitus without complication, without long-term current use of insulin ([place])   [place], [place] T, NP   2 months ago Annual physical exam   [place], [place] T, NP   5 months ago Hyperlipidemia, unspecified hyperlipidemia type   [facility] Kathrine Haddock, NP   8 months ago Non-seasonal allergic rhinitis, unspecified trigger   [facility] Merrie Roof [place], [place]   11 months ago Lip fissure   [facility] Kathrine Haddock, NP      Future Appointments            In 2 weeks Cannady, Barbaraann Faster, NP [facility], [place]   In 3 months Cannady, Barbaraann Faster, NP [facility], [facility]

## 2018-10-03 ENCOUNTER — Other Ambulatory Visit: Payer: Self-pay | Admitting: Unknown Physician Specialty

## 2018-10-04 DIAGNOSIS — Z719 Counseling, unspecified: Secondary | ICD-10-CM | POA: Diagnosis not present

## 2018-10-09 ENCOUNTER — Other Ambulatory Visit: Payer: Self-pay | Admitting: Nurse Practitioner

## 2018-10-11 DIAGNOSIS — Z719 Counseling, unspecified: Secondary | ICD-10-CM | POA: Diagnosis not present

## 2018-10-11 NOTE — Telephone Encounter (Signed)
Requested medication (s) are due for refill today: yes  Requested medication (s) are on the active medication list: yes  Last refill:   09/27/2018 for 20 tabs  Future visit scheduled: yes  Notes to clinic:  Med can not be delegated  Requested Prescriptions  Pending Prescriptions Disp Refills   ondansetron (ZOFRAN-ODT) 4 MG disintegrating tablet [Pharmacy Med Name: ONDANSETRON ODT 4MG  TABLETS] 20 tablet 0    Sig: DISSOLVE 1 TABLET ON THE TONGUE EVERY 8 HOURS AS NEEDED FOR NAUSEA     Not Delegated - Gastroenterology: Antiemetics Failed - 10/09/2018  9:44 AM      Failed - This refill cannot be delegated      Passed - Valid encounter within last 6 months    Recent Outpatient Visits          1 month ago Type 2 diabetes mellitus without complication, without long-term current use of insulin ([place])   [place] [employer], [place] T, NP   2 months ago Annual physical exam   [place], [place] T, NP   6 months ago Hyperlipidemia, unspecified hyperlipidemia type   [facility] Kathrine Haddock, NP   9 months ago Non-seasonal allergic rhinitis, unspecified trigger   [facility] Merrie Roof [place], [place]   12 months ago Lip fissure   [facility] Kathrine Haddock, NP      Future Appointments            In 4 days Cannady, Barbaraann Faster, NP [facility], [place]   In 3 months [place], Barbaraann Faster, NP [facility], [facility]

## 2018-10-11 NOTE — Telephone Encounter (Signed)
Refill for Zofran approved

## 2018-10-15 ENCOUNTER — Ambulatory Visit (INDEPENDENT_AMBULATORY_CARE_PROVIDER_SITE_OTHER): Payer: 59 | Admitting: Nurse Practitioner

## 2018-10-15 ENCOUNTER — Encounter: Payer: Self-pay | Admitting: Nurse Practitioner

## 2018-10-15 VITALS — BP 104/66 | HR 64 | Temp 98.1°F | Ht 64.0 in | Wt 238.2 lb

## 2018-10-15 DIAGNOSIS — E119 Type 2 diabetes mellitus without complications: Secondary | ICD-10-CM | POA: Diagnosis not present

## 2018-10-15 LAB — BAYER DCA HB A1C WAIVED: HB A1C (BAYER DCA - WAIVED): 5.9 % (ref <7.0)

## 2018-10-15 NOTE — Progress Notes (Signed)
BP 104/66 (BP Location: Left Arm, Patient Position: Sitting, Cuff Size: Normal)   Pulse 64   Temp 98.1 F (36.7 C) (Oral)   Ht 5' 4"  (1.626 m)   Wt 238 lb 3.2 oz (108 kg)   SpO2 96%   BMI 40.89 kg/m    Subjective:    Patient ID: Lance Kim, male    DOB: 1970/11/29, 48 y.o.   MRN: [id number]  HPI: Lance Kim is a 48 y.o. male  Chief Complaint  Patient presents with  . Diabetes    3 month F/U   DIABETES Was started on Metformin in October, with A1C 7.3%.   Since this time he has lost almost 20 pounds, attended Diabetes education, and is working on regular exercise (walking group at work) and diet. Hypoglycemic episodes:no Polydipsia/polyuria: no Visual disturbance: no Chest pain: no Paresthesias: no Glucose Monitoring: yes  Accucheck frequency: BID  Fasting glucose: 73 - 99  Post prandial:  Evening: 91 - 115  Before meals: Taking Insulin?: no  Long acting insulin:  Short acting insulin: Blood Pressure Monitoring: not checking Retinal Examination: Up to Date, coming up in two months Foot Exam: Up to Date Diabetic Education: Completed Pneumovax: unknown Influenza: Up to Date Aspirin: yes  Relevant past medical, surgical, family and social history reviewed and updated as indicated. Interim medical history since our last visit reviewed. Allergies and medications reviewed and updated.  Review of Systems  Constitutional: Negative for activity change, diaphoresis, fatigue and fever.  Respiratory: Negative for cough, chest tightness, shortness of breath and wheezing.   Cardiovascular: Negative for chest pain, palpitations and leg swelling.  Gastrointestinal: Negative for abdominal distention, abdominal pain, constipation, diarrhea, nausea and vomiting.  Endocrine: Negative for cold intolerance, heat intolerance, polydipsia, polyphagia and polyuria.  Musculoskeletal: Negative.   Skin: Negative.   Neurological: Negative for dizziness, syncope, weakness,  light-headedness, numbness and headaches.  Psychiatric/Behavioral: Negative.     Per HPI unless specifically indicated above     Objective:    BP 104/66 (BP Location: Left Arm, Patient Position: Sitting, Cuff Size: Normal)   Pulse 64   Temp 98.1 F (36.7 C) (Oral)   Ht 5' 4"  (1.626 m)   Wt 238 lb 3.2 oz (108 kg)   SpO2 96%   BMI 40.89 kg/m   Wt Readings from Last 3 Encounters:  10/15/18 238 lb 3.2 oz (108 kg)  08/30/18 253 lb 12.8 oz (115.1 kg)  08/23/18 255 lb 1.6 oz (115.7 kg)    Physical Exam Vitals signs and nursing note reviewed.  Constitutional:      Appearance: He is well-developed.  HENT:     Head: Normocephalic and atraumatic.     Right Ear: Hearing normal. No drainage.     Left Ear: Hearing normal. No drainage.     Mouth/Throat:     Pharynx: Uvula midline.  Eyes:     General: Lids are normal.        Right eye: No discharge.        Left eye: No discharge.     Conjunctiva/sclera: Conjunctivae normal.     Pupils: Pupils are equal, round, and reactive to light.  Neck:     Musculoskeletal: Normal range of motion and neck supple.     Thyroid: No thyromegaly.     Vascular: No carotid bruit or JVD.     Trachea: Trachea normal.  Cardiovascular:     Rate and Rhythm: Normal rate and regular rhythm.     Heart sounds:  Normal heart sounds, S1 normal and S2 normal. No murmur. No gallop.   Pulmonary:     Effort: Pulmonary effort is normal.     Breath sounds: Normal breath sounds.  Abdominal:     General: Bowel sounds are normal.     Palpations: Abdomen is soft. There is no hepatomegaly or splenomegaly.  Musculoskeletal: Normal range of motion.     Right lower leg: No edema.     Left lower leg: No edema.  Skin:    General: Skin is warm and dry.     Capillary Refill: Capillary refill takes less than 2 seconds.     Findings: No rash.  Neurological:     Mental Status: He is alert and oriented to person, place, and time.     Deep Tendon Reflexes: Reflexes are normal  and symmetric.  Psychiatric:        Mood and Affect: Mood normal.        Behavior: Behavior normal.        Thought Content: Thought content normal.        Judgment: Judgment normal.     Results for orders placed or performed in visit on 07/14/18  CBC w/Diff  Result Value Ref Range   WBC 8.2 3.4 - 10.8 x10E3/uL   RBC 5.14 4.14 - 5.80 x10E6/uL   Hemoglobin 14.5 13.0 - 17.7 g/dL   Hematocrit 43.5 37.5 - 51.0 %   MCV 85 79 - 97 fL   MCH 28.2 26.6 - 33.0 pg   MCHC 33.3 31.5 - 35.7 g/dL   RDW 13.7 12.3 - 15.4 %   Platelets 279 150 - 450 x10E3/uL   Neutrophils 42 Not Estab. %   Lymphs 45 Not Estab. %   Monocytes 9 Not Estab. %   Eos 3 Not Estab. %   Basos 1 Not Estab. %   Neutrophils Absolute 3.4 1.4 - 7.0 x10E3/uL   Lymphocytes Absolute 3.8 (H) 0.7 - 3.1 x10E3/uL   Monocytes Absolute 0.7 0.1 - 0.9 x10E3/uL   EOS (ABSOLUTE) 0.2 0.0 - 0.4 x10E3/uL   Basophils Absolute 0.1 0.0 - 0.2 x10E3/uL   Immature Granulocytes 0 Not Estab. %   Immature Grans (Abs) 0.0 0.0 - 0.1 x10E3/uL  Comp Met (CMET)  Result Value Ref Range   Glucose 121 (H) 65 - 99 mg/dL   BUN 8 6 - 24 mg/dL   Creatinine, Ser 1.11 0.76 - 1.27 mg/dL   GFR calc non Af Amer 78 >59 mL/min/1.73   GFR calc Af Amer 90 >59 mL/min/1.73   BUN/Creatinine Ratio 7 (L) 9 - 20   Sodium 135 134 - 144 mmol/L   Potassium 4.0 3.5 - 5.2 mmol/L   Chloride 97 96 - 106 mmol/L   CO2 25 20 - 29 mmol/L   Calcium 9.4 8.7 - 10.2 mg/dL   Total Protein 7.3 6.0 - 8.5 g/dL   Albumin 4.4 3.5 - 5.5 g/dL   Globulin, Total 2.9 1.5 - 4.5 g/dL   Albumin/Globulin Ratio 1.5 1.2 - 2.2   Bilirubin Total 0.3 0.0 - 1.2 mg/dL   Alkaline Phosphatase 92 39 - 117 IU/L   AST 21 0 - 40 IU/L   ALT 23 0 - 44 IU/L  TSH  Result Value Ref Range   TSH 0.595 0.450 - 4.500 uIU/mL  HgB A1c  Result Value Ref Range   Hgb A1c MFr Bld 7.3 (H) 4.8 - 5.6 %   Est. average glucose Bld gHb Est-mCnc 163 mg/dL  Lipid Profile  Result Value Ref Range   Cholesterol, Total 128  100 - 199 mg/dL   Triglycerides 138 0 - 149 mg/dL   HDL 30 (L) >39 mg/dL   VLDL Cholesterol Cal 28 5 - 40 mg/dL   LDL Calculated 70 0 - 99 mg/dL   Chol/HDL Ratio 4.3 0.0 - 5.0 ratio  Microalbumin, Urine Waived (STAT)  Result Value Ref Range   Microalb, Ur Waived 10 0 - 19 mg/L   Creatinine, Urine Waived 50 10 - 300 mg/dL   Microalb/Creat Ratio <30 <30 mg/g      Assessment & Plan:   Problem List Items Addressed This Visit      Endocrine   Type 2 diabetes mellitus without complications (HCC) - Primary    Chronic, ongoing.  A1C today 5.9% and has lost almost 20 pounds.  Continue Metformin once a day 500MG.  If continued improvement in 3 months may trial off Metformin and focus on diet/weight loss only.  Return in 3 months.      Relevant Orders   Bayer DCA Hb A1c Waived     Other   Severe obesity (BMI >= 40) (HCC)    Has been focusing on diet and regular exercise with almost 20 pound weight loss at this time.          Follow up plan: Return in about 3 months (around 01/13/2019) for T2DM, HTN/HLD.

## 2018-10-15 NOTE — Assessment & Plan Note (Signed)
Has been focusing on diet and regular exercise with almost 20 pound weight loss at this time.

## 2018-10-15 NOTE — Patient Instructions (Signed)
Diabetes Basics    Diabetes (diabetes mellitus) is a long-term (chronic) disease. It occurs when the body does not properly use sugar (glucose) that is released from food after you eat.  Diabetes may be caused by one or both of these problems:  · Your pancreas does not make enough of a hormone called insulin.  · Your body does not react in a normal way to insulin that it makes.  Insulin lets sugars (glucose) go into cells in your body. This gives you energy. If you have diabetes, sugars cannot get into cells. This causes high blood sugar (hyperglycemia).  Follow these instructions at home:  How is diabetes treated?  You may need to take insulin or other diabetes medicines daily to keep your blood sugar in balance. Take your diabetes medicines every day as told by your doctor. List your diabetes medicines here:  Diabetes medicines  · Name of medicine: ______________________________  ? Amount (dose): _______________ Time (a.m./p.m.): _______________ Notes: ___________________________________  · Name of medicine: ______________________________  ? Amount (dose): _______________ Time (a.m./p.m.): _______________ Notes: ___________________________________  · Name of medicine: ______________________________  ? Amount (dose): _______________ Time (a.m./p.m.): _______________ Notes: ___________________________________  If you use insulin, you will learn how to give yourself insulin by injection. You may need to adjust the amount based on the food that you eat. List the types of insulin you use here:  Insulin  · Insulin type: ______________________________  ? Amount (dose): _______________ Time (a.m./p.m.): _______________ Notes: ___________________________________  · Insulin type: ______________________________  ? Amount (dose): _______________ Time (a.m./p.m.): _______________ Notes: ___________________________________  · Insulin type: ______________________________  ? Amount (dose): _______________ Time (a.m./p.m.):  _______________ Notes: ___________________________________  · Insulin type: ______________________________  ? Amount (dose): _______________ Time (a.m./p.m.): _______________ Notes: ___________________________________  · Insulin type: ______________________________  ? Amount (dose): _______________ Time (a.m./p.m.): _______________ Notes: ___________________________________  How do I manage my blood sugar?    Check your blood sugar levels using a blood glucose monitor as directed by your doctor.  Your doctor will set treatment goals for you. Generally, you should have these blood sugar levels:  · Before meals (preprandial): 80-130 mg/dL (4.4-7.2 mmol/L).  · After meals (postprandial): below 180 mg/dL (10 mmol/L).  · A1c level: less than 7%.  Write down the times that you will check your blood sugar levels:  Blood sugar checks  · Time: _______________ Notes: ___________________________________  · Time: _______________ Notes: ___________________________________  · Time: _______________ Notes: ___________________________________  · Time: _______________ Notes: ___________________________________  · Time: _______________ Notes: ___________________________________  · Time: _______________ Notes: ___________________________________    What do I need to know about low blood sugar?  Low blood sugar is called hypoglycemia. This is when blood sugar is at or below 70 mg/dL (3.9 mmol/L). Symptoms may include:  · Feeling:  ? Hungry.  ? Worried or nervous (anxious).  ? Sweaty and clammy.  ? Confused.  ? Dizzy.  ? Sleepy.  ? Sick to your stomach (nauseous).  · Having:  ? A fast heartbeat.  ? A headache.  ? A change in your vision.  ? Tingling or no feeling (numbness) around the mouth, lips, or tongue.  ? Jerky movements that you cannot control (seizure).  · Having trouble with:  ? Moving (coordination).  ? Sleeping.  ? Passing out (fainting).  ? Getting upset easily (irritability).  Treating low blood sugar  To treat low blood  sugar, eat or drink something sugary right away. If you can think clearly and swallow safely, follow the 15:15   rule:  · Take 15 grams of a fast-acting carb (carbohydrate). Talk with your doctor about how much you should take.  · Some fast-acting carbs are:  ? Sugar tablets (glucose pills). Take 3-4 glucose pills.  ? 6-8 pieces of hard candy.  ? 4-6 oz (120-150 mL) of fruit juice.  ? 4-6 oz (120-150 mL) of regular (not diet) soda.  ? 1 Tbsp (15 mL) honey or sugar.  · Check your blood sugar 15 minutes after you take the carb.  · If your blood sugar is still at or below 70 mg/dL (3.9 mmol/L), take 15 grams of a carb again.  · If your blood sugar does not go above 70 mg/dL (3.9 mmol/L) after 3 tries, get help right away.  · After your blood sugar goes back to normal, eat a meal or a snack within 1 hour.  Treating very low blood sugar  If your blood sugar is at or below 54 mg/dL (3 mmol/L), you have very low blood sugar (severe hypoglycemia). This is an emergency. Do not wait to see if the symptoms will go away. Get medical help right away. Call your local emergency services (911 in the [place] Do not drive yourself to the hospital.  Questions to ask your health care provider  · Do I need to meet with a diabetes educator?  · What equipment will I need to care for myself at home?  · What diabetes medicines do I need? When should I take them?  · How often do I need to check my blood sugar?  · What number can I call if I have questions?  · When is my next doctor's visit?  · Where can I find a support group for people with diabetes?  Where to find more information  · American Diabetes Association: www.diabetes.org  · American Association of Diabetes Educators: www.diabeteseducator.org/patient-resources  Contact a doctor if:  · Your blood sugar is at or above 240 mg/dL (13.3 mmol/L) for 2 days in a row.  · You have been sick or have had a fever for 2 days or more, and you are not getting better.  · You have any of these  problems for more than 6 hours:  ? You cannot eat or drink.  ? You feel sick to your stomach (nauseous).  ? You throw up (vomit).  ? You have watery poop (diarrhea).  Get help right away if:  · Your blood sugar is lower than 54 mg/dL (3 mmol/L).  · You get confused.  · You have trouble:  ? Thinking clearly.  ? Breathing.  Summary  · Diabetes (diabetes mellitus) is a long-term (chronic) disease. It occurs when the body does not properly use sugar (glucose) that is released from food after digestion.  · Take insulin and diabetes medicines as told.  · Check your blood sugar every day, as often as told.  · Keep all follow-up visits as told by your doctor. This is important.  This information is not intended to replace advice given to you by your health care provider. Make sure you discuss any questions you have with your health care provider.  Document Released: 12/04/2017 Document Revised: 02/22/2018 Document Reviewed: 12/04/2017  Elsevier Interactive Patient Education © 2019 [employer]

## 2018-10-15 NOTE — Assessment & Plan Note (Signed)
Chronic, ongoing.  A1C today 5.9% and has lost almost 20 pounds.  Continue Metformin once a day 500MG .  If continued improvement in 3 months may trial off Metformin and focus on diet/weight loss only.  Return in 3 months.

## 2018-10-18 DIAGNOSIS — Z719 Counseling, unspecified: Secondary | ICD-10-CM | POA: Diagnosis not present

## 2018-10-25 DIAGNOSIS — Z719 Counseling, unspecified: Secondary | ICD-10-CM | POA: Diagnosis not present

## 2018-11-01 DIAGNOSIS — Z719 Counseling, unspecified: Secondary | ICD-10-CM | POA: Diagnosis not present

## 2018-11-02 ENCOUNTER — Other Ambulatory Visit: Payer: Self-pay | Admitting: Unknown Physician Specialty

## 2018-11-08 DIAGNOSIS — Z719 Counseling, unspecified: Secondary | ICD-10-CM | POA: Diagnosis not present

## 2018-11-15 DIAGNOSIS — Z719 Counseling, unspecified: Secondary | ICD-10-CM | POA: Diagnosis not present

## 2018-11-22 DIAGNOSIS — Z719 Counseling, unspecified: Secondary | ICD-10-CM | POA: Diagnosis not present

## 2018-11-29 DIAGNOSIS — Z719 Counseling, unspecified: Secondary | ICD-10-CM | POA: Diagnosis not present

## 2018-12-06 DIAGNOSIS — Z719 Counseling, unspecified: Secondary | ICD-10-CM | POA: Diagnosis not present

## 2018-12-09 ENCOUNTER — Other Ambulatory Visit: Payer: Self-pay | Admitting: Family Medicine

## 2018-12-09 NOTE — Telephone Encounter (Signed)
Requested Prescriptions  Pending Prescriptions Disp Refills  . cetirizine (ZYRTEC) 10 MG tablet [Pharmacy Med Name: CETIRIZINE 10MG  TABLETS] 90 tablet 0    Sig: TAKE 1 TABLET BY MOUTH EVERY DAY     Ear, Nose, and Throat:  Antihistamines Passed - 12/09/2018  8:09 AM      Passed - Valid encounter within last 12 months    Recent Outpatient Visits          1 month ago Type 2 diabetes mellitus without complication, without long-term current use of insulin ([place])   [place], Danielson T, NP   3 months ago Type 2 diabetes mellitus without complication, without long-term current use of insulin ([place])   Bethany [place], [place] T, NP   4 months ago Annual physical exam   [place], [place] T, NP   8 months ago Hyperlipidemia, unspecified hyperlipidemia type   [facility] Kathrine Haddock, NP   11 months ago Non-seasonal allergic rhinitis, unspecified trigger   [place], Lilia Argue, PA-C      Future Appointments            In 1 month Cannady, Barbaraann Faster, NP [employer]

## 2018-12-10 ENCOUNTER — Telehealth: Payer: Self-pay | Admitting: Nurse Practitioner

## 2018-12-10 ENCOUNTER — Other Ambulatory Visit: Payer: Self-pay | Admitting: Nurse Practitioner

## 2018-12-10 MED ORDER — LISINOPRIL-HYDROCHLOROTHIAZIDE 20-12.5 MG PO TABS: 1.0000 | ORAL_TABLET | Freq: Every day | ORAL | 2 refills | Status: DC

## 2018-12-10 MED ORDER — AMLODIPINE BESYLATE 10 MG PO TABS: ORAL_TABLET | ORAL | 2 refills | Status: DC

## 2018-12-10 NOTE — Telephone Encounter (Signed)
Copied from [place] [id number]. Topic: Quick Communication - Rx Refill/Question >> Dec 10, 2018 11:17 AM Blase Mess A wrote: Medication: lisinopril-hydrochlorothiazide (PRINZIDE,ZESTORETIC) 20-12.5 MG tablet [[id number], amLODipine (NORVASC) 10 MG tablet [[id number]]   Has the patient contacted their pharmacy? Yes  (Agent: If no, request that the patient contact the pharmacy for the refill.) (Agent: If yes, when and what did the pharmacy advise?)  Preferred Pharmacy (with phone number or street name): amLODipine (NORVASC) 10 MG tablet [[id number]]   Agent: Please be advised that RX refills may take up to 3 business days. We ask that you follow-up with your pharmacy.

## 2018-12-10 NOTE — Telephone Encounter (Signed)
Complete

## 2018-12-10 NOTE — Progress Notes (Signed)
Refills on Lisinipril-HCTZ and Amlodipine sent.

## 2018-12-13 ENCOUNTER — Ambulatory Visit (INDEPENDENT_AMBULATORY_CARE_PROVIDER_SITE_OTHER): Payer: 59 | Admitting: Nurse Practitioner

## 2018-12-13 ENCOUNTER — Encounter: Payer: Self-pay | Admitting: Nurse Practitioner

## 2018-12-13 ENCOUNTER — Telehealth: Payer: Self-pay | Admitting: Nurse Practitioner

## 2018-12-13 ENCOUNTER — Other Ambulatory Visit: Payer: Self-pay

## 2018-12-13 DIAGNOSIS — Z719 Counseling, unspecified: Secondary | ICD-10-CM | POA: Diagnosis not present

## 2018-12-13 DIAGNOSIS — G5702 Lesion of sciatic nerve, left lower limb: Secondary | ICD-10-CM

## 2018-12-13 MED ORDER — PREDNISONE 10 MG PO TABS: ORAL_TABLET | ORAL | 0 refills | Status: DC

## 2018-12-13 MED ORDER — CYCLOBENZAPRINE HCL 10 MG PO TABS: 10.0000 mg | ORAL_TABLET | Freq: Three times a day (TID) | ORAL | 0 refills | Status: DC | PRN

## 2018-12-13 NOTE — Telephone Encounter (Signed)
Patient scheduled for this afternoon. Will download skype.

## 2018-12-13 NOTE — Telephone Encounter (Signed)
Copied from [place] [id number]. Topic: Appointment Scheduling - Scheduling Inquiry for Clinic >> Dec 13, 2018  9:28 AM Alanda Slim E wrote: Reason for CRM: Pt wants to schedule an appt For his back spasms. Pt used to see cheryl and was prescribed a medication for his back issue and wanted to know if they can be refilled/ please advise

## 2018-12-13 NOTE — Telephone Encounter (Signed)
On review patient did have Flexeril ordered last January 2019.  Will see if patient can do virtual visit tomorrow via visual aide so ROM can be assessed.

## 2018-12-13 NOTE — Progress Notes (Signed)
Wt 224 lb 6.4 oz (101.8 kg)   BMI 38.52 kg/m    Subjective:    Patient ID: Lance Kim, male    DOB: 10-19-1970, 48 y.o.   MRN: [id number]  HPI: Man Effertz is a 48 y.o. male  Chief Complaint  Patient presents with  . Back Pain    . This visit was completed via WebEx due to the restrictions of the COVID-19 pandemic. All issues as above were discussed and addressed. Physical exam was done as above through visual confirmation on WebEx. If it was felt that the patient should be evaluated in the office, they were directed there. The patient verbally consented to this visit. . Location of the patient: work . Location of the provider: work . Those involved with this call:  . Provider: Marnee Guarneri, DNP, AGPCNP-C . CMA: Merilyn Baba, [place] . Front Desk/Registration: Don Perking  . Time spent on call: 15 minutes with patient face to face via video conference. More than 50% of this time was spent in counseling and coordination of care.  BACK PAIN Started on Thursday.  Has taken Flexeril in January 2019 for back spasms.  Pain is more to lower left back with burning radiation to above knee. Duration: days Mechanism of injury: lifting Location: Left and low back Onset: gradual Severity: 10/10 at worst, 5/10 at best Quality: sharp and aching Frequency: intermittent Radiation: L leg above the knee Aggravating factors: prolonged sitting Alleviating factors: NSAIDs Status: fluctuating Treatments attempted: ibuprofen  Relief with NSAIDs?: moderate Nighttime pain:  no, difficult to get up after prolonged lying Paresthesias / decreased sensation:  no Bowel / bladder incontinence:  no Fevers:  no Dysuria / urinary frequency:  no   Relevant past medical, surgical, family and social history reviewed and updated as indicated. Interim medical history since our last visit reviewed. Allergies and medications reviewed and updated.  Review of Systems  Constitutional: Negative  for activity change, diaphoresis, fatigue and fever.  Respiratory: Negative for cough, chest tightness, shortness of breath and wheezing.   Cardiovascular: Negative for chest pain, palpitations and leg swelling.  Gastrointestinal: Negative for abdominal distention, abdominal pain, constipation, diarrhea, nausea and vomiting.  Endocrine: Negative for cold intolerance, heat intolerance, polydipsia, polyphagia and polyuria.  Musculoskeletal: Positive for back pain.  Skin: Negative.   Neurological: Negative for dizziness, syncope, weakness, light-headedness, numbness and headaches.  Psychiatric/Behavioral: Negative.     Per HPI unless specifically indicated above     Objective:    Wt 224 lb 6.4 oz (101.8 kg)   BMI 38.52 kg/m   Wt Readings from Last 3 Encounters:  12/13/18 224 lb 6.4 oz (101.8 kg)  10/15/18 238 lb 3.2 oz (108 kg)  08/30/18 253 lb 12.8 oz (115.1 kg)    Physical Exam Vitals signs and nursing note reviewed.  Constitutional:      General: He is awake.     Appearance: He is well-developed. He is obese.  HENT:     Head: Normocephalic and atraumatic.     Right Ear: Hearing normal.     Left Ear: Hearing normal.  Neck:     Musculoskeletal: Normal range of motion.  Cardiovascular:     Comments: Unable to auscultate via Webex. Pulmonary:     Effort: Pulmonary effort is normal.     Comments: Unable to auscultate via Webex. Abdominal:     Palpations: Abdomen is soft.     Comments: Patient palpated and reports soft abdomen.   Musculoskeletal:  Lumbar back: He exhibits decreased range of motion, tenderness and pain. He exhibits no swelling, no edema and no laceration.     Comments: Patient able to perform ROM with provider via Webex and noted decrease flexion and extension with slight decrease lateral left and rotation.  Pain noted in left gluteus with radiation down to above left knee with position changes.  Denies pain with straight leg movement.  Reports tenderness  when palpates to mid-left gluteus.    Skin:    Findings: No rash.     Comments: Reports no rash noted to back.  Neurological:     Mental Status: He is alert and oriented to person, place, and time.  Psychiatric:        Mood and Affect: Mood normal.        Behavior: Behavior normal. Behavior is cooperative.        Thought Content: Thought content normal.        Judgment: Judgment normal.     Results for orders placed or performed in visit on 10/15/18  Bayer DCA Hb A1c Waived  Result Value Ref Range   HB A1C (BAYER DCA - WAIVED) 5.9 <7.0 %      Assessment & Plan:   Problem List Items Addressed This Visit      Nervous and Auditory   Piriformis syndrome of left side    Acute.  Script for Prednisone taper and Flexeril sent.  Recommend monitoring blood sugar closely with Prednisone taper and notify provider if elevations above 300 noted.  Increase fluid intake.  Gentle stretches at home.  May use Ibuprofen minimally at home, recommend Tylenol and Icy/Hot patches for discomfort.  Return for worsening or continued symptoms.      Relevant Medications   cyclobenzaprine (FLEXERIL) 10 MG tablet      I discussed the assessment and treatment plan with the patient. The patient was provided an opportunity to ask questions and all were answered. The patient agreed with the plan and demonstrated an understanding of the instructions  The patient was advised to call back or seek an in-person evaluation if the symptoms worsen or if the condition fails to improve as anticipated.  I provided 15 minutes of time during this encounter.   Follow up plan: Return if symptoms worsen or fail to improve.

## 2018-12-13 NOTE — Assessment & Plan Note (Signed)
Acute.  Script for Prednisone taper and Flexeril sent.  Recommend monitoring blood sugar closely with Prednisone taper and notify provider if elevations above 300 noted.  Increase fluid intake.  Gentle stretches at home.  May use Ibuprofen minimally at home, recommend Tylenol and Icy/Hot patches for discomfort.  Return for worsening or continued symptoms.

## 2018-12-13 NOTE — Patient Instructions (Signed)
Piriformis Syndrome    Piriformis syndrome is a condition that can cause pain and numbness in your buttocks and down the back of your leg. Piriformis syndrome happens when the small muscle that connects the base of your spine to your hip (piriformis muscle) presses on the nerve that runs down the back of your leg (sciatic nerve).  The piriformis muscle helps your hip rotate and helps to bring your leg back and out. It also helps shift your weight while you are walking to keep you stable. The sciatic nerve runs under or through the piriformis. Damage to the piriformis muscle can cause spasms that put pressure on the nerve below. This causes pain and discomfort while sitting and moving. The pain may feel as if it begins in the buttock and spreads (radiates) down your hip and thigh.  What are the causes?  This condition is caused by pressure on the sciatic nerve from the piriformis muscle. The piriformis muscle can get irritated with overuse, especially if other hip muscles are weak and the piriformis has to do extra work. Piriformis syndrome can also occur after an injury, like a fall onto your buttocks.  What increases the risk?  This condition is more likely to develop in:  · Women.  · People who sit for long periods of time.  · Cyclists.  · People who have weak buttocks muscles (gluteal muscles).  What are the signs or symptoms?  Pain, tingling, or numbness that starts in the buttock and runs down the back of your leg (sciatica) is the most common symptom of this condition. Your symptoms may:  · Get worse the longer you sit.  · Get worse when you walk, run, or go up on stairs.  How is this diagnosed?  This condition is diagnosed based on your symptoms, medical history, and physical exam. During this exam, your health care provider may move your leg into different positions to check for pain. He or she will also press on the muscles of your hip and buttock to see if that increases your symptoms. You may also have an  X-ray or MRI.  How is this treated?  Treatment for this condition may include:  · Stopping all activities that cause pain or make your condition worse.  · Using heat or ice to relieve pain as told by your health care provider.  · Taking medicines to reduce pain and swelling.  · Taking a muscle relaxer to release the piriformis muscle.  · Doing range-of-motion and strengthening exercises (physical therapy) as told by your health care provider.  · Massaging the affected area.  · Getting an injection of an anti-inflammatory medicine or muscle relaxer to reduce inflammation and muscle tension.  In rare cases, you may need surgery to cut the muscle and release pressure on the nerve if other treatments do not work.  Follow these instructions at home:  · Take over-the-counter and prescription medicines only as told by your health care provider.  · Do not sit for long periods. Get up and walk around every 20 minutes or as often as told by your health care provider.  · If directed, apply heat to the affected area as often as told by your health care provider. Use the heat source that your health care provider recommends, such as a moist heat pack or a heating pad.  ? Place a towel between your skin and the heat source.  ? Leave the heat on for 20-30 minutes.  ? Remove the   heat if your skin turns bright red. This is especially important if you are unable to feel pain, heat, or cold. You may have a greater risk of getting burned.  · If directed, apply ice to the injured area.  ? Put ice in a plastic bag.  ? Place a towel between your skin and the bag.  ? Leave the ice on for 20 minutes, 2-3 times a day.  · Do exercises as told by your health care provider.  · Return to your normal activities as told by your health care provider. Ask your health care provider what activities are safe for you.  · Keep all follow-up visits as told by your health care provider. This is important.  How is this prevented?  · Do not sit for longer  than 20 minutes at a time. When you sit, choose padded surfaces.  · Warm up and stretch before being active.  · Cool down and stretch after being active.  · Give your body time to rest between periods of activity.  · Make sure to use equipment that fits you.  · Maintain physical fitness, including:  ? Strength.  ? Flexibility.  Contact a health care provider if:  · Your pain and stiffness continue or get worse.  · Your leg or hip becomes weak.  · You have changes in your bowel function or bladder function.  This information is not intended to replace advice given to you by your health care provider. Make sure you discuss any questions you have with your health care provider.  Document Released: 09/01/2005 Document Revised: 05/06/2016 Document Reviewed: 08/14/2015  Elsevier Interactive Patient Education © 2019 Elsevier Inc.

## 2018-12-20 ENCOUNTER — Ambulatory Visit (INDEPENDENT_AMBULATORY_CARE_PROVIDER_SITE_OTHER): Payer: 59 | Admitting: Nurse Practitioner

## 2018-12-20 ENCOUNTER — Ambulatory Visit: Payer: Self-pay | Admitting: Nurse Practitioner

## 2018-12-20 ENCOUNTER — Encounter: Payer: Self-pay | Admitting: Nurse Practitioner

## 2018-12-20 ENCOUNTER — Other Ambulatory Visit: Payer: Self-pay

## 2018-12-20 VITALS — Wt 225.0 lb

## 2018-12-20 DIAGNOSIS — E119 Type 2 diabetes mellitus without complications: Secondary | ICD-10-CM

## 2018-12-20 DIAGNOSIS — E785 Hyperlipidemia, unspecified: Secondary | ICD-10-CM | POA: Diagnosis not present

## 2018-12-20 DIAGNOSIS — E1169 Type 2 diabetes mellitus with other specified complication: Secondary | ICD-10-CM

## 2018-12-20 NOTE — Telephone Encounter (Signed)
We can do video chat this afternoon or telephone even for this one.

## 2018-12-20 NOTE — Patient Instructions (Signed)
Hypoglycemia Hypoglycemia is when the sugar (glucose) level in your blood is too low. Signs of low blood sugar may include:  Feeling: ? Hungry. ? Worried or nervous (anxious). ? Sweaty and clammy. ? Confused. ? Dizzy. ? Sleepy. ? Sick to your stomach (nauseous).  Having: ? A fast heartbeat. ? A headache. ? A change in your vision. ? Tingling or no feeling (numbness) around your mouth, lips, or tongue. ? Jerky movements that you cannot control (seizure).  Having trouble with: ? Moving (coordination). ? Sleeping. ? Passing out (fainting). ? Getting upset easily (irritability). Low blood sugar can happen to people who have diabetes and people who do not have diabetes. Low blood sugar can happen quickly, and it can be an emergency. Treating low blood sugar Low blood sugar is often treated by eating or drinking something sugary right away, such as:  Fruit juice, 4-6 oz (120-150 mL).  Regular soda (not diet soda), 4-6 oz (120-150 mL).  Low-fat milk, 4 oz (120 mL).  Several pieces of hard candy.  Sugar or honey, 1 Tbsp (15 mL). Treating low blood sugar if you have diabetes If you can think clearly and swallow safely, follow the 15:15 rule:  Take 15 grams of a fast-acting carb (carbohydrate). Talk with your doctor about how much you should take.  Always keep a source of fast-acting carb with you, such as: ? Sugar tablets (glucose pills). Take 3-4 pills. ? 6-8 pieces of hard candy. ? 4-6 oz (120-150 mL) of fruit juice. ? 4-6 oz (120-150 mL) of regular (not diet) soda. ? 1 Tbsp (15 mL) honey or sugar.  Check your blood sugar 15 minutes after you take the carb.  If your blood sugar is still at or below 70 mg/dL (3.9 mmol/L), take 15 grams of a carb again.  If your blood sugar does not go above 70 mg/dL (3.9 mmol/L) after 3 tries, get help right away.  After your blood sugar goes back to normal, eat a meal or a snack within 1 hour.  Treating very low blood sugar If your  blood sugar is at or below 54 mg/dL (3 mmol/L), you have very low blood sugar (severe hypoglycemia). This may also cause:  Passing out.  Jerky movements you cannot control (seizure).  Losing consciousness (coma). This is an emergency. Do not wait to see if the symptoms will go away. Get medical help right away. Call your local emergency services (911 in the [place] Do not drive yourself to the hospital. If you have very low blood sugar and you cannot eat or drink, you may need a glucagon shot (injection). A family member or friend should learn how to check your blood sugar and how to give you a glucagon shot. Ask your doctor if you need to have a glucagon shot kit at home. Follow these instructions at home: General instructions  Take over-the-counter and prescription medicines only as told by your doctor.  Stay aware of your blood sugar as told by your doctor.  Limit alcohol intake to no more than 1 drink a day for nonpregnant women and 2 drinks a day for men. One drink equals 12 oz of beer (355 mL), 5 oz of wine (148 mL), or 1 oz of hard liquor (44 mL).  Keep all follow-up visits as told by your doctor. This is important. If you have diabetes:   Follow your diabetes care plan as told by your doctor. Make sure you: ? Know the signs of low blood sugar. ?  Take your medicines as told. °? Follow your exercise and meal plan. °? Eat on time. Do not skip meals. °? Check your blood sugar as often as told by your doctor. Always check it before and after exercise. °? Follow your sick day plan when you cannot eat or drink normally. Make this plan ahead of time with your doctor. °· Share your diabetes care plan with: °? Your work or school. °? People you live with. °· Check your pee (urine) for ketones: °? When you are sick. °? As told by your doctor. °· Carry a card or wear jewelry that says you have diabetes. °Contact a doctor if: °· You have trouble keeping your blood sugar in your target  range. °· You have low blood sugar often. °Get help right away if: °· You still have symptoms after you eat or drink something sugary. °· Your blood sugar is at or below 54 mg/dL (3 mmol/L). °· You have jerky movements that you cannot control. °· You pass out. °These symptoms may be an emergency. Do not wait to see if the symptoms will go away. Get medical help right away. Call your local emergency services (911 in the [place] Do not drive yourself to the hospital. °Summary °· Hypoglycemia happens when the level of sugar (glucose) in your blood is too low. °· Low blood sugar can happen to people who have diabetes and people who do not have diabetes. Low blood sugar can happen quickly, and it can be an emergency. °· Make sure you know the signs of low blood sugar and know how to treat it. °· Always keep a source of sugar (fast-acting carb) with you to treat low blood sugar. °This information is not intended to replace advice given to you by your health care provider. Make sure you discuss any questions you have with your health care provider. °Document Released: 11/26/2009 Document Revised: 02/23/2018 Document Reviewed: 10/05/2015 °Elsevier Interactive Patient Education © 2019 Elsevier Inc. ° °

## 2018-12-20 NOTE — Telephone Encounter (Signed)
  Pt called in c/o low glucose levels in the mornings and higher than normal glucose in the evenings since starting on prednisone and a muscle relaxer Marnee Guarneri, NP prescribed for him last week. His glucose was 58 this morning at 6:50.   He treated it and ate breakfast and is fine now but it's not his normal blood sugars.  He did vomit once this morning but feels fine now.  I let him know the office would call him to set up a video chat/phone call visit.   He was agreeable to this.   "I did that last week with Jolene so I'm familiar with it".  I sent these notes to Marnee Guarneri, NP for further disposition.   Reason for Disposition . [1] Blood glucose < 70  mg/dL (3.9 mmol/L) or symptomatic, now improved with Care Advice AND [2] cause unknown    Pt started on a muscle relaxer and prednisone last week.   It has made blood sugars low in the mornings.  Answer Assessment - Initial Assessment Questions 1. SYMPTOMS: "What symptoms are you concerned about?"     Blood sugar is 58 at 6:50.    I took it at 8:00 75 after drinking some cool ade.   I saw Dr. Ned Card last week.   She gave me a muscle relaxer and prednisone.    Should I take the medicine this morning.   My sugars are high at bed time it was 146 last night.   Before the medication it would be around 114.   2. ONSET:  "When did the symptoms start?"     On the 3rd a Friday it was 68 that morning. 3. BLOOD GLUCOSE: "What is your blood glucose level?"      75 now. 4. USUAL RANGE: "What is your blood glucose level usually?" (e.g., usual fasting morning value, usual evening value)     114 around that in the mornings.   5. TYPE 1 or 2:  "Do you know what type of diabetes you have?"  (e.g., Type 1, Type 2, Gestational; doesn't know)      I take metformin. 6. INSULIN: "Do you take insulin?" "What type of insulin(s) do you use? What is the mode of delivery? (syringe, pen (e.g., injection or  pump)      No 7. DIABETES PILLS: "Do you take any  pills for your diabetes?"     Metformin 8. OTHER SYMPTOMS: "Do you have any symptoms?" (e.g., fever, frequent urination, difficulty breathing, vomiting)     I woke up this morning and vomited once.   I feel ok.   I ate some breakfast so I'm better now. 9. LOW BLOOD GLUCOSE TREATMENT: "What have you done so far to treat the low blood glucose level?"     Drank Cool Aid 2 oz and 2 peppermints.   I just ate breakfast now.   10. FOOD: "When did you last eat or drink?"       Just ate breakfast. 11. ALONE: "Are you alone right now or is someone with you?"        People are here with me. 12. PREGNANCY: "Is there any chance you are pregnant?" "When was your last menstrual period?"       N/A  Protocols used: DIABETES - LOW BLOOD SUGAR-A-AH

## 2018-12-20 NOTE — Progress Notes (Addendum)
Wt 225 lb (102.1 kg) Comment: Patient reported  BMI 38.62 kg/m    Subjective:    Patient ID: Lance Kim, male    DOB: July 07, 1971, 48 y.o.   MRN: [id number]  HPI: Lance Kim is a 48 y.o. male  Chief Complaint  Patient presents with  . Diabetes    . This visit was completed via WebEx due to the restrictions of the COVID-19 pandemic. All issues as above were discussed and addressed. Physical exam was done as above through visual confirmation on WebEx. If it was felt that the patient should be evaluated in the office, they were directed there. The patient verbally consented to this visit. . Location of the patient: home . Location of the provider: home . Those involved with this call:  . Provider: Marnee Guarneri, DNP . CMA: Tiffany Reel, CMA . Front Desk/Registration: Don Perking  . Time spent on call: 15 minutes with patient face to face via video conference. More than 50% of this time was spent in counseling and coordination of care.   DIABETES Started on Metformin in November 2019 due to A1C 7.3.  Recent A1C 10/15/18 was 5.9% with Metformin and diet changes + weight loss.  Currently taking Metformin 500 MG once a day.  Was ordered Prednisone for short period due to Piriformis syndrome, along with Flexeril.  He has not started either of these yet and wondered if he could with his BS.  When he got up this morning, he was not feeling good and had bout of emesis.  Blood sugar at time was 58.  The last few mornings his BS has been 58 to 89 and at night 115 to 152 (last night 146).  Reports he is eating a snack in the evening (peanut butter crackers) and continues to focus heavily on diet and exercise.  Reports he is "feeling better" this afternoon and denies weakness, diaphoresis, N&V, abdominal pain, diarrhea, dizziness, or fatigue.   Hypoglycemic episodes:yes Polydipsia/polyuria: no Visual disturbance: no Chest pain: no Paresthesias: no Glucose Monitoring: yes   Accucheck frequency: BID  Fasting glucose: 90-100 range often, lower past few mornings  Post prandial:  Evening: 150's  Before meals: Taking Insulin?: no  Long acting insulin:  Short acting insulin: Blood Pressure Monitoring: not checking Retinal Examination: Up to Date Foot Exam: Up to Date Diabetic Education: Completed Aspirin: yes  Relevant past medical, surgical, family and social history reviewed and updated as indicated. Interim medical history since our last visit reviewed. Allergies and medications reviewed and updated.  Review of Systems  Constitutional: Negative for activity change, fatigue and fever.  Respiratory: Negative for cough, chest tightness, shortness of breath and wheezing.   Cardiovascular: Negative for chest pain, palpitations and leg swelling.  Gastrointestinal: Positive for nausea (x one this morning and no further) and vomiting (x one this morning and no further). Negative for abdominal distention and abdominal pain.  Endocrine: Negative for cold intolerance, polydipsia, polyphagia and polyuria.  Allergic/Immunologic: Negative.   Neurological: Negative for dizziness, syncope, weakness, numbness and headaches.  Psychiatric/Behavioral: Negative for behavioral problems.    Per HPI unless specifically indicated above     Objective:    Wt 225 lb (102.1 kg) Comment: Patient reported  BMI 38.62 kg/m   Wt Readings from Last 3 Encounters:  12/20/18 225 lb (102.1 kg)  12/13/18 224 lb 6.4 oz (101.8 kg)  10/15/18 238 lb 3.2 oz (108 kg)    Physical Exam Vitals signs and nursing note reviewed.  Constitutional:  General: He is awake.     Appearance: He is well-developed. He is obese.  HENT:     Head: Normocephalic.     Right Ear: Hearing normal.     Left Ear: Hearing normal.  Eyes:     General: Lids are normal.  Neck:     Musculoskeletal: Normal range of motion.  Cardiovascular:     Comments: Unable to auscultate due to virtual visit. Pulmonary:      Effort: Pulmonary effort is normal. No accessory muscle usage or respiratory distress.     Comments: Unable to auscultate due to virtual visit.  No SOB with talking. Abdominal:     Palpations: Abdomen is soft.     Tenderness: There is no abdominal tenderness.     Comments: Unable to auscultate due to virtual visit.  Reports his abdomen is soft to touch and he has no tenderness on self palpation.   Musculoskeletal: Normal range of motion.  Skin:    General: Skin is warm.     Comments: He reports his skin is warm and dry.  Neurological:     Mental Status: He is alert and oriented to person, place, and time.  Psychiatric:        Mood and Affect: Mood normal.        Behavior: Behavior normal. Behavior is cooperative.        Thought Content: Thought content normal.        Judgment: Judgment normal.     Comments: Alert and very talkative.     Results for orders placed or performed in visit on 10/15/18  Bayer DCA Hb A1c Waived  Result Value Ref Range   HB A1C (BAYER DCA - WAIVED) 5.9 <7.0 %      Assessment & Plan:   Problem List Items Addressed This Visit      Endocrine   Hyperlipidemia associated with type 2 diabetes mellitus ([place])   Relevant Orders   Lipid Panel w/o Chol/HDL Ratio   Type 2 diabetes mellitus without complication, without long-term current use of insulin (HCC) - Primary    Chronic, stable.  Recommend holding daily Metformin dose if FSBS <70 and ensuring he continues snack at HS + healthy snacks throughout day.  Discussed Rule of 15 for blood sugar <70.   Plan to repeat A1C at end of April and if continued good control will consider discontinuation of Metformin and continued focus on diet and weight loss.  Is aware that Prednisone taper may elevate BS for short period and to monitor closely.  Will follow-up in 4 weeks with A1C and visit.        Relevant Orders   Bayer DCA Hb A1c Waived   Basic Metabolic Panel (BMET)      I discussed the assessment and  treatment plan with the patient. The patient was provided an opportunity to ask questions and all were answered. The patient agreed with the plan and demonstrated an understanding of the instructions.   The patient was advised to call back or seek an in-person evaluation if the symptoms worsen or if the condition fails to improve as anticipated.  Patient is very structured with current regimen and aware to call provider with any concerns.   I provided 15 minutes of time during this encounter.  Follow up plan: Return in about 4 weeks (around 01/17/2019) for T2DM and HTN/HLD (labs needed).

## 2018-12-20 NOTE — Assessment & Plan Note (Addendum)
Chronic, stable.  Recommend holding daily Metformin dose if FSBS <70 and ensuring he continues snack at HS + healthy snacks throughout day.  Discussed Rule of 15 for blood sugar <70.   Plan to repeat A1C at end of April and if continued good control will consider discontinuation of Metformin and continued focus on diet and weight loss.  Is aware that Prednisone taper may elevate BS for short period and to monitor closely.  Will follow-up in 4 weeks with A1C and visit.

## 2018-12-27 ENCOUNTER — Other Ambulatory Visit: Payer: Self-pay | Admitting: Nurse Practitioner

## 2019-01-12 ENCOUNTER — Ambulatory Visit: Payer: 59 | Admitting: Nurse Practitioner

## 2019-01-14 ENCOUNTER — Ambulatory Visit: Payer: 59 | Admitting: Nurse Practitioner

## 2019-01-26 ENCOUNTER — Other Ambulatory Visit: Payer: Self-pay

## 2019-01-26 ENCOUNTER — Encounter: Payer: Self-pay | Admitting: Nurse Practitioner

## 2019-01-26 ENCOUNTER — Ambulatory Visit (INDEPENDENT_AMBULATORY_CARE_PROVIDER_SITE_OTHER): Payer: 59 | Admitting: Nurse Practitioner

## 2019-01-26 VITALS — BP 109/72 | HR 54 | Temp 98.2°F | Ht 64.0 in | Wt 220.0 lb

## 2019-01-26 DIAGNOSIS — E1169 Type 2 diabetes mellitus with other specified complication: Secondary | ICD-10-CM

## 2019-01-26 DIAGNOSIS — E119 Type 2 diabetes mellitus without complications: Secondary | ICD-10-CM

## 2019-01-26 DIAGNOSIS — I10 Essential (primary) hypertension: Secondary | ICD-10-CM

## 2019-01-26 DIAGNOSIS — E785 Hyperlipidemia, unspecified: Secondary | ICD-10-CM | POA: Diagnosis not present

## 2019-01-26 LAB — BAYER DCA HB A1C WAIVED: HB A1C (BAYER DCA - WAIVED): 5.7 % (ref <7.0)

## 2019-01-26 LAB — MICROALBUMIN, URINE WAIVED
Creatinine, Urine Waived: 10 mg/dL (ref 10–300)
Microalb, Ur Waived: 10 mg/L (ref 0–19)
Microalb/Creat Ratio: <30 mg/g (ref <30)

## 2019-01-26 NOTE — Progress Notes (Addendum)
BP 109/72   Pulse (!) 54   Temp 98.2 F (36.8 C) (Oral)   Ht 5\' 4"  (1.626 m)   Wt 220 lb (99.8 kg)   SpO2 97%   BMI 37.76 kg/m    Subjective:    Patient ID: Lance Kim, male    DOB: 05-Dec-1970, 48 y.o.   MRN: [id number]  HPI: Lance Kim is a 48 y.o. male  Chief Complaint  Patient presents with  . Diabetes    not taking Metformin for a month due to low blood sugars  . Hypertension  . Hyperlipidemia   HYPERTENSION / HYPERLIPIDEMIA Currently on Lisinopril-HCTZ and Lipitor. Satisfied with current treatment? yes Duration of hypertension: chronic BP monitoring frequency: not checking BP range:  BP medication side effects: no Duration of hyperlipidemia: chronic Cholesterol medication side effects: no Cholesterol supplements: none Medication compliance: good compliance Aspirin: yes Recent stressors: no Recurrent headaches: no Visual changes: no Palpitations: no Dyspnea: no Chest pain: no Lower extremity edema: no Dizzy/lightheaded: no   DIABETES Not taking Metformin at this time as sugars were on lower side, had discussed with provider via telephone and we decided to trial time off.  Has been focused on diet and exercise.  Previous A1C 5.9% and today 5.7%.  Continues to lose weight on review.   Hypoglycemic episodes:no Polydipsia/polyuria: no Visual disturbance: no Chest pain: no Paresthesias: no Glucose Monitoring: yes  Accucheck frequency: BID  Fasting glucose: 76-90  Post prandial:  Evening:80-101  Before meals: Taking Insulin?: no  Long acting insulin:  Short acting insulin: Blood Pressure Monitoring: not checking Retinal Examination: Not up to Date Foot Exam: Up to Date Pneumovax: Up to Date Influenza: Up to Date Aspirin: yes  Relevant past medical, surgical, family and social history reviewed and updated as indicated. Interim medical history since our last visit reviewed. Allergies and medications reviewed and updated.  Review of Systems   Constitutional: Negative for activity change, diaphoresis, fatigue and fever.  Respiratory: Negative for cough, chest tightness, shortness of breath and wheezing.   Cardiovascular: Negative for chest pain, palpitations and leg swelling.  Gastrointestinal: Negative for abdominal distention, abdominal pain, constipation, diarrhea, nausea and vomiting.  Endocrine: Negative for cold intolerance, heat intolerance, polydipsia, polyphagia and polyuria.  Musculoskeletal: Negative.   Skin: Negative.   Neurological: Negative for dizziness, syncope, weakness, light-headedness, numbness and headaches.  Psychiatric/Behavioral: Negative.     Per HPI unless specifically indicated above     Objective:    BP 109/72   Pulse (!) 54   Temp 98.2 F (36.8 C) (Oral)   Ht 5\' 4"  (1.626 m)   Wt 220 lb (99.8 kg)   SpO2 97%   BMI 37.76 kg/m   Wt Readings from Last 3 Encounters:  01/26/19 220 lb (99.8 kg)  12/20/18 225 lb (102.1 kg)  12/13/18 224 lb 6.4 oz (101.8 kg)    Physical Exam Vitals signs and nursing note reviewed.  Constitutional:      Appearance: He is well-developed.  HENT:     Head: Normocephalic and atraumatic.     Right Ear: Hearing normal. No drainage.     Left Ear: Hearing normal. No drainage.     Mouth/Throat:     Pharynx: Uvula midline.  Eyes:     General: Lids are normal.        Right eye: No discharge.        Left eye: No discharge.     Conjunctiva/sclera: Conjunctivae normal.     Pupils: Pupils are  equal, round, and reactive to light.  Neck:     Musculoskeletal: Normal range of motion and neck supple.     Thyroid: No thyromegaly.     Vascular: No carotid bruit or JVD.     Trachea: Trachea normal.  Cardiovascular:     Rate and Rhythm: Normal rate and regular rhythm.     Heart sounds: Normal heart sounds, S1 normal and S2 normal. No murmur. No gallop.   Pulmonary:     Effort: Pulmonary effort is normal.     Breath sounds: Normal breath sounds.  Abdominal:     General:  Bowel sounds are normal.     Palpations: Abdomen is soft. There is no hepatomegaly or splenomegaly.  Musculoskeletal: Normal range of motion.     Right lower leg: No edema.     Left lower leg: No edema.  Skin:    General: Skin is warm and dry.     Capillary Refill: Capillary refill takes less than 2 seconds.     Findings: No rash.  Neurological:     Mental Status: He is alert and oriented to person, place, and time.     Deep Tendon Reflexes: Reflexes are normal and symmetric.  Psychiatric:        Mood and Affect: Mood normal.        Behavior: Behavior normal.        Thought Content: Thought content normal.        Judgment: Judgment normal.     Results for orders placed or performed in visit on 01/26/19  Bayer DCA Hb A1c Waived  Result Value Ref Range   HB A1C (BAYER DCA - WAIVED) 5.7 <7.0 %  Microalbumin, Urine Waived  Result Value Ref Range   Microalb, Ur Waived 10 0 - 19 mg/L   Creatinine, Urine Waived 10 10 - 300 mg/dL   Microalb/Creat Ratio <30 <30 mg/g      Assessment & Plan:   Problem List Items Addressed This Visit      Cardiovascular and Mediastinum   Benign essential HTN    Chronic, ongoing.  BP below goal. Continue current medication regimen.  Labs today.        Endocrine   Type 2 diabetes mellitus without complication, without long-term current use of insulin (HCC) - Primary (Chronic)    Chronic, ongoing.  Is losing weight and focused on diet.  A1C today 5.7% with diet and no medication. Urine Micro ALB 10 and A:C <30. Praised patient for success.  Will return in 3 months for follow-up.      Relevant Orders   Bayer DCA Hb A1c Waived (Completed)   Microalbumin, Urine Waived (Completed)   Hyperlipidemia associated with type 2 diabetes mellitus (HCC)    Chronic, ongoing.  Continue current medication regimen.  Labs today.      Relevant Orders   Comprehensive metabolic panel   Lipid Panel w/o Chol/HDL Ratio      Time: 20 minutes, >50% spent counseling  on diet   Follow up plan: Return in about 3 months (around 04/28/2019) for T2DM, HTN/HLD.

## 2019-01-26 NOTE — Assessment & Plan Note (Addendum)
Chronic, ongoing.  Is losing weight and focused on diet.  A1C today 5.7% with diet and no medication. Urine Micro ALB 10 and A:C <30. Praised patient for success.  Will return in 3 months for follow-up.

## 2019-01-26 NOTE — Patient Instructions (Signed)
Diabetes Basics    Diabetes (diabetes mellitus) is a long-term (chronic) disease. It occurs when the body does not properly use sugar (glucose) that is released from food after you eat.  Diabetes may be caused by one or both of these problems:  · Your pancreas does not make enough of a hormone called insulin.  · Your body does not react in a normal way to insulin that it makes.  Insulin lets sugars (glucose) go into cells in your body. This gives you energy. If you have diabetes, sugars cannot get into cells. This causes high blood sugar (hyperglycemia).  Follow these instructions at home:  How is diabetes treated?  You may need to take insulin or other diabetes medicines daily to keep your blood sugar in balance. Take your diabetes medicines every day as told by your doctor. List your diabetes medicines here:  Diabetes medicines  · Name of medicine: ______________________________  ? Amount (dose): _______________ Time (a.m./p.m.): _______________ Notes: ___________________________________  · Name of medicine: ______________________________  ? Amount (dose): _______________ Time (a.m./p.m.): _______________ Notes: ___________________________________  · Name of medicine: ______________________________  ? Amount (dose): _______________ Time (a.m./p.m.): _______________ Notes: ___________________________________  If you use insulin, you will learn how to give yourself insulin by injection. You may need to adjust the amount based on the food that you eat. List the types of insulin you use here:  Insulin  · Insulin type: ______________________________  ? Amount (dose): _______________ Time (a.m./p.m.): _______________ Notes: ___________________________________  · Insulin type: ______________________________  ? Amount (dose): _______________ Time (a.m./p.m.): _______________ Notes: ___________________________________  · Insulin type: ______________________________  ? Amount (dose): _______________ Time (a.m./p.m.):  _______________ Notes: ___________________________________  · Insulin type: ______________________________  ? Amount (dose): _______________ Time (a.m./p.m.): _______________ Notes: ___________________________________  · Insulin type: ______________________________  ? Amount (dose): _______________ Time (a.m./p.m.): _______________ Notes: ___________________________________  How do I manage my blood sugar?    Check your blood sugar levels using a blood glucose monitor as directed by your doctor.  Your doctor will set treatment goals for you. Generally, you should have these blood sugar levels:  · Before meals (preprandial): 80-130 mg/dL (4.4-7.2 mmol/L).  · After meals (postprandial): below 180 mg/dL (10 mmol/L).  · A1c level: less than 7%.  Write down the times that you will check your blood sugar levels:  Blood sugar checks  · Time: _______________ Notes: ___________________________________  · Time: _______________ Notes: ___________________________________  · Time: _______________ Notes: ___________________________________  · Time: _______________ Notes: ___________________________________  · Time: _______________ Notes: ___________________________________  · Time: _______________ Notes: ___________________________________    What do I need to know about low blood sugar?  Low blood sugar is called hypoglycemia. This is when blood sugar is at or below 70 mg/dL (3.9 mmol/L). Symptoms may include:  · Feeling:  ? Hungry.  ? Worried or nervous (anxious).  ? Sweaty and clammy.  ? Confused.  ? Dizzy.  ? Sleepy.  ? Sick to your stomach (nauseous).  · Having:  ? A fast heartbeat.  ? A headache.  ? A change in your vision.  ? Tingling or no feeling (numbness) around the mouth, lips, or tongue.  ? Jerky movements that you cannot control (seizure).  · Having trouble with:  ? Moving (coordination).  ? Sleeping.  ? Passing out (fainting).  ? Getting upset easily (irritability).  Treating low blood sugar  To treat low blood  sugar, eat or drink something sugary right away. If you can think clearly and swallow safely, follow the 15:15   rule:  · Take 15 grams of a fast-acting carb (carbohydrate). Talk with your doctor about how much you should take.  · Some fast-acting carbs are:  ? Sugar tablets (glucose pills). Take 3-4 glucose pills.  ? 6-8 pieces of hard candy.  ? 4-6 oz (120-150 mL) of fruit juice.  ? 4-6 oz (120-150 mL) of regular (not diet) soda.  ? 1 Tbsp (15 mL) honey or sugar.  · Check your blood sugar 15 minutes after you take the carb.  · If your blood sugar is still at or below 70 mg/dL (3.9 mmol/L), take 15 grams of a carb again.  · If your blood sugar does not go above 70 mg/dL (3.9 mmol/L) after 3 tries, get help right away.  · After your blood sugar goes back to normal, eat a meal or a snack within 1 hour.  Treating very low blood sugar  If your blood sugar is at or below 54 mg/dL (3 mmol/L), you have very low blood sugar (severe hypoglycemia). This is an emergency. Do not wait to see if the symptoms will go away. Get medical help right away. Call your local emergency services (911 in the [place] Do not drive yourself to the hospital.  Questions to ask your health care provider  · Do I need to meet with a diabetes educator?  · What equipment will I need to care for myself at home?  · What diabetes medicines do I need? When should I take them?  · How often do I need to check my blood sugar?  · What number can I call if I have questions?  · When is my next doctor's visit?  · Where can I find a support group for people with diabetes?  Where to find more information  · American Diabetes Association: www.diabetes.org  · American Association of Diabetes Educators: www.diabeteseducator.org/patient-resources  Contact a doctor if:  · Your blood sugar is at or above 240 mg/dL (13.3 mmol/L) for 2 days in a row.  · You have been sick or have had a fever for 2 days or more, and you are not getting better.  · You have any of these  problems for more than 6 hours:  ? You cannot eat or drink.  ? You feel sick to your stomach (nauseous).  ? You throw up (vomit).  ? You have watery poop (diarrhea).  Get help right away if:  · Your blood sugar is lower than 54 mg/dL (3 mmol/L).  · You get confused.  · You have trouble:  ? Thinking clearly.  ? Breathing.  Summary  · Diabetes (diabetes mellitus) is a long-term (chronic) disease. It occurs when the body does not properly use sugar (glucose) that is released from food after digestion.  · Take insulin and diabetes medicines as told.  · Check your blood sugar every day, as often as told.  · Keep all follow-up visits as told by your doctor. This is important.  This information is not intended to replace advice given to you by your health care provider. Make sure you discuss any questions you have with your health care provider.  Document Released: 12/04/2017 Document Revised: 02/22/2018 Document Reviewed: 12/04/2017  Elsevier Interactive Patient Education © 2019 [employer]

## 2019-01-26 NOTE — Assessment & Plan Note (Signed)
Chronic, ongoing.  Continue current medication regimen.  Labs today.

## 2019-01-26 NOTE — Assessment & Plan Note (Signed)
Chronic, ongoing.  BP below goal. Continue current medication regimen.  Labs today.

## 2019-01-27 LAB — COMPREHENSIVE METABOLIC PANEL
ALT: 16 IU/L (ref 0–44)
AST: 20 IU/L (ref 0–40)
Albumin/Globulin Ratio: 1.4 (ref 1.2–2.2)
Albumin: 3.8 g/dL — ABNORMAL LOW (ref 4.0–5.0)
Alkaline Phosphatase: 67 IU/L (ref 39–117)
BUN/Creatinine Ratio: 12 (ref 9–20)
BUN: 13 mg/dL (ref 6–24)
Bilirubin Total: 0.3 mg/dL (ref 0.0–1.2)
CO2: 24 mmol/L (ref 20–29)
Calcium: 9.3 mg/dL (ref 8.7–10.2)
Chloride: 101 mmol/L (ref 96–106)
Creatinine, Ser: 1.06 mg/dL (ref 0.76–1.27)
GFR calc Af Amer: 95 mL/min/{1.73_m2} (ref >59)
GFR calc non Af Amer: 82 mL/min/{1.73_m2} (ref >59)
Globulin, Total: 2.8 g/dL (ref 1.5–4.5)
Glucose: 89 mg/dL (ref 65–99)
Potassium: 4.6 mmol/L (ref 3.5–5.2)
Sodium: 136 mmol/L (ref 134–144)
Total Protein: 6.6 g/dL (ref 6.0–8.5)

## 2019-01-27 LAB — LIPID PANEL W/O CHOL/HDL RATIO
Cholesterol, Total: 103 mg/dL (ref 100–199)
HDL: 36 mg/dL — ABNORMAL LOW (ref >39)
LDL Calculated: 54 mg/dL (ref 0–99)
Triglycerides: 66 mg/dL (ref 0–149)
VLDL Cholesterol Cal: 13 mg/dL (ref 5–40)

## 2019-02-02 ENCOUNTER — Other Ambulatory Visit: Payer: Self-pay | Admitting: Family Medicine

## 2019-02-02 ENCOUNTER — Other Ambulatory Visit: Payer: Self-pay | Admitting: Nurse Practitioner

## 2019-03-15 ENCOUNTER — Other Ambulatory Visit: Payer: Self-pay | Admitting: Nurse Practitioner

## 2019-03-15 NOTE — Telephone Encounter (Signed)
Would Lance Kim like to continue this rx?

## 2019-03-21 ENCOUNTER — Other Ambulatory Visit: Payer: Self-pay | Admitting: Nurse Practitioner

## 2019-03-21 NOTE — Telephone Encounter (Signed)
Requested Prescriptions  Pending Prescriptions Disp Refills  . cetirizine (ZYRTEC) 10 MG tablet [Pharmacy Med Name: CETIRIZINE 10MG  TABLETS] 90 tablet 0    Sig: TAKE 1 TABLET BY MOUTH EVERY DAY     Ear, Nose, and Throat:  Antihistamines Passed - 03/21/2019 12:48 PM      Passed - Valid encounter within last 12 months    Recent Outpatient Visits          1 month ago Type 2 diabetes mellitus without complication, without long-term current use of insulin ([place])   [place], [place] T, NP   3 months ago Type 2 diabetes mellitus without complication, without long-term current use of insulin ([place])   [place], [place] T, NP   3 months ago Piriformis syndrome of left side   [employer], [place] T, NP   5 months ago Type 2 diabetes mellitus without complication, without long-term current use of insulin ([place])   [place] Cannady, Jolene T, NP   7 months ago Type 2 diabetes mellitus without complication, without long-term current use of insulin ([place])   [place], Barbaraann Faster, NP      Future Appointments            In 1 month Cannady, Barbaraann Faster, NP [facility]

## 2019-04-04 ENCOUNTER — Other Ambulatory Visit: Payer: Self-pay

## 2019-04-04 MED ORDER — CETIRIZINE HCL 10 MG PO TABS: 10.0000 mg | ORAL_TABLET | Freq: Every day | ORAL | 3 refills | Status: DC

## 2019-04-19 ENCOUNTER — Other Ambulatory Visit: Payer: Self-pay | Admitting: Nurse Practitioner

## 2019-04-19 NOTE — Telephone Encounter (Signed)
Requested medications are due for refill today?  Yes  Requested medications are on the active medication list?  Yes  Last refill - 03/15/2019  Future visit scheduled?  Yes- 04/27/2019  Notes to clinic   Requested Prescriptions  Pending Prescriptions Disp Refills   ondansetron (ZOFRAN-ODT) 4 MG disintegrating tablet [Pharmacy Med Name: ONDANSETRON ODT 4MG  TABLETS] 20 tablet 2    Sig: DISSOLVE 1 TABLET ON THE TONGUE EVERY 8 HOURS AS NEEDED FOR NAUSEA     Not Delegated - Gastroenterology: Antiemetics Failed - 04/19/2019  3:38 AM      Failed - This refill cannot be delegated      Passed - Valid encounter within last 6 months    Recent Outpatient Visits          2 months ago Type 2 diabetes mellitus without complication, without long-term current use of insulin ([place])   [place], Jolene T, NP   4 months ago Type 2 diabetes mellitus without complication, without long-term current use of insulin ([place])   [place], [place]vin T, NP   4 months ago Piriformis syndrome of left side   [employer], [place] T, NP   6 months ago Type 2 diabetes mellitus without complication, without long-term current use of insulin ([place])   [employer] Cannady, Jolene T, NP   8 months ago Type 2 diabetes mellitus without complication, without long-term current use of insulin ([place])   [place], Barbaraann Faster, NP      Future Appointments            In 1 week Cannady, Barbaraann Faster, NP [facility]

## 2019-04-27 ENCOUNTER — Other Ambulatory Visit: Payer: Self-pay

## 2019-04-27 ENCOUNTER — Ambulatory Visit (INDEPENDENT_AMBULATORY_CARE_PROVIDER_SITE_OTHER): Payer: 59 | Admitting: Nurse Practitioner

## 2019-04-27 ENCOUNTER — Encounter: Payer: Self-pay | Admitting: Nurse Practitioner

## 2019-04-27 VITALS — BP 102/67 | HR 67 | Temp 98.9°F | Wt 206.4 lb

## 2019-04-27 DIAGNOSIS — I10 Essential (primary) hypertension: Secondary | ICD-10-CM

## 2019-04-27 DIAGNOSIS — E1169 Type 2 diabetes mellitus with other specified complication: Secondary | ICD-10-CM

## 2019-04-27 DIAGNOSIS — E785 Hyperlipidemia, unspecified: Secondary | ICD-10-CM

## 2019-04-27 DIAGNOSIS — E119 Type 2 diabetes mellitus without complications: Secondary | ICD-10-CM

## 2019-04-27 LAB — BAYER DCA HB A1C WAIVED: HB A1C (BAYER DCA - WAIVED): 5.5 % (ref <7.0)

## 2019-04-27 MED ORDER — LISINOPRIL 20 MG PO TABS: 20.0000 mg | ORAL_TABLET | Freq: Every day | ORAL | 3 refills | Status: DC

## 2019-04-27 NOTE — Assessment & Plan Note (Signed)
Chronic, stable with weight loss BP has been trending downwards.  Will trial off HCTZ.  Continue Lisinopril and Amlodipine.  Recommend checking BP at home three mornings a week and notify provider if readings >130/90.  CMP today.  Praised him for 50 pounds of weight loss in past 6 months.

## 2019-04-27 NOTE — Assessment & Plan Note (Signed)
Chronic, ongoing.  Continue current medication regimen and adjust as needed.  Lipid panel and CMP today.    

## 2019-04-27 NOTE — Assessment & Plan Note (Signed)
Chronic, stable with A1C today 5.5% remaining off Metformin.  Praised him for 50 pounds of weight loss in past 6 months.  Will continue off medication at this time and continue diet focus.  Return in 3 months.

## 2019-04-27 NOTE — Progress Notes (Signed)
BP 102/67   Pulse 67   Temp 98.9 F (37.2 C) (Oral)   Wt 206 lb 6.4 oz (93.6 kg)   SpO2 97%   BMI 35.43 kg/m    Subjective:    Patient ID: Lance Kim, male    DOB: [birth year], 48 y.o.   MRN: [id number]  HPI: Lance Kim is a 48 y.o. male  Chief Complaint  Patient presents with  . Diabetes  . Hyperlipidemia  . Hypertension   DIABETES Last visit A1C 5.7% and Metformin discontinued.  Today A1C 5.5%.  Has lost almost 50 pounds since December and reports "feeling so much better, my knees do not hurt and I don't have to wear my back brace".  Is interested in having nutrition consult again to discuss diet choices to maintain weight loss and gain muscle mass with ongoing weight loss.   Hypoglycemic episodes:no Polydipsia/polyuria: no Visual disturbance: no Chest pain: no Paresthesias: no Glucose Monitoring: no  Accucheck frequency: Daily  Fasting glucose: 90-130  Post prandial:  Evening:  Before meals: Taking Insulin?: no  Long acting insulin:  Short acting insulin: Blood Pressure Monitoring: not checking Retinal Examination: Not up to Date Foot Exam: Up to Date Pneumovax: Up to Date Influenza: Up to Date Aspirin: yes   HYPERTENSION / HYPERLIPIDEMIA Continues on Lisinopril-HCTZ, Amlodipine, and Atorvastatin, ASA.  Currently smoking 1/2 PPD, does not wish to quit.   Satisfied with current treatment? yes Duration of hypertension: chronic BP monitoring frequency: not checking BP range:  BP medication side effects: no Duration of hyperlipidemia: chronic Cholesterol medication side effects: no Cholesterol supplements: none Medication compliance: good compliance Aspirin: yes Recent stressors: no Recurrent headaches: no Visual changes: no Palpitations: no Dyspnea: no Chest pain: no Lower extremity edema: no Dizzy/lightheaded: no  Relevant past medical, surgical, family and social history reviewed and updated as indicated. Interim medical history since our  last visit reviewed. Allergies and medications reviewed and updated.  Review of Systems  Constitutional: Negative for activity change, diaphoresis, fatigue and fever.  Respiratory: Negative for cough, chest tightness, shortness of breath and wheezing.   Cardiovascular: Negative for chest pain, palpitations and leg swelling.  Gastrointestinal: Negative for abdominal distention, abdominal pain, constipation, diarrhea, nausea and vomiting.  Endocrine: Negative for cold intolerance, heat intolerance, polydipsia, polyphagia and polyuria.  Musculoskeletal: Negative.   Neurological: Negative for dizziness, syncope, weakness, light-headedness, numbness and headaches.  Psychiatric/Behavioral: Negative.     Per HPI unless specifically indicated above     Objective:    BP 102/67   Pulse 67   Temp 98.9 F (37.2 C) (Oral)   Wt 206 lb 6.4 oz (93.6 kg)   SpO2 97%   BMI 35.43 kg/m   Wt Readings from Last 3 Encounters:  04/27/19 206 lb 6.4 oz (93.6 kg)  01/26/19 220 lb (99.8 kg)  12/20/18 225 lb (102.1 kg)    Physical Exam Vitals signs and nursing note reviewed.  Constitutional:      General: He is awake. He is not in acute distress.    Appearance: He is well-developed and overweight. He is not ill-appearing.  HENT:     Head: Normocephalic and atraumatic.     Right Ear: Hearing normal. No drainage.     Left Ear: Hearing normal. No drainage.  Eyes:     General: Lids are normal.        Right eye: No discharge.        Left eye: No discharge.     Conjunctiva/sclera: Conjunctivae normal.  Pupils: Pupils are equal, round, and reactive to light.  Neck:     Musculoskeletal: Normal range of motion and neck supple.     Thyroid: No thyromegaly.     Vascular: No carotid bruit.  Cardiovascular:     Rate and Rhythm: Normal rate and regular rhythm.     Heart sounds: Normal heart sounds, S1 normal and S2 normal. No murmur. No gallop.   Pulmonary:     Effort: Pulmonary effort is normal. No  accessory muscle usage or respiratory distress.     Breath sounds: Normal breath sounds.  Abdominal:     General: Bowel sounds are normal.     Palpations: Abdomen is soft.  Musculoskeletal: Normal range of motion.     Right lower leg: No edema.     Left lower leg: No edema.  Skin:    General: Skin is warm and dry.     Capillary Refill: Capillary refill takes less than 2 seconds.  Neurological:     Mental Status: He is alert and oriented to person, place, and time.     Deep Tendon Reflexes: Reflexes are normal and symmetric.  Psychiatric:        Mood and Affect: Mood normal.        Behavior: Behavior normal. Behavior is cooperative.        Thought Content: Thought content normal.        Judgment: Judgment normal.    Diabetic Foot Exam - Simple   Simple Foot Form Visual Inspection See comments: Yes Sensation Testing Intact to touch and monofilament testing bilaterally: Yes Pulse Check Posterior Tibialis and Dorsalis pulse intact bilaterally: Yes Comments Xerosis bilateral feet.     Results for orders placed or performed in visit on 01/26/19  Bayer DCA Hb A1c Waived  Result Value Ref Range   HB A1C (BAYER DCA - WAIVED) 5.7 <7.0 %  Microalbumin, Urine Waived  Result Value Ref Range   Microalb, Ur Waived 10 0 - 19 mg/L   Creatinine, Urine Waived 10 10 - 300 mg/dL   Microalb/Creat Ratio <30 <30 mg/g  Comprehensive metabolic panel  Result Value Ref Range   Glucose 89 65 - 99 mg/dL   BUN 13 6 - 24 mg/dL   Creatinine, Ser 1.06 0.76 - 1.27 mg/dL   GFR calc non Af Amer 82 >59 mL/min/1.73   GFR calc Af Amer 95 >59 mL/min/1.73   BUN/Creatinine Ratio 12 9 - 20   Sodium 136 134 - 144 mmol/L   Potassium 4.6 3.5 - 5.2 mmol/L   Chloride 101 96 - 106 mmol/L   CO2 24 20 - 29 mmol/L   Calcium 9.3 8.7 - 10.2 mg/dL   Total Protein 6.6 6.0 - 8.5 g/dL   Albumin 3.8 (L) 4.0 - 5.0 g/dL   Globulin, Total 2.8 1.5 - 4.5 g/dL   Albumin/Globulin Ratio 1.4 1.2 - 2.2   Bilirubin Total 0.3  0.0 - 1.2 mg/dL   Alkaline Phosphatase 67 39 - 117 IU/L   AST 20 0 - 40 IU/L   ALT 16 0 - 44 IU/L  Lipid Panel w/o Chol/HDL Ratio  Result Value Ref Range   Cholesterol, Total 103 100 - 199 mg/dL   Triglycerides 66 0 - 149 mg/dL   HDL 36 (L) >39 mg/dL   VLDL Cholesterol Cal 13 5 - 40 mg/dL   LDL Calculated 54 0 - 99 mg/dL      Assessment & Plan:   Problem List Items Addressed This Visit  Cardiovascular and Mediastinum   Benign essential HTN    Chronic, stable with weight loss BP has been trending downwards.  Will trial off HCTZ.  Continue Lisinopril and Amlodipine.  Recommend checking BP at home three mornings a week and notify provider if readings >130/90.  CMP today.  Praised him for 50 pounds of weight loss in past 6 months.      Relevant Medications   lisinopril (ZESTRIL) 20 MG tablet     Endocrine   Type 2 diabetes mellitus without complication, without long-term current use of insulin (HCC) - Primary (Chronic)    Chronic, stable with A1C today 5.5% remaining off Metformin.  Praised him for 50 pounds of weight loss in past 6 months.  Will continue off medication at this time and continue diet focus.  Return in 3 months.      Relevant Medications   lisinopril (ZESTRIL) 20 MG tablet   Other Relevant Orders   Bayer DCA Hb A1c Waived   Amb ref to Medical Nutrition Therapy-MNT   Hyperlipidemia associated with type 2 diabetes mellitus (HCC)    Chronic, ongoing.  Continue current medication regimen and adjust as needed.  Lipid panel and CMP today.         Relevant Medications   lisinopril (ZESTRIL) 20 MG tablet   Other Relevant Orders   Lipid Panel w/o Chol/HDL Ratio   Comprehensive metabolic panel       Follow up plan: Return in about 3 months (around 07/28/2019) for T2DM, HTN/HLD.

## 2019-04-27 NOTE — Patient Instructions (Signed)
STOP TAKING LISINOPRIL-HCTZ and take only Lisinopril, sent in to pharmacy.   Carbohydrate Counting for Diabetes Mellitus, Adult  Carbohydrate counting is a method of keeping track of how many carbohydrates you eat. Eating carbohydrates naturally increases the amount of sugar (glucose) in the blood. Counting how many carbohydrates you eat helps keep your blood glucose within normal limits, which helps you manage your diabetes (diabetes mellitus). It is important to know how many carbohydrates you can safely have in each meal. This is different for every person. A diet and nutrition specialist (registered dietitian) can help you make a meal plan and calculate how many carbohydrates you should have at each meal and snack. Carbohydrates are found in the following foods:  Grains, such as breads and cereals.  Dried beans and soy products.  Starchy vegetables, such as potatoes, peas, and corn.  Fruit and fruit juices.  Milk and yogurt.  Sweets and snack foods, such as cake, cookies, candy, chips, and soft drinks. How do I count carbohydrates? There are two ways to count carbohydrates in food. You can use either of the methods or a combination of both. Reading "Nutrition Facts" on packaged food The "Nutrition Facts" list is included on the labels of almost all packaged foods and beverages in the [place] It includes:  The serving size.  Information about nutrients in each serving, including the grams (g) of carbohydrate per serving. To use the "Nutrition Facts":  Decide how many servings you will have.  Multiply the number of servings by the number of carbohydrates per serving.  The resulting number is the total amount of carbohydrates that you will be having. Learning standard serving sizes of other foods When you eat carbohydrate foods that are not packaged or do not include "Nutrition Facts" on the label, you need to measure the servings in order to count the amount of carbohydrates:   Measure the foods that you will eat with a food scale or measuring cup, if needed.  Decide how many standard-size servings you will eat.  Multiply the number of servings by 15. Most carbohydrate-rich foods have about 15 g of carbohydrates per serving. ? For example, if you eat 8 oz (170 g) of strawberries, you will have eaten 2 servings and 30 g of carbohydrates (2 servings x 15 g = 30 g).  For foods that have more than one food mixed, such as soups and casseroles, you must count the carbohydrates in each food that is included. The following list contains standard serving sizes of common carbohydrate-rich foods. Each of these servings has about 15 g of carbohydrates:   hamburger bun or  English muffin.   oz (15 mL) syrup.   oz (14 g) jelly.  1 slice of bread.  1 six-inch tortilla.  3 oz (85 g) cooked rice or pasta.  4 oz (113 g) cooked dried beans.  4 oz (113 g) starchy vegetable, such as peas, corn, or potatoes.  4 oz (113 g) hot cereal.  4 oz (113 g) mashed potatoes or  of a large baked potato.  4 oz (113 g) canned or frozen fruit.  4 oz (120 mL) fruit juice.  4-6 crackers.  6 chicken nuggets.  6 oz (170 g) unsweetened dry cereal.  6 oz (170 g) plain fat-free yogurt or yogurt sweetened with artificial sweeteners.  8 oz (240 mL) milk.  8 oz (170 g) fresh fruit or one small piece of fruit.  24 oz (680 g) popped popcorn. Example of carbohydrate counting Sample  meal  3 oz (85 g) chicken breast.  6 oz (170 g) brown rice.  4 oz (113 g) corn.  8 oz (240 mL) milk.  8 oz (170 g) strawberries with sugar-free whipped topping. Carbohydrate calculation 1. Identify the foods that contain carbohydrates: ? Rice. ? Corn. ? Milk. ? Strawberries. 2. Calculate how many servings you have of each food: ? 2 servings rice. ? 1 serving corn. ? 1 serving milk. ? 1 serving strawberries. 3. Multiply each number of servings by 15 g: ? 2 servings rice x 15 g = 30 g. ?  1 serving corn x 15 g = 15 g. ? 1 serving milk x 15 g = 15 g. ? 1 serving strawberries x 15 g = 15 g. 4. Add together all of the amounts to find the total grams of carbohydrates eaten: ? 30 g + 15 g + 15 g + 15 g = 75 g of carbohydrates total. Summary  Carbohydrate counting is a method of keeping track of how many carbohydrates you eat.  Eating carbohydrates naturally increases the amount of sugar (glucose) in the blood.  Counting how many carbohydrates you eat helps keep your blood glucose within normal limits, which helps you manage your diabetes.  A diet and nutrition specialist (registered dietitian) can help you make a meal plan and calculate how many carbohydrates you should have at each meal and snack. This information is not intended to replace advice given to you by your health care provider. Make sure you discuss any questions you have with your health care provider. Document Released: 09/01/2005 Document Revised: 03/26/2017 Document Reviewed: 02/13/2016 Elsevier Patient Education  2020 [employer].

## 2019-04-28 LAB — COMPREHENSIVE METABOLIC PANEL
ALT: 20 IU/L (ref 0–44)
AST: 23 IU/L (ref 0–40)
Albumin/Globulin Ratio: 1.6 (ref 1.2–2.2)
Albumin: 4.1 g/dL (ref 4.0–5.0)
Alkaline Phosphatase: 70 IU/L (ref 39–117)
BUN/Creatinine Ratio: 10 (ref 9–20)
BUN: 11 mg/dL (ref 6–24)
Bilirubin Total: 0.3 mg/dL (ref 0.0–1.2)
CO2: 26 mmol/L (ref 20–29)
Calcium: 9.3 mg/dL (ref 8.7–10.2)
Chloride: 101 mmol/L (ref 96–106)
Creatinine, Ser: 1.05 mg/dL (ref 0.76–1.27)
GFR calc Af Amer: 96 mL/min/{1.73_m2} (ref >59)
GFR calc non Af Amer: 83 mL/min/{1.73_m2} (ref >59)
Globulin, Total: 2.6 g/dL (ref 1.5–4.5)
Glucose: 85 mg/dL (ref 65–99)
Potassium: 4.6 mmol/L (ref 3.5–5.2)
Sodium: 140 mmol/L (ref 134–144)
Total Protein: 6.7 g/dL (ref 6.0–8.5)

## 2019-04-28 LAB — LIPID PANEL W/O CHOL/HDL RATIO
Cholesterol, Total: 103 mg/dL (ref 100–199)
HDL: 40 mg/dL (ref >39)
LDL Calculated: 48 mg/dL (ref 0–99)
Triglycerides: 74 mg/dL (ref 0–149)
VLDL Cholesterol Cal: 15 mg/dL (ref 5–40)

## 2019-04-28 NOTE — Progress Notes (Signed)
Normal test results noted.  Please call patient and make them aware of normal results and will continue to monitor at regular visits.  Have a great day.  Look forward to seeing you at your next visit.

## 2019-05-28 ENCOUNTER — Other Ambulatory Visit: Payer: Self-pay | Admitting: Nurse Practitioner

## 2019-05-28 NOTE — Telephone Encounter (Signed)
Forwarding medication refill request to PCP for review. 

## 2019-06-08 ENCOUNTER — Encounter: Payer: Self-pay | Admitting: Nurse Practitioner

## 2019-06-08 ENCOUNTER — Telehealth: Payer: Self-pay

## 2019-06-08 NOTE — Telephone Encounter (Signed)
Letter mailed out to patient's address.

## 2019-06-08 NOTE — Telephone Encounter (Signed)
Copied from [place] [id number]. Topic: General - Other >> Jun 08, 2019 11:01 AM Yvette Rack wrote: Reason for CRM: Pt stated his insurance recently changed and he needs a letter stating that he has a pre-existing condition (diabetes) so he can get his test strips covered. Cb# [phone]   Routing to provider for letter.

## 2019-06-08 NOTE — Telephone Encounter (Signed)
I have print letter if we can send out please.  Thanks.

## 2019-06-10 ENCOUNTER — Telehealth: Payer: Self-pay | Admitting: Nurse Practitioner

## 2019-06-10 ENCOUNTER — Other Ambulatory Visit: Payer: Self-pay | Admitting: Nurse Practitioner

## 2019-06-10 MED ORDER — LISINOPRIL-HYDROCHLOROTHIAZIDE 20-12.5 MG PO TABS: 1.0000 | ORAL_TABLET | Freq: Every day | ORAL | 2 refills | Status: DC

## 2019-06-10 NOTE — Telephone Encounter (Signed)
Routing to provider to advise.  

## 2019-06-10 NOTE — Telephone Encounter (Signed)
Spoke to patient via telephone.  He reports he has noticed increased fatigue since stopping HCTZ.  Will restart this in combo pill as before Lisinopril 20-HCTZ 12.5 MG.  He is to check BP daily and notify provider if any hypotension.  He stated appreciation for call.

## 2019-06-10 NOTE — Telephone Encounter (Signed)
Pt states ever since he started taking the lisinopril (ZESTRIL) 20 MG tablet w/out the   hydrochlorothiazide , he has been feeling fatigued and sluggish.  He has had to come home in the afternoon to take a nap. Pt would like to know anything Jolene could advise.

## 2019-06-13 ENCOUNTER — Other Ambulatory Visit: Payer: Self-pay | Admitting: Unknown Physician Specialty

## 2019-06-14 NOTE — Telephone Encounter (Signed)
Upcoming appointment 08-01-2019

## 2019-06-17 ENCOUNTER — Telehealth: Payer: Self-pay

## 2019-06-17 NOTE — Telephone Encounter (Signed)
New script faxed   Copied from [place] [phone]. Topic: General - Other >> Jun 17, 2019  2:30 PM Keene Breath wrote: Reason for CRM: Patient called to ask the nurse to call him regarding his diabetic meter.  Patient has new insurance and is concerned if it is covered.  CB# [phone]

## 2019-07-11 ENCOUNTER — Telehealth: Payer: Self-pay | Admitting: Nurse Practitioner

## 2019-07-11 ENCOUNTER — Encounter: Payer: Self-pay | Admitting: Family Medicine

## 2019-07-11 ENCOUNTER — Other Ambulatory Visit: Payer: Self-pay

## 2019-07-11 ENCOUNTER — Ambulatory Visit (INDEPENDENT_AMBULATORY_CARE_PROVIDER_SITE_OTHER): Payer: BC Managed Care – PPO | Admitting: Family Medicine

## 2019-07-11 DIAGNOSIS — J069 Acute upper respiratory infection, unspecified: Secondary | ICD-10-CM | POA: Diagnosis not present

## 2019-07-11 MED ORDER — BENZONATATE 200 MG PO CAPS: 200.0000 mg | ORAL_CAPSULE | Freq: Two times a day (BID) | ORAL | 0 refills | Status: DC | PRN

## 2019-07-11 NOTE — Telephone Encounter (Signed)
Pt says that he has cough and would like to know if provider would send in something for a cough to the pharmacy for him?     Pharmacy:  [facility] [address] [place] (Phone) [phone] (Fax)     CB: [phone]

## 2019-07-11 NOTE — Progress Notes (Signed)
There were no vitals taken for this visit.   Subjective:    Patient ID: Lance Kim, male    DOB: [birth year], 48 y.o.   MRN: [id number]  HPI: Lance Kim is a 48 y.o. male  Chief Complaint  Patient presents with  . Cough    Runny nose, loss of appetite, no fever started last week, has been taking otc meds   UPPER RESPIRATORY TRACT INFECTION Duration: last week- starting to feel better Worst symptom: cough and headache Fever: no Cough: yes Shortness of breath: yes Wheezing: yes Chest pain: no Chest tightness: no Chest congestion: yes Nasal congestion: yes Runny nose: yes Post nasal drip: yes Sneezing: yes Sore throat: yes Swollen glands: no Sinus pressure: yes Headache: yes Face pain: no Toothache: no Ear pain: no  Ear pressure: no  Eyes red/itching:yes Eye drainage/crusting: yes  Vomiting: no Rash: no Fatigue: yes Sick contacts: no Strep contacts: no  Context: better Recurrent sinusitis: no Relief with OTC cold/cough medications: yes  Treatments attempted: coracidin and ibuprofen   Relevant past medical, surgical, family and social history reviewed and updated as indicated. Interim medical history since our last visit reviewed. Allergies and medications reviewed and updated.  Review of Systems  Constitutional: Positive for fatigue. Negative for activity change, appetite change, chills, diaphoresis, fever and unexpected weight change.  HENT: Positive for congestion, postnasal drip, rhinorrhea and sinus pressure. Negative for dental problem, drooling, ear discharge, ear pain, facial swelling, hearing loss, mouth sores, nosebleeds, sinus pain, sneezing, sore throat, tinnitus, trouble swallowing and voice change.   Respiratory: Positive for cough, shortness of breath and wheezing. Negative for apnea, choking, chest tightness and stridor.   Cardiovascular: Negative.   Gastrointestinal: Negative.   Psychiatric/Behavioral: Negative.     Per HPI unless  specifically indicated above     Objective:    There were no vitals taken for this visit.  Wt Readings from Last 3 Encounters:  04/27/19 206 lb 6.4 oz (93.6 kg)  01/26/19 220 lb (99.8 kg)  12/20/18 225 lb (102.1 kg)    Physical Exam Vitals signs and nursing note reviewed.  Constitutional:      General: He is not in acute distress.    Appearance: Normal appearance. He is not ill-appearing, toxic-appearing or diaphoretic.  HENT:     Head: Normocephalic and atraumatic.     Right Ear: External ear normal.     Left Ear: External ear normal.     Nose: Nose normal.     Mouth/Throat:     Mouth: Mucous membranes are moist.     Pharynx: Oropharynx is clear.  Eyes:     General: No scleral icterus.       Right eye: No discharge.        Left eye: No discharge.     Conjunctiva/sclera: Conjunctivae normal.     Pupils: Pupils are equal, round, and reactive to light.  Neck:     Musculoskeletal: Normal range of motion.  Pulmonary:     Effort: Pulmonary effort is normal. No respiratory distress.     Comments: Speaking in full sentences Musculoskeletal: Normal range of motion.  Skin:    Coloration: Skin is not jaundiced or pale.     Findings: No bruising, erythema, lesion or rash.  Neurological:     Mental Status: He is alert and oriented to person, place, and time. Mental status is at baseline.  Psychiatric:        Mood and Affect: Mood normal.  Behavior: Behavior normal.        Thought Content: Thought content normal.        Judgment: Judgment normal.     Results for orders placed or performed in visit on 04/27/19  Bayer DCA Hb A1c Waived  Result Value Ref Range   HB A1C (BAYER DCA - WAIVED) 5.5 <7.0 %  Lipid Panel w/o Chol/HDL Ratio  Result Value Ref Range   Cholesterol, Total 103 100 - 199 mg/dL   Triglycerides 74 0 - 149 mg/dL   HDL 40 >39 mg/dL   VLDL Cholesterol Cal 15 5 - 40 mg/dL   LDL Calculated 48 0 - 99 mg/dL  Comprehensive metabolic panel  Result Value Ref  Range   Glucose 85 65 - 99 mg/dL   BUN 11 6 - 24 mg/dL   Creatinine, Ser 1.05 0.76 - 1.27 mg/dL   GFR calc non Af Amer 83 >59 mL/min/1.73   GFR calc Af Amer 96 >59 mL/min/1.73   BUN/Creatinine Ratio 10 9 - 20   Sodium 140 134 - 144 mmol/L   Potassium 4.6 3.5 - 5.2 mmol/L   Chloride 101 96 - 106 mmol/L   CO2 26 20 - 29 mmol/L   Calcium 9.3 8.7 - 10.2 mg/dL   Total Protein 6.7 6.0 - 8.5 g/dL   Albumin 4.1 4.0 - 5.0 g/dL   Globulin, Total 2.6 1.5 - 4.5 g/dL   Albumin/Globulin Ratio 1.6 1.2 - 2.2   Bilirubin Total 0.3 0.0 - 1.2 mg/dL   Alkaline Phosphatase 70 39 - 117 IU/L   AST 23 0 - 40 IU/L   ALT 20 0 - 44 IU/L      Assessment & Plan:   Problem List Items Addressed This Visit    None    Visit Diagnoses    Upper respiratory tract infection, unspecified type    -  Primary   Resolving. No fever. Will check COVID- order placed today. [place] for cough. Call if gettng not getting better or getting worse.    Relevant Orders   Novel Coronavirus, NAA ([facility])       Follow up plan: Return if symptoms worsen or fail to improve.    . This visit was completed via D[employer]im[employer] due to the restrictions of the COVID-19 pandemic. All issues as above were discussed and addressed. Physical exam was done as above through visual confirmation on Doximity. If it was felt that the patient should be evaluated in the office, they were directed there. The patient verbally consented to this visit. . Location of the patient: work . Location of the provider: work . Those involved with this call:  . Provider: Park Liter, DO . CMA: Tiffany Reel, CMA . Front Desk/Registration: Don Perking  . Time spent on call: 15 minutes with patient face to face via video conference. More than 50% of this time was spent in counseling and coordination of care. 23 minutes total spent in review of patient's record and preparation of their chart.

## 2019-07-12 ENCOUNTER — Encounter: Payer: BC Managed Care – PPO | Admitting: *Deleted

## 2019-07-12 ENCOUNTER — Other Ambulatory Visit: Payer: Self-pay

## 2019-07-12 DIAGNOSIS — Z20822 Contact with and (suspected) exposure to covid-19: Secondary | ICD-10-CM

## 2019-07-13 ENCOUNTER — Ambulatory Visit: Payer: BC Managed Care – PPO | Admitting: Nurse Practitioner

## 2019-07-14 LAB — NOVEL CORONAVIRUS, NAA: SARS-CoV-2, NAA: NOT DETECTED

## 2019-07-18 ENCOUNTER — Other Ambulatory Visit: Payer: Self-pay

## 2019-07-18 MED ORDER — FLUOXETINE HCL 20 MG PO CAPS: ORAL_CAPSULE | ORAL | 3 refills | Status: DC

## 2019-07-18 NOTE — Telephone Encounter (Signed)
[facility] faxed a Rx refill request on Fluoxetine 20 mg capsules

## 2019-07-29 ENCOUNTER — Encounter: Payer: BC Managed Care – PPO | Attending: Nurse Practitioner | Admitting: *Deleted

## 2019-07-29 ENCOUNTER — Other Ambulatory Visit: Payer: Self-pay

## 2019-07-29 ENCOUNTER — Encounter: Payer: Self-pay | Admitting: *Deleted

## 2019-07-29 VITALS — BP 110/72 | Ht 62.0 in | Wt 204.4 lb

## 2019-07-29 DIAGNOSIS — E119 Type 2 diabetes mellitus without complications: Secondary | ICD-10-CM | POA: Insufficient documentation

## 2019-07-29 DIAGNOSIS — Z713 Dietary counseling and surveillance: Secondary | ICD-10-CM | POA: Insufficient documentation

## 2019-07-29 DIAGNOSIS — Z6837 Body mass index (BMI) 37.0-37.9, adult: Secondary | ICD-10-CM | POA: Insufficient documentation

## 2019-07-29 NOTE — Patient Instructions (Signed)
Check blood sugars 2 x day before breakfast and 2 hrs after supper - 3 x week Bring blood sugar records to the next MD appointments  Exercise: Continue walking and aerobics for  30-60 minutes  3-4 days a week  Eat 3 meals day, 2-3  snacks a day Space meals 4-6 hours apart  Decrease smoking   Make an eye doctor appointment  Call back if you want to schedule an appointment with the dietitian or nurse

## 2019-07-29 NOTE — Progress Notes (Signed)
Diabetes Self-Management Education  Visit Type: First/Initial  Appt. Start Time: 1345 Appt. End Time: [id number]  07/29/2019  [name], identified by name and date of birth, is a 48 y.o. male with a diagnosis of Diabetes: Type 2.   ASSESSMENT  Blood pressure 110/72, height 5\' 2"  (1.575 m), weight 204 lb 6.4 oz (92.7 kg). Body mass index is 37.39 kg/m.  Diabetes Self-Management Education - 07/29/19 1533      Visit Information   Visit Type  First/Initial      Initial Visit   Diabetes Type  Type 2    Are you currently following a meal plan?  Yes    What type of meal plan do you follow?  "lower carbohydrates"    Are you taking your medications as prescribed?  Yes    Date Diagnosed  2019      Health Coping   How would you rate your overall health?  Fair      Psychosocial Assessment   Patient Belief/Attitude about Diabetes  Motivated to manage diabetes   "accepting diagnosis - was nervous at first"   Self-care barriers  None    Self-management support  Doctor's office;Family    Patient Concerns  Nutrition/Meal planning;Medication;Monitoring;Healthy Lifestyle;Problem Solving;Glycemic Control;Weight Control    Special Needs  None    Preferred Learning Style  Hands on    Learning Readiness  Change in progress    How often do you need to have someone help you when you read instructions, pamphlets, or other written materials from your doctor or pharmacy?  1 - Never    What is the last grade level you completed in school?  associates degree      Pre-Education Assessment   Patient understands the diabetes disease and treatment process.  Needs Review    Patient understands incorporating nutritional management into lifestyle.  Demonstrates understanding / competency    Patient undertands incorporating physical activity into lifestyle.  Demonstrates understanding / competency    Patient understands using medications safely.  Demonstrates understanding / competency    Patient  understands monitoring blood glucose, interpreting and using results  Demonstrates understanding / competency    Patient understands prevention, detection, and treatment of acute complications.  Needs Review    Patient understands prevention, detection, and treatment of chronic complications.  Demonstrates understanding / competency    Patient understands how to develop strategies to address psychosocial issues.  Needs Review    Patient understands how to develop strategies to promote health/change behavior.  Demonstrates understanding / competency      Complications   Last HgB A1C per patient/outside source  5.5 %   04/27/2019   How often do you check your blood sugar?  1-2 times/day    Fasting Blood glucose range (mg/dL)  70-129   FBG's 90-125 mg/dL   Postprandial Blood glucose range (mg/dL)  70-129;130-179   pp's 92-159 mg/dL   Have you had a dilated eye exam in the past 12 months?  No    Have you had a dental exam in the past 12 months?  Yes    Are you checking your feet?  Yes    How many days per week are you checking your feet?  7      Dietary Intake   Breakfast  1/2 cup oatmeal, [place] sausage, egg withes, 1/2 cup mixed berries, non-sweetened milk, 1/2 waffle    Snack (morning)  1/2 waffle, 1/2 oatmeal    Lunch  salad, tuna fish sandwich, beans,  bread    Snack (afternoon)  protein bar    Dinner  beef, chicken, fish, occasional pork with beans, corn, rice, occasional potatoes and peas, broccoli, cauliflower, greens, cartos, lettuce, tomato    Snack (evening)  graham cracker and peanut butter, low cal muffins    Beverage(s)  water, V-8, diet soda, diet green tea      Exercise   Exercise Type  Light (walking / raking leaves)    How many days per week to you exercise?  3    How many minutes per day do you exercise?  45    Total minutes per week of exercise  135      Patient Education   Previous Diabetes Education  Yes (please comment)   classes last year   Disease state    Explored patient's options for treatment of their diabetes    Nutrition management   Food label reading, portion sizes and measuring food.;Carbohydrate counting;Reviewed blood glucose goals for pre and post meals and how to evaluate the patients' food intake on their blood glucose level.    Physical activity and exercise   Role of exercise on diabetes management, blood pressure control and cardiac health.    Monitoring  Purpose and frequency of SMBG.;Taught/discussed recording of test results and interpretation of SMBG.;Identified appropriate SMBG and/or A1C goals.    Chronic complications  Relationship between chronic complications and blood glucose control;Retinopathy and reason for yearly dilated eye exams    Psychosocial adjustment  Identified and addressed patients feelings and concerns about diabetes    Personal strategies to promote health  Review risk of smoking and offered smoking cessation      Individualized Goals (developed by patient)   Reducing Risk  Improve blood sugars Decrease medications Prevent diabetes complications Lose weight Lead a healthier lifestyle Become more fit     Post-Education Assessment   Patient understands the diabetes disease and treatment process.  Demonstrates understanding / competency    Patient understands incorporating nutritional management into lifestyle.  Demonstrates understanding / competency    Patient undertands incorporating physical activity into lifestyle.  Demonstrates understanding / competency    Patient understands using medications safely.  Needs Review    Patient understands monitoring blood glucose, interpreting and using results  Demonstrates understanding / competency    Patient understands prevention, detection, and treatment of acute complications.  Needs Review    Patient understands prevention, detection, and treatment of chronic complications.  Demonstrates understanding / competency    Patient understands how to develop  strategies to address psychosocial issues.  Demonstrates understanding / competency    Patient understands how to develop strategies to promote health/change behavior.  Demonstrates understanding / competency      Outcomes   Expected Outcomes  Demonstrated interest in learning. Expect positive outcomes    Future DMSE  PRN    Program Status  Completed       Individualized Plan for Diabetes Self-Management Training:   Learning Objective:  Patient will have a greater understanding of diabetes self-management. Patient education plan is to attend individual and/or group sessions per assessed needs and concerns.   Plan:   Patient Instructions  Check blood sugars 2 x day before breakfast and 2 hrs after supper - 3 x week Bring blood sugar records to the next MD appointments Exercise: Continue walking and aerobics for  30-60 minutes  3-4 days a week Eat 3 meals day, 2-3  snacks a day Space meals 4-6 hours apart Decrease smoking  Make an eye doctor appointment Call back if you want to schedule an appointment with the dietitian or nurse  Expected Outcomes:  Demonstrated interest in learning. Expect positive outcomes  Education material provided:  General Meal Planning Guidelines Simple Meal Plan  If problems or questions, patient to contact team via:  Johny Drilling, RN, [place], CDE [phone]  Future DSME appointment: PRN

## 2019-08-01 ENCOUNTER — Other Ambulatory Visit: Payer: Self-pay

## 2019-08-01 ENCOUNTER — Ambulatory Visit (INDEPENDENT_AMBULATORY_CARE_PROVIDER_SITE_OTHER): Payer: BC Managed Care – PPO | Admitting: Nurse Practitioner

## 2019-08-01 ENCOUNTER — Encounter: Payer: Self-pay | Admitting: Nurse Practitioner

## 2019-08-01 VITALS — BP 119/76 | HR 58 | Temp 98.3°F | Wt 209.0 lb

## 2019-08-01 DIAGNOSIS — Z1211 Encounter for screening for malignant neoplasm of colon: Secondary | ICD-10-CM

## 2019-08-01 DIAGNOSIS — E1169 Type 2 diabetes mellitus with other specified complication: Secondary | ICD-10-CM | POA: Diagnosis not present

## 2019-08-01 DIAGNOSIS — E119 Type 2 diabetes mellitus without complications: Secondary | ICD-10-CM

## 2019-08-01 DIAGNOSIS — I10 Essential (primary) hypertension: Secondary | ICD-10-CM | POA: Diagnosis not present

## 2019-08-01 DIAGNOSIS — E785 Hyperlipidemia, unspecified: Secondary | ICD-10-CM

## 2019-08-01 LAB — BAYER DCA HB A1C WAIVED: HB A1C (BAYER DCA - WAIVED): 5.6 % (ref <7.0)

## 2019-08-01 MED ORDER — MELOXICAM 7.5 MG PO TABS: 15.0000 mg | ORAL_TABLET | Freq: Every day | ORAL | 0 refills | Status: DC

## 2019-08-01 NOTE — Assessment & Plan Note (Signed)
Chronic, stable with A1C today 5.6% remaining off Metformin and maintaining diet + weight.  Praised him for 50 pounds of weight loss in past 6 months.  Will continue off medication at this time and continue diet focus.  Return in 6 months.

## 2019-08-01 NOTE — Assessment & Plan Note (Signed)
Chronic, ongoing.  Continue current medication regimen and adjust as needed.  Lipid panel and CMP next visit, recent in August at goal.

## 2019-08-01 NOTE — Assessment & Plan Note (Signed)
Chronic, stable with weight loss BP has been trending downwards. Continue Lisinopril-HCTZ and Amlodipine.  Recommend checking BP at home three mornings a week and notify provider if readings >130/90.   Praised him for 50 pounds of weight loss in past 6 months.  Recommend complete smoking cessation.  Return in 6 months.

## 2019-08-01 NOTE — Patient Instructions (Signed)
Carbohydrate Counting for Diabetes Mellitus, Adult  Carbohydrate counting is a method of keeping track of how many carbohydrates you eat. Eating carbohydrates naturally increases the amount of sugar (glucose) in the blood. Counting how many carbohydrates you eat helps keep your blood glucose within normal limits, which helps you manage your diabetes (diabetes mellitus). It is important to know how many carbohydrates you can safely have in each meal. This is different for every person. A diet and nutrition specialist (registered dietitian) can help you make a meal plan and calculate how many carbohydrates you should have at each meal and snack. Carbohydrates are found in the following foods:  Grains, such as breads and cereals.  Dried beans and soy products.  Starchy vegetables, such as potatoes, peas, and corn.  Fruit and fruit juices.  Milk and yogurt.  Sweets and snack foods, such as cake, cookies, candy, chips, and soft drinks. How do I count carbohydrates? There are two ways to count carbohydrates in food. You can use either of the methods or a combination of both. Reading "Nutrition Facts" on packaged food The "Nutrition Facts" list is included on the labels of almost all packaged foods and beverages in the [place] It includes:  The serving size.  Information about nutrients in each serving, including the grams (g) of carbohydrate per serving. To use the "Nutrition Facts":  Decide how many servings you will have.  Multiply the number of servings by the number of carbohydrates per serving.  The resulting number is the total amount of carbohydrates that you will be having. Learning standard serving sizes of other foods When you eat carbohydrate foods that are not packaged or do not include "Nutrition Facts" on the label, you need to measure the servings in order to count the amount of carbohydrates:  Measure the foods that you will eat with a food scale or measuring cup, if needed.   Decide how many standard-size servings you will eat.  Multiply the number of servings by 15. Most carbohydrate-rich foods have about 15 g of carbohydrates per serving. ? For example, if you eat 8 oz (170 g) of strawberries, you will have eaten 2 servings and 30 g of carbohydrates (2 servings x 15 g = 30 g).  For foods that have more than one food mixed, such as soups and casseroles, you must count the carbohydrates in each food that is included. The following list contains standard serving sizes of common carbohydrate-rich foods. Each of these servings has about 15 g of carbohydrates:   hamburger bun or  English muffin.   oz (15 mL) syrup.   oz (14 g) jelly.  1 slice of bread.  1 six-inch tortilla.  3 oz (85 g) cooked rice or pasta.  4 oz (113 g) cooked dried beans.  4 oz (113 g) starchy vegetable, such as peas, corn, or potatoes.  4 oz (113 g) hot cereal.  4 oz (113 g) mashed potatoes or  of a large baked potato.  4 oz (113 g) canned or frozen fruit.  4 oz (120 mL) fruit juice.  4-6 crackers.  6 chicken nuggets.  6 oz (170 g) unsweetened dry cereal.  6 oz (170 g) plain fat-free yogurt or yogurt sweetened with artificial sweeteners.  8 oz (240 mL) milk.  8 oz (170 g) fresh fruit or one small piece of fruit.  24 oz (680 g) popped popcorn. Example of carbohydrate counting Sample meal  3 oz (85 g) chicken breast.  6 oz (170 g)   brown rice.  4 oz (113 g) corn.  8 oz (240 mL) milk.  8 oz (170 g) strawberries with sugar-free whipped topping. Carbohydrate calculation 1. Identify the foods that contain carbohydrates: ? Rice. ? Corn. ? Milk. ? Strawberries. 2. Calculate how many servings you have of each food: ? 2 servings rice. ? 1 serving corn. ? 1 serving milk. ? 1 serving strawberries. 3. Multiply each number of servings by 15 g: ? 2 servings rice x 15 g = 30 g. ? 1 serving corn x 15 g = 15 g. ? 1 serving milk x 15 g = 15 g. ? 1 serving  strawberries x 15 g = 15 g. 4. Add together all of the amounts to find the total grams of carbohydrates eaten: ? 30 g + 15 g + 15 g + 15 g = 75 g of carbohydrates total. Summary  Carbohydrate counting is a method of keeping track of how many carbohydrates you eat.  Eating carbohydrates naturally increases the amount of sugar (glucose) in the blood.  Counting how many carbohydrates you eat helps keep your blood glucose within normal limits, which helps you manage your diabetes.  A diet and nutrition specialist (registered dietitian) can help you make a meal plan and calculate how many carbohydrates you should have at each meal and snack. This information is not intended to replace advice given to you by your health care provider. Make sure you discuss any questions you have with your health care provider. Document Released: 09/01/2005 Document Revised: 03/26/2017 Document Reviewed: 02/13/2016 Elsevier Patient Education  2020 Else[employer] Inc.  

## 2019-08-01 NOTE — Progress Notes (Signed)
BP 119/76   Pulse (!) 58   Temp 98.3 F (36.8 C) (Oral)   Wt 209 lb (94.8 kg)   SpO2 98%   BMI 38.23 kg/m    Subjective:    Patient ID: Lance Kim, male    DOB: November 26, 1970, 48 y.o.   MRN: [id number]  HPI: Lance Kim is a 48 y.o. male  Chief Complaint  Patient presents with  . Diabetes  . Hypertension  . Hyperlipidemia   DIABETES Last visit A1C 5.5% and continues off medication at this time. Has lost almost 50 pounds since December and reports feeling better with this weight loss.   Hypoglycemic episodes:no Polydipsia/polyuria: no Visual disturbance: no Chest pain: no Paresthesias: no Glucose Monitoring: no  Accucheck frequency: Daily  Fasting glucose: 100-130  Post prandial:  Evening: 130-159  Before meals: Taking Insulin?: no  Long acting insulin:  Short acting insulin: Blood Pressure Monitoring: not checking Retinal Examination: Not up to Date Foot Exam: Up to Date Pneumovax: Up to Date Influenza: Up to Date Aspirin: yes   HYPERTENSION / HYPERLIPIDEMIA Continues on Lisinopril-HCTZ, Amlodipine, and Atorvastatin, ASA.  Currently smoking 1/2 PPD, does not wish to quit.   Satisfied with current treatment? yes Duration of hypertension: chronic BP monitoring frequency: not checking BP range:  BP medication side effects: no Duration of hyperlipidemia: chronic Cholesterol medication side effects: no Cholesterol supplements: none Medication compliance: good compliance Aspirin: yes Recent stressors: no Recurrent headaches: no Visual changes: no Palpitations: no Dyspnea: no Chest pain: no Lower extremity edema: no Dizzy/lightheaded: no  Relevant past medical, surgical, family and social history reviewed and updated as indicated. Interim medical history since our last visit reviewed. Allergies and medications reviewed and updated.  Review of Systems  Constitutional: Negative for activity change, diaphoresis, fatigue and fever.  Respiratory:  Negative for cough, chest tightness, shortness of breath and wheezing.   Cardiovascular: Negative for chest pain, palpitations and leg swelling.  Gastrointestinal: Negative for abdominal distention, abdominal pain, constipation, diarrhea, nausea and vomiting.  Endocrine: Negative for cold intolerance, heat intolerance, polydipsia, polyphagia and polyuria.  Musculoskeletal: Negative.   Neurological: Negative for dizziness, syncope, weakness, light-headedness, numbness and headaches.  Psychiatric/Behavioral: Negative.     Per HPI unless specifically indicated above     Objective:    BP 119/76   Pulse (!) 58   Temp 98.3 F (36.8 C) (Oral)   Wt 209 lb (94.8 kg)   SpO2 98%   BMI 38.23 kg/m   Wt Readings from Last 3 Encounters:  08/01/19 209 lb (94.8 kg)  07/29/19 204 lb 6.4 oz (92.7 kg)  04/27/19 206 lb 6.4 oz (93.6 kg)    Physical Exam Vitals signs and nursing note reviewed.  Constitutional:      General: He is awake. He is not in acute distress.    Appearance: He is well-developed and overweight. He is not ill-appearing.  HENT:     Head: Normocephalic and atraumatic.     Right Ear: Hearing normal. No drainage.     Left Ear: Hearing normal. No drainage.  Eyes:     General: Lids are normal.        Right eye: No discharge.        Left eye: No discharge.     Conjunctiva/sclera: Conjunctivae normal.     Pupils: Pupils are equal, round, and reactive to light.  Neck:     Musculoskeletal: Normal range of motion and neck supple.     Thyroid: No thyromegaly.  Vascular: No carotid bruit.  Cardiovascular:     Rate and Rhythm: Normal rate and regular rhythm.     Heart sounds: Normal heart sounds, S1 normal and S2 normal. No murmur. No gallop.   Pulmonary:     Effort: Pulmonary effort is normal. No accessory muscle usage or respiratory distress.     Breath sounds: Normal breath sounds.  Abdominal:     General: Bowel sounds are normal.     Palpations: Abdomen is soft.   Musculoskeletal: Normal range of motion.     Right lower leg: No edema.     Left lower leg: No edema.  Skin:    General: Skin is warm and dry.     Capillary Refill: Capillary refill takes less than 2 seconds.  Neurological:     Mental Status: He is alert and oriented to person, place, and time.     Deep Tendon Reflexes: Reflexes are normal and symmetric.  Psychiatric:        Mood and Affect: Mood normal.        Behavior: Behavior normal. Behavior is cooperative.        Thought Content: Thought content normal.        Judgment: Judgment normal.    Diabetic Foot Exam - Simple   No data filed      Results for orders placed or performed in visit on 07/12/19  Novel Coronavirus, NAA ([facility])   Specimen: Oropharyngeal(OP) collection in vial transport medium   OROPHARYNGEA  TESTING  Result Value Ref Range   SARS-CoV-2, NAA Not Detected Not Detected      Assessment & Plan:   Problem List Items Addressed This Visit      Cardiovascular and Mediastinum   Benign essential HTN    Chronic, stable with weight loss BP has been trending downwards. Continue Lisinopril-HCTZ and Amlodipine.  Recommend checking BP at home three mornings a week and notify provider if readings >130/90.   Praised him for 50 pounds of weight loss in past 6 months.  Recommend complete smoking cessation.  Return in 6 months.        Endocrine   Type 2 diabetes mellitus without complication, without long-term current use of insulin (HCC) - Primary (Chronic)    Chronic, stable with A1C today 5.6% remaining off Metformin and maintaining diet + weight.  Praised him for 50 pounds of weight loss in past 6 months.  Will continue off medication at this time and continue diet focus.  Return in 6 months.      Relevant Orders   Bayer DCA Hb A1c Waived   Hyperlipidemia associated with type 2 diabetes mellitus (HCC)    Chronic, ongoing.  Continue current medication regimen and adjust as needed.  Lipid panel and CMP next visit,  recent in August at goal.        Other Visit Diagnoses    Colon cancer screening       Referral to GI per patient request, smoker and African American male at age 61 and would like colonoscopy.   Relevant Orders   Ambulatory referral to Gastroenterology       Follow up plan: Return in about 6 months (around 01/29/2020) for T2DM, HTN/HLD, Depression.

## 2019-08-04 ENCOUNTER — Telehealth: Payer: Self-pay

## 2019-08-04 ENCOUNTER — Other Ambulatory Visit: Payer: Self-pay

## 2019-08-04 DIAGNOSIS — Z1211 Encounter for screening for malignant neoplasm of colon: Secondary | ICD-10-CM

## 2019-08-04 NOTE — Telephone Encounter (Signed)
Gastroenterology Pre-Procedure Review  Request Date: Tuesday 08/16/19 Requesting Physician: Dr. Vicente Males  PATIENT REVIEW QUESTIONS: The patient responded to the following health history questions as indicated:    1. Are you having any GI issues? no 2. Do you have a personal history of Polyps? no 3. Do you have a family history of Colon Cancer or Polyps? no 4. Diabetes Mellitus? Yes oral meds 5. Joint replacements in the past 12 months?no 6. Major health problems in the past 3 months?no 7. Any artificial heart valves, MVP, or defibrillator?no    MEDICATIONS & ALLERGIES:    Patient reports the following regarding taking any anticoagulation/antiplatelet therapy:   Plavix, Coumadin, Eliquis, Xarelto, Lovenox, Pradaxa, Brilinta, or Effient? no Aspirin? yes (81 mg daily)  Patient confirms/reports the following medications:  Current Outpatient Medications  Medication Sig Dispense Refill  . amLODipine (NORVASC) 10 MG tablet TAKE 1 TABLET(10 MG) BY MOUTH DAILY 90 tablet 2  . Apremilast (OTEZLA) 30 MG TABS Take 1 tablet by mouth 2 (two) times daily.    [name] aspirin EC 81 MG tablet Take 81 mg by mouth daily.    [name] atorvastatin (LIPITOR) 20 MG tablet TAKE 1 TABLET(20 MG) BY MOUTH DAILY (Patient taking differently: Take 10 mg by mouth daily. ) 90 tablet 0  . benzonatate (TESSALON) 200 MG capsule Take 1 capsule (200 mg total) by mouth 2 (two) times daily as needed for cough. (Patient not taking: Reported on 07/29/2019) 20 capsule 0  . cetirizine (ZYRTEC) 10 MG tablet Take 1 tablet (10 mg total) by mouth daily. 90 tablet 3  . cyclobenzaprine (FLEXERIL) 10 MG tablet Take 1 tablet (10 mg total) by mouth 3 (three) times daily as needed for muscle spasms. (Patient not taking: Reported on 04/27/2019) 30 tablet 0  . FIBER ADULT GUMMIES PO Take 2 tablets by mouth daily.     [name] FLUoxetine (PROZAC) 20 MG capsule TAKE 1 CAPSULE(20 MG) BY MOUTH DAILY 90 capsule 3  . fluticasone (FLONASE) 50 MCG/ACT nasal spray USE 2  SPRAYS IN EACH NOSTRIL TWICE DAILY 16 g 1  . glucose blood test strip Use as instructed 100 each 12  . lisinopril-hydrochlorothiazide (ZESTORETIC) 20-12.5 MG tablet Take 1 tablet by mouth daily. 90 tablet 2  . meloxicam (MOBIC) 7.5 MG tablet Take 2 tablets (15 mg total) by mouth daily. TAKE 2 TABLETS DAILY 180 tablet 0  . Multiple Vitamin (MULTIVITAMIN) tablet Take 2 tablets by mouth daily.     . ondansetron (ZOFRAN-ODT) 4 MG disintegrating tablet DISSOLVE 1 TABLET ON THE TONGUE EVERY 8 HOURS AS NEEDED FOR NAUSEA 20 tablet 2  . Triamcinolone Acetonide (TRIAMCINOLONE 0.1 % CREAM : EUCERIN) CREA Apply 1 application topically daily. Please supply one jar 1 each 3  . UNABLE TO FIND Lancets (Contour Choice)     No current facility-administered medications for this visit.     Patient confirms/reports the following allergies:  Allergies  Allergen Reactions  . Mevacor [Lovastatin] Other (See Comments)    CRAMPS    No orders of the defined types were placed in this encounter.   AUTHORIZATION INFORMATION Primary Insurance: 1D#: Group #:  Secondary Insurance: 1D#: Group #:  SCHEDULE INFORMATION: Date: 08/16/19 Time: Location:[facility]

## 2019-08-05 ENCOUNTER — Ambulatory Visit: Payer: Self-pay

## 2019-08-05 NOTE — Telephone Encounter (Signed)
Please advise patient to attend urgent care today to have toe and back/groin issues looked.  I want to ensure toe does not need any abx or treatment.  Thank you.

## 2019-08-05 NOTE — Telephone Encounter (Signed)
Routing to provider to advise patient.

## 2019-08-05 NOTE — Telephone Encounter (Signed)
Outgoing call from Patient who states that he was cutting his ingrown toenails on right big toe last night.  Cut a whole in his toe.  Site started bleeding.  States that it not swelling .  States that he is a diabetic.   No problems walking.  Pain rated 3 to 4.  Patient thinks that his tetanus is current.  Patient is a diabetic.  Patient is also complaining of lower back pain and groin area.  Sate that he lifted something heavy .  Has been taking Ibuprofen  For pain. Encouraged to call back if Sx. Worsen.  Voiced understanding. Patient request a return call related to his toe.   Cleaned with peroxide and applied  Neosporin           Reason for Disposition . Caused by a twisting, bending, or lifting injury  Answer Assessment - Initial Assessment Questions 1. MECHANISM: "How did the injury happen?"      Cutting toenails bleeding peroxide and neosporin 2. ONSET: "When did the injury happen?" (Minutes or hours ago)      Last night.   3. LOCATION: "What part of the toe is injured?" "Is the nail damaged?"     Right big toe.   4. APPEARANCE of TOE INJURY: "What does the injury look like?"     Big whole 5. SEVERITY: "Can you use the foot normally?" "Can you walk?"       Can walk 6. SIZE: For cuts, bruises, or swelling, ask: "How large is it?" (e.g., inches or centimeters;  entire toe)      Not swollen  7. PAIN: "Is there pain?" If so, ask: "How bad is the pain?"   (e.g., Scale 1-10; or mild, moderate, severe)     Rated 3 to 4 8. TETANUS: For any breaks in the skin, ask: "When was the last tetanus booster?"    Thinks tetanus is current.   9. DIABETES: "Do you have a history of diabetes or poor circulation in the feet?"    Yes.   10. OTHER SYMPTOMS: "Do you have any other symptoms?"        *No Answer* 11. PREGNANCY: "Is there any chance you are pregnant?" "When was your last menstrual period?"       *No Answer*  Answer Assessment - Initial Assessment Questions 1. ONSET: "When did the pain  begin?"    earliwr  In the week 2. LOCATION: "Where does it hurt?" (upper, mid or lower back)     Lower back  3. SEVERITY: "How bad is the pain?"  (e.g., Scale 1-10; mild, moderate, or severe)   - MILD (1-3): doesn't interfere with normal activities    - MODERATE (4-7): interferes with normal activities or awakens from sleep    - SEVERE (8-10): excruciating pain, unable to do any normal activities     mild 4. PATTERN: "Is the pain constant?" (e.g., yes, no; constant, intermittent)      constant 5. RADIATION: "Does the pain shoot into your legs or elsewhere?"     Knee hurts also 6. CAUSE:  "What do you think is causing the back pain?"      Lifting.  Something heavy at work 7. BACK OVERUSE:  "Any recent lifting of heavy objects, strenuous work or exercise?"    yea 8. MEDICATIONS: "What have you taken so far for the pain?" (e.g., nothing, acetaminophen, NSAIDS)    Nsaids 9. NEUROLOGIC SYMPTOMS: "Do you have any weakness, numbness, or problems with bowel/bladder control?"  denies 10. OTHER SYMPTOMS: "Do you have any other symptoms?" (e.g., fever, abdominal pain, burning with urination, blood in urine)      abdominal 11. PREGNANCY: "Is there any chance you are pregnant?" (e.g., yes, no; LMP)      na  Protocols used: BACK PAIN-A-AH, TOE INJURY-A-AH

## 2019-08-05 NOTE — Telephone Encounter (Signed)
Patient notified and verbalized understanding. 

## 2019-08-12 ENCOUNTER — Other Ambulatory Visit: Payer: Self-pay

## 2019-08-12 ENCOUNTER — Other Ambulatory Visit
Admission: RE | Admit: 2019-08-12 | Discharge: 2019-08-12 | Disposition: A | Discharge status: Home / Self Care | Payer: BC Managed Care – PPO | Source: Ambulatory Visit | Attending: Gastroenterology | Admitting: Gastroenterology

## 2019-08-12 ENCOUNTER — Ambulatory Visit: Payer: Self-pay | Admitting: *Deleted

## 2019-08-12 DIAGNOSIS — Z20828 Contact with and (suspected) exposure to other viral communicable diseases: Secondary | ICD-10-CM | POA: Diagnosis not present

## 2019-08-12 DIAGNOSIS — Z01812 Encounter for preprocedural laboratory examination: Secondary | ICD-10-CM | POA: Diagnosis not present

## 2019-08-12 LAB — SARS CORONAVIRUS 2 (TAT 6-24 HRS): SARS Coronavirus 2: NEGATIVE

## 2019-08-12 NOTE — Telephone Encounter (Signed)
Summary: back pain   Pt states that he lifted something that was too heavy and hurt his back. Pt wants to know what over the counter medication he can take to help with pain.      Attempted to call patient - left message to call back

## 2019-08-12 NOTE — Telephone Encounter (Signed)
Returned call to patient regarding back pain and what he can take over the counter.  He thinks that he recently lifted something heavy and hurt his lower back. He has taken ibuprofen but wanted to know if he could get flexeril. He last received it in March 2020. He has meloxicam  ordered for him already, he just needs to go pick it up.  He is also advised to use hot and cold alternately for the pain. If he starts having neurological symptoms such as weakness in his extremities, problems with bowel or bladder, numbness, tingling going down his legs. He should be seen in the ED.  He voiced understanding.

## 2019-08-15 ENCOUNTER — Ambulatory Visit: Payer: Self-pay | Admitting: *Deleted

## 2019-08-15 ENCOUNTER — Other Ambulatory Visit: Payer: Self-pay

## 2019-08-15 MED ORDER — CYCLOBENZAPRINE HCL 10 MG PO TABS: 10.0000 mg | ORAL_TABLET | Freq: Three times a day (TID) | ORAL | 0 refills | Status: DC | PRN

## 2019-08-15 NOTE — Telephone Encounter (Signed)
Summary: drinking prep drink   Please call pt per daughter got sick drinking prep for colonoscopy tomorrow... advice [phone]      Returned call to patient who states that he had 2 episodes of emesis after completing the first bottle of prep and after almost finishing the first bottle of water. Pt states that his last episode of emesis was about 20-30 min prior to the call and states it was just liquid but not a lot but unsure. Pt states he has not had a BM at this time but feels like his stomach is rumbling and bloated. Patient states that his next round of prep will not be until the morning Pt advised to contact GI physician with current symptoms and/or if symptosm to make sure that he would be able to complete the prep before the procedure tomorrow. Pt  Verbalized understanding.   Reason for Disposition . Bowel prep for colonoscopy, questions about . [1] MILD to MODERATE vomiting (e.g., 1 to 5 times a day) AND [2] lasting > 24 hours  Answer Assessment - Initial Assessment Questions 1. DATE/TIME: "When did you have your colonosocpy?"     Colonoscopy scheduled for tomorrow 2. MAIN CONCERN: "What is your main concern right now?" "What questions do you have?"     vomitting 3. ABDOMEN PAIN: "Are you having any abdomen (belly or stomach) pain?" If Yes, "How bad is it?" (e.g., Scale 1-10; mild, moderate, severe).    - MILD (1-3): doesn't interfere with normal activities, abdomen soft and not tender to touch     - MODERATE (4-7): interferes with normal activities or awakens from sleep, tender to touch     - SEVERE (8-10): excruciating pain, doubled over, unable to do any normal activities       No c/o pain 4. OTHER SYMPTOMS: "What other symptoms are you having?" (e.g., rectal bleeding, bloating or feeling gassy, passing gas, vomiting, dizziness, fever).     Vomiting x 2,  5. ONSET: "When did your symptoms start?"     About 20-30 min prior to speaking with nurse 6. PATTERN: "Is the symptom(s)  constant or does it come and go?" "Is your symptom(s) getting worse, better, or staying the same?"     No vomiting at this time  Protocols used: COLONOSCOPY SYMPTOMS AND QUESTIONS-A-AH

## 2019-08-16 ENCOUNTER — Ambulatory Visit
Admission: RE | Admit: 2019-08-16 | Discharge: 2019-08-16 | Disposition: A | Discharge status: Home / Self Care | Payer: BC Managed Care – PPO | Source: Ambulatory Visit | Attending: Gastroenterology | Admitting: Gastroenterology

## 2019-08-16 ENCOUNTER — Encounter
Admission: RE | Disposition: A | Discharge status: Home / Self Care | Payer: Self-pay | Source: Ambulatory Visit | Attending: Gastroenterology

## 2019-08-16 SURGERY — COLONOSCOPY WITH PROPOFOL
Anesthesia: General

## 2019-08-22 ENCOUNTER — Telehealth: Payer: Self-pay | Admitting: Gastroenterology

## 2019-08-22 NOTE — Telephone Encounter (Signed)
Pt left vm he had a procedure scheduled last Tuesday that was canceled and he is calling to reschedule

## 2019-08-23 NOTE — Telephone Encounter (Signed)
Returned patients call to reschedule his colonoscopy.  I gave him the following dates to consider with Dr. Vicente Males Friday 12/18, and Monday 12/28. He said he still has previous instructions to refer to.  He did tell me he will need a different bowel prep, and will call me to confirm which date works best for him.  When he calls back I will let him know about the sample bowel prep that we have as well.  Thanks [employer]

## 2019-08-25 ENCOUNTER — Other Ambulatory Visit: Payer: Self-pay

## 2019-08-25 DIAGNOSIS — Z1211 Encounter for screening for malignant neoplasm of colon: Secondary | ICD-10-CM

## 2019-08-25 MED ORDER — NA SULFATE-K SULFATE-MG SULF 17.5-3.13-1.6 GM/177ML PO SOLN: 1.0000 | Freq: Once | ORAL | 0 refills | Status: AC

## 2019-08-30 ENCOUNTER — Other Ambulatory Visit
Admission: RE | Admit: 2019-08-30 | Discharge: 2019-08-30 | Disposition: A | Discharge status: Home / Self Care | Payer: BC Managed Care – PPO | Source: Ambulatory Visit | Attending: Gastroenterology | Admitting: Gastroenterology

## 2019-08-30 DIAGNOSIS — Z20828 Contact with and (suspected) exposure to other viral communicable diseases: Secondary | ICD-10-CM | POA: Diagnosis not present

## 2019-08-30 DIAGNOSIS — Z01812 Encounter for preprocedural laboratory examination: Secondary | ICD-10-CM | POA: Insufficient documentation

## 2019-08-30 LAB — SARS CORONAVIRUS 2 (TAT 6-24 HRS): SARS Coronavirus 2: NEGATIVE

## 2019-09-02 ENCOUNTER — Ambulatory Visit
Admission: RE | Admit: 2019-09-02 | Discharge: 2019-09-02 | Disposition: A | Discharge status: Home / Self Care | Payer: BC Managed Care – PPO | Attending: Gastroenterology | Admitting: Gastroenterology

## 2019-09-02 ENCOUNTER — Other Ambulatory Visit: Payer: Self-pay

## 2019-09-02 ENCOUNTER — Ambulatory Visit: Payer: BC Managed Care – PPO | Admitting: Anesthesiology

## 2019-09-02 ENCOUNTER — Encounter
Admission: RE | Disposition: A | Discharge status: Home / Self Care | Payer: Self-pay | Source: Home / Self Care | Attending: Gastroenterology

## 2019-09-02 ENCOUNTER — Encounter: Payer: Self-pay | Admitting: Gastroenterology

## 2019-09-02 DIAGNOSIS — K635 Polyp of colon: Secondary | ICD-10-CM | POA: Diagnosis not present

## 2019-09-02 DIAGNOSIS — Z791 Long term (current) use of non-steroidal anti-inflammatories (NSAID): Secondary | ICD-10-CM | POA: Insufficient documentation

## 2019-09-02 DIAGNOSIS — E785 Hyperlipidemia, unspecified: Secondary | ICD-10-CM | POA: Insufficient documentation

## 2019-09-02 DIAGNOSIS — D125 Benign neoplasm of sigmoid colon: Secondary | ICD-10-CM | POA: Diagnosis not present

## 2019-09-02 DIAGNOSIS — E119 Type 2 diabetes mellitus without complications: Secondary | ICD-10-CM | POA: Insufficient documentation

## 2019-09-02 DIAGNOSIS — I10 Essential (primary) hypertension: Secondary | ICD-10-CM | POA: Insufficient documentation

## 2019-09-02 DIAGNOSIS — F17210 Nicotine dependence, cigarettes, uncomplicated: Secondary | ICD-10-CM | POA: Insufficient documentation

## 2019-09-02 DIAGNOSIS — Z1211 Encounter for screening for malignant neoplasm of colon: Secondary | ICD-10-CM | POA: Insufficient documentation

## 2019-09-02 DIAGNOSIS — Z79899 Other long term (current) drug therapy: Secondary | ICD-10-CM | POA: Diagnosis not present

## 2019-09-02 DIAGNOSIS — Z7982 Long term (current) use of aspirin: Secondary | ICD-10-CM | POA: Insufficient documentation

## 2019-09-02 DIAGNOSIS — F329 Major depressive disorder, single episode, unspecified: Secondary | ICD-10-CM | POA: Insufficient documentation

## 2019-09-02 HISTORY — PX: COLONOSCOPY WITH PROPOFOL: SHX5780

## 2019-09-02 LAB — GLUCOSE, CAPILLARY: Glucose-Capillary: 96 mg/dL (ref 70–99)

## 2019-09-02 SURGERY — COLONOSCOPY WITH PROPOFOL
Anesthesia: General

## 2019-09-02 MED ORDER — PROPOFOL 10 MG/ML IV BOLUS
INTRAVENOUS | Status: DC | PRN
  Administered 2019-09-02: 80 mg via INTRAVENOUS
  Administered 2019-09-02: 40 mg via INTRAVENOUS

## 2019-09-02 MED ORDER — SODIUM CHLORIDE 0.9 % IV SOLN: INTRAVENOUS | Status: DC

## 2019-09-02 MED ORDER — LIDOCAINE HCL (CARDIAC) PF 100 MG/5ML IV SOSY
PREFILLED_SYRINGE | INTRAVENOUS | Status: DC | PRN
  Administered 2019-09-02: 100 mg via INTRAVENOUS

## 2019-09-02 MED ORDER — PROPOFOL 500 MG/50ML IV EMUL
INTRAVENOUS | Status: DC | PRN
  Administered 2019-09-02: 100 ug/kg/min via INTRAVENOUS

## 2019-09-02 NOTE — Transfer of Care (Signed)
Immediate Anesthesia Transfer of Care Note  Patient: Lance Kim  Procedure(s) Performed: COLONOSCOPY WITH PROPOFOL (N/A )  Patient Location: PACU and Endoscopy Unit  Anesthesia Type:General  Level of Consciousness: awake, oriented, drowsy and patient cooperative  Airway & Oxygen Therapy: Patient Spontanous Breathing  Post-op Assessment: Report given to RN, Post -op Vital signs reviewed and stable and Patient moving all extremities  Post vital signs: Reviewed and stable  Last Vitals:  Vitals Value Taken Time  BP 114/76 09/02/19 1050  Temp 36.4 C 09/02/19 1050  Pulse 65 09/02/19 1050  Resp 18 09/02/19 1053  SpO2 100 % 09/02/19 1050  Vitals shown include unvalidated device data.  Last Pain:  Vitals:   09/02/19 1050  TempSrc: Temporal  PainSc: 0-No pain         Complications: No apparent anesthesia complications

## 2019-09-02 NOTE — Op Note (Signed)
[facility] Patient Name: Lance Kim Procedure Date: 09/02/2019 10:09 AM MRN: [id number] Account #: [id number] Date of Birth: February 26, 1971 Admit Type: Outpatient Age: 48 Room: [facility] ENDO ROOM 3 Gender: Male Note Status: Finalized Procedure:             Colonoscopy Indications:           Screening for colorectal malignant neoplasm Providers:             [name] MD, MD Referring MD:          Venita Lick, NP Medicines:             Monitored Anesthesia Care Complications:         No immediate complications. Procedure:             Pre-Anesthesia Assessment:                        - Prior to the procedure, a History and Physical was                         performed, and patient medications, allergies and                         sensitivities were reviewed. The patient's tolerance                         of previous anesthesia was reviewed.                        - The risks and benefits of the procedure and the                         sedation options and risks were discussed with the                         patient. All questions were answered and informed                         consent was obtained.                        - ASA Grade Assessment: II - A patient with mild                         systemic disease.                        After obtaining informed consent, the colonoscope was                         passed under direct vision. Throughout the procedure,                         the patient's blood pressure, pulse, and oxygen                         saturations were monitored continuously. The                         Colonoscope was introduced through the anus and  advanced to the the cecum, identified by the                         appendiceal orifice. The colonoscopy was performed                         with ease. The patient tolerated the procedure well.                         The quality of the bowel  preparation was excellent. Findings:      The perianal and digital rectal examinations were normal.      A 5 mm polyp was found in the sigmoid colon. The polyp was sessile. The       polyp was removed with a cold snare. Resection and retrieval were       complete.      The exam was otherwise without abnormality on direct and retroflexion       views. Impression:            - One 5 mm polyp in the sigmoid colon, removed with a                         cold snare. Resected and retrieved.                        - The examination was otherwise normal on direct and                         retroflexion views. Recommendation:        - Discharge patient to home (with escort).                        - Resume previous diet.                        - Continue present medications.                        - Await pathology results.                        - Repeat colonoscopy for surveillance based on                         pathology results. Procedure Code(s):     --- Professional ---                        [phone], Colonoscopy, flexible; with removal of                         tumor(s), polyp(s), or other lesion(s) by snare                         technique Diagnosis Code(s):     --- Professional ---                        Z12.11, Encounter for screening for malignant neoplasm  of colon                        K63.5, Polyp of colon CPT copyright 2019 American Medical Association. All rights reserved. The codes documented in this report are preliminary and upon coder review may  be revised to meet current compliance requirements. [name], MD [name] MD, MD 09/02/2019 10:47:52 AM This report has been signed electronically. Number of Addenda: 0 Note Initiated On: 09/02/2019 10:09 AM Scope Withdrawal Time: 0 hours 11 minutes 6 seconds  Total Procedure Duration: 0 hours 13 minutes 42 seconds  Estimated Blood Loss:  Estimated blood loss: none.      [facility]

## 2019-09-02 NOTE — Anesthesia Postprocedure Evaluation (Signed)
Anesthesia Post Note  Patient: Lance Kim  Procedure(s) Performed: COLONOSCOPY WITH PROPOFOL (N/A )  Patient location during evaluation: Endoscopy Anesthesia Type: General Level of consciousness: awake and alert Pain management: pain level controlled Vital Signs Assessment: post-procedure vital signs reviewed and stable Respiratory status: spontaneous breathing, nonlabored ventilation, respiratory function stable and patient connected to nasal cannula oxygen Cardiovascular status: blood pressure returned to baseline and stable Postop Assessment: no apparent nausea or vomiting Anesthetic complications: no     Last Vitals:  Vitals:   09/02/19 1100 09/02/19 1110  BP: 121/85 126/84  Pulse: 63 67  Resp: 18 (!) 22  Temp:    SpO2: 100% 100%    Last Pain:  Vitals:   09/02/19 1110  TempSrc:   PainSc: 0-No pain                 Martha Clan

## 2019-09-02 NOTE — H&P (Signed)
Lance Bellows, MD [address], [place], [place], [place], [address] [address], [place], [place], [place], [address] Phone: [phone]  Fax: [phone]  Primary Care Physician:  Lance Lick, Kim   Pre-Procedure History & Physical: HPI:  Lance Kim is a 48 y.o. male is here for an colonoscopy.   Past Medical History:  Diagnosis Date  . Anxiety   . Depression   . Diabetes mellitus without complication ([place])   . Hyperlipidemia   . Hypertension   . Hypertensive renal disease without failure   . Tobacco use disorder     Past Surgical History:  Procedure Laterality Date  . WISDOM TOOTH EXTRACTION      Prior to Admission medications   Medication Sig Start Date End Date Taking? Authorizing Provider  cyclobenzaprine (FLEXERIL) 10 MG tablet Take 1 tablet (10 mg total) by mouth 3 (three) times daily as needed for muscle spasms. 08/15/19  Yes Kim, Lance Kim, Kim  FIBER ADULT GUMMIES PO Take 2 tablets by mouth daily.    Yes [provider]  meloxicam (MOBIC) 7.5 MG tablet Take 2 tablets (15 mg total) by mouth daily. TAKE 2 TABLETS DAILY 08/01/19  Yes Kim, Lance Kim, Kim  amLODipine (NORVASC) 10 MG tablet TAKE 1 TABLET(10 MG) BY MOUTH DAILY 12/10/18   Kim, Lance Kim, Kim  Apremilast (OTEZLA) 30 MG TABS Take 1 tablet by mouth 2 (two) times daily.    [provider]  aspirin EC 81 MG tablet Take 81 mg by mouth daily.    [provider]  atorvastatin (LIPITOR) 20 MG tablet TAKE 1 TABLET(20 MG) BY MOUTH DAILY Patient taking differently: Take 10 mg by mouth daily.  06/14/19   Kim, Lance Screws Kim, Kim  benzonatate (TESSALON) 200 MG capsule Take 1 capsule (200 mg total) by mouth 2 (two) times daily as needed for cough. Patient not taking: Reported on 07/29/2019 07/11/19   Lance Kim P, DO  cetirizine (ZYRTEC) 10 MG tablet Take 1 tablet (10 mg total) by mouth daily. 04/04/19   Kim, Lance Screws Kim, Kim  FLUoxetine (PROZAC) 20 MG  capsule TAKE 1 CAPSULE(20 MG) BY MOUTH DAILY 07/18/19   Kim, Lance Kim, Kim  fluticasone (FLONASE) 50 MCG/ACT nasal spray USE 2 SPRAYS IN EACH NOSTRIL TWICE DAILY 02/02/19   Kim, Lance Kim, Kim  glucose blood test strip Use as instructed 08/19/18   Kim, Lance Kim, Kim  lisinopril-hydrochlorothiazide (ZESTORETIC) 20-12.5 MG tablet Take 1 tablet by mouth daily. 06/10/19   Lance Kim Kim, Kim  Multiple Vitamin (MULTIVITAMIN) tablet Take 2 tablets by mouth daily.     [provider]  ondansetron (ZOFRAN-ODT) 4 MG disintegrating tablet DISSOLVE 1 TABLET ON THE TONGUE EVERY 8 HOURS AS NEEDED FOR NAUSEA 05/28/19   Kim, Lance Kim, Kim  Triamcinolone Acetonide (TRIAMCINOLONE 0.1 % CREAM : EUCERIN) CREA Apply 1 application topically daily. Please supply one jar 08/19/18   Kim, Lance Faster, Kim  Lance Kim Lancets (Contour Choice)    [provider]    Allergies as of 08/25/2019 - Review Complete 08/16/2019  Allergen Reaction Noted  . Mevacor [lovastatin] Other (See Comments) 12/28/2014    Family History  Problem Relation Age of Onset  . Diabetes Mother   . Emphysema Father   . Cancer Son   . Cancer Maternal Grandmother   . Prostate cancer Neg Hx   . Bladder Cancer Neg Hx   . Kidney cancer Neg  Hx     Social History   Socioeconomic History  . Marital status: Married    Spouse name: Not on file  . Number of children: Not on file  . Years of education: Not on file  . Highest education level: Not on file  Occupational History  . Not on file  Tobacco Use  . Smoking status: Current Every Day Smoker    Packs/day: 0.50    Years: 25.00    Pack years: 12.50    Types: Cigarettes  . Smokeless tobacco: Never Used  Substance and Sexual Activity  . Alcohol use: Yes    Alcohol/week: 2.0 standard drinks    Types: 1 Glasses of wine, 1 Cans of beer per week    Comment: on occasion  . Drug use: No  . Sexual activity: Yes    Partners: Female    Comment: satidfied with  sexual function  Other Topics Concern  . Not on file  Social History Narrative  . Not on file   Social Determinants of Health   Financial Resource Strain:   . Difficulty of Paying Living Expenses: Not on file  Food Insecurity:   . Worried About Charity fundraiser in the Last Year: Not on file  . Ran Out of Food in the Last Year: Not on file  Transportation Needs:   . Lack of Transportation (Medical): Not on file  . Lack of Transportation (Non-Medical): Not on file  Physical Activity:   . Days of Exercise per Week: Not on file  . Minutes of Exercise per Session: Not on file  Stress:   . Feeling of Stress : Not on file  Social Connections:   . Frequency of Communication with Friends and Family: Not on file  . Frequency of Social Gatherings with Friends and Family: Not on file  . Attends Religious Services: Not on file  . Active Member of Clubs or Organizations: Not on file  . Attends Archivist Meetings: Not on file  . Marital Status: Not on file  Intimate Partner Violence:   . Fear of Current or Ex-Partner: Not on file  . Emotionally Abused: Not on file  . Physically Abused: Not on file  . Sexually Abused: Not on file    Review of Systems: See HPI, otherwise negative ROS  Physical Exam: BP 131/72   Pulse 72   Temp 97.7 F (36.5 C) (Temporal)   Resp 18   Ht 5\' 3"  (1.6 m)   Wt 93 kg   SpO2 100%   BMI 36.31 kg/m  General:   Alert,  pleasant and cooperative in NAD Head:  Normocephalic and atraumatic. Neck:  Supple; no masses or thyromegaly. Lungs:  Clear throughout to auscultation, normal respiratory effort.    Heart:  +S1, +S2, Regular rate and rhythm, No edema. Abdomen:  Soft, nontender and nondistended. Normal bowel sounds, without guarding, and without rebound.   Neurologic:  Alert and  oriented x4;  grossly normal neurologically.  Impression/Plan: Lance Kim is here for an colonoscopy to be performed for Screening colonoscopy average risk     Risks, benefits, limitations, and alternatives regarding  colonoscopy have been reviewed with the patient.  Questions have been answered.  All parties agreeable.   Lance Bellows, MD  09/02/2019, 10:17 AM

## 2019-09-02 NOTE — Anesthesia Post-op Follow-up Note (Signed)
Anesthesia QCDR form completed.        

## 2019-09-02 NOTE — Anesthesia Preprocedure Evaluation (Signed)
Anesthesia Evaluation  Patient identified by MRN, date of birth, ID band Patient awake    Reviewed: Allergy & Precautions, H&P , NPO status , Patient's Chart, lab work & pertinent test results, reviewed documented beta blocker date and time   Airway Mallampati: I  TM Distance: >3 FB Neck ROM: full    Dental  (+) Dental Advidsory Given, Teeth Intact   Pulmonary neg shortness of breath, sleep apnea and Continuous Positive Airway Pressure Ventilation , neg COPD, neg recent URI, Current Smoker,    Pulmonary exam normal        Cardiovascular Exercise Tolerance: Good hypertension, (-) angina(-) Past MI and (-) Cardiac Stents Normal cardiovascular exam(-) dysrhythmias (-) Valvular Problems/Murmurs     Neuro/Psych PSYCHIATRIC DISORDERS Anxiety Depression negative neurological ROS     GI/Hepatic negative GI ROS, Neg liver ROS,   Endo/Other  diabetes  Renal/GU Renal disease  negative genitourinary   Musculoskeletal   Abdominal   Peds  Hematology negative hematology ROS (+)   Anesthesia Other Findings Past Medical History: No date: Anxiety No date: Depression No date: Diabetes mellitus without complication ([facility]) No date: Hyperlipidemia No date: Hypertension No date: Hypertensive renal disease without failure No date: Tobacco use disorder   Reproductive/Obstetrics negative OB ROS                             Anesthesia Physical Anesthesia Plan  ASA: II  Anesthesia Plan: General   Post-op Pain Management:    Induction: Intravenous  PONV Risk Score and Plan: 1 and Propofol infusion and TIVA  Airway Management Planned: Natural Airway and Nasal Cannula  Additional Equipment:   Intra-op Plan:   Post-operative Plan:   Informed Consent: I have reviewed the patients History and Physical, chart, labs and discussed the procedure including the risks, benefits and alternatives for the proposed  anesthesia with the patient or authorized representative who has indicated his/her understanding and acceptance.     Dental Advisory Given  Plan Discussed with: Anesthesiologist, CRNA and Surgeon  Anesthesia Plan Comments:         Anesthesia Quick Evaluation

## 2019-09-05 ENCOUNTER — Encounter: Payer: Self-pay | Admitting: *Deleted

## 2019-09-05 LAB — SURGICAL PATHOLOGY

## 2019-09-17 ENCOUNTER — Other Ambulatory Visit: Payer: Self-pay | Admitting: Nurse Practitioner

## 2019-09-18 ENCOUNTER — Encounter: Payer: Self-pay | Admitting: Gastroenterology

## 2019-12-09 ENCOUNTER — Other Ambulatory Visit: Payer: Self-pay

## 2019-12-09 ENCOUNTER — Encounter: Payer: Self-pay | Admitting: Nurse Practitioner

## 2019-12-09 ENCOUNTER — Telehealth: Payer: Self-pay | Admitting: Nurse Practitioner

## 2019-12-09 ENCOUNTER — Ambulatory Visit (INDEPENDENT_AMBULATORY_CARE_PROVIDER_SITE_OTHER): Payer: BC Managed Care – PPO | Admitting: Nurse Practitioner

## 2019-12-09 VITALS — BP 123/79 | HR 79 | Temp 98.5°F | Ht 63.29 in | Wt 207.0 lb

## 2019-12-09 DIAGNOSIS — M722 Plantar fascial fibromatosis: Secondary | ICD-10-CM | POA: Insufficient documentation

## 2019-12-09 NOTE — Telephone Encounter (Signed)
Called pt and scheduled appt for today  Copied from [place] [phone]. Topic: General - Other >> Dec 09, 2019  9:15 AM Leward Quan A wrote: Reason for CRM: Patient called to inform Jolene  that he is having swelling in his ankles but more prominent in the left ankle. He states that he elevates and ice the ankle but it does not seem to help and is making walking very difficult. Patient would like a call back please to discuss what he can do.  Please call patient at  Ph# [phone]

## 2019-12-09 NOTE — Assessment & Plan Note (Signed)
Acute to L>R.  Will place referral to podiatry for further assessment and possible inserts as patient has been working out more via walking, has benefited from this with weight loss.  Recommend use of Tylenol as needed, minimal Ibuprofen due to HTN.  Recommend use of lacrosse ball or ice filled tennis ball to roll heel frequently at home.  Discussed need to rest, decrease walking at this time until improvement.  Return to office for worsening or ongoing symptoms.

## 2019-12-09 NOTE — Patient Instructions (Signed)
Plantar Fasciitis  Plantar fasciitis is a painful foot condition that affects the heel. It occurs when the band of tissue that connects the toes to the heel bone (plantar fascia) becomes irritated. This can happen as the result of exercising too much or doing other repetitive activities (overuse injury). The pain from plantar fasciitis can range from mild irritation to severe pain that makes it difficult to walk or move. The pain is usually worse in the morning after sleeping, or after sitting or lying down for a while. Pain may also be worse after long periods of walking or standing. What are the causes? This condition may be caused by:  Standing for long periods of time.  Wearing shoes that do not have good arch support.  Doing activities that put stress on joints (high-impact activities), including running, aerobics, and ballet.  Being overweight.  An abnormal way of walking (gait).  Tight muscles in the back of your lower leg (calf).  High arches in your feet.  Starting a new athletic activity. What are the signs or symptoms? The main symptom of this condition is heel pain. Pain may:  Be worse with first steps after a time of rest, especially in the morning after sleeping or after you have been sitting or lying down for a while.  Be worse after long periods of standing still.  Decrease after 30-45 minutes of activity, such as gentle walking. How is this diagnosed? This condition may be diagnosed based on your medical history and your symptoms. Your health care provider may ask questions about your activity level. Your health care provider will do a physical exam to check for:  A tender area on the bottom of your foot.  A high arch in your foot.  Pain when you move your foot.  Difficulty moving your foot. You may have imaging tests to confirm the diagnosis, such as:  X-rays.  Ultrasound.  MRI. How is this treated? Treatment for plantar fasciitis depends on how  severe your condition is. Treatment may include:  Rest, ice, applying pressure (compression), and raising the affected foot (elevation). This may be called RICE therapy. Your health care provider may recommend RICE therapy along with over-the-counter pain medicines to manage your pain.  Exercises to stretch your calves and your plantar fascia.  A splint that holds your foot in a stretched, upward position while you sleep (night splint).  Physical therapy to relieve symptoms and prevent problems in the future.  Injections of steroid medicine (cortisone) to relieve pain and inflammation.  Stimulating your plantar fascia with electrical impulses (extracorporeal shock wave therapy). This is usually the last treatment option before surgery.  Surgery, if other treatments have not worked after 12 months. Follow these instructions at home:  Managing pain, stiffness, and swelling  If directed, put ice on the painful area: ? Put ice in a plastic bag, or use a frozen bottle of water. ? Place a towel between your skin and the bag or bottle. ? Roll the bottom of your foot over the bag or bottle. ? Do this for 20 minutes, 2-3 times a day.  Wear athletic shoes that have air-sole or gel-sole cushions, or try wearing soft shoe inserts that are designed for plantar fasciitis.  Raise (elevate) your foot above the level of your heart while you are sitting or lying down. Activity  Avoid activities that cause pain. Ask your health care provider what activities are safe for you.  Do physical therapy exercises and stretches as told   by your health care provider.  Try activities and forms of exercise that are easier on your joints (low-impact). Examples include swimming, water aerobics, and biking. General instructions  Take over-the-counter and prescription medicines only as told by your health care provider.  Wear a night splint while sleeping, if told by your health care provider. Loosen the splint  if your toes tingle, become numb, or turn cold and blue.  Maintain a healthy weight, or work with your health care provider to lose weight as needed.  Keep all follow-up visits as told by your health care provider. This is important. Contact a health care provider if you:  Have symptoms that do not go away after caring for yourself at home.  Have pain that gets worse.  Have pain that affects your ability to move or do your daily activities. Summary  Plantar fasciitis is a painful foot condition that affects the heel. It occurs when the band of tissue that connects the toes to the heel bone (plantar fascia) becomes irritated.  The main symptom of this condition is heel pain that may be worse after exercising too much or standing still for a long time.  Treatment varies, but it usually starts with rest, ice, compression, and elevation (RICE therapy) and over-the-counter medicines to manage pain. This information is not intended to replace advice given to you by your health care provider. Make sure you discuss any questions you have with your health care provider. Document Revised: 08/14/2017 Document Reviewed: 06/29/2017 Elsevier Patient Education  2020 Elsevier Inc.  

## 2019-12-09 NOTE — Progress Notes (Signed)
BP 123/79 (BP Location: Left Arm, Patient Position: Sitting, Cuff Size: Normal)   Pulse 79   Temp 98.5 F (36.9 C) (Oral)   Ht 5' 3.29" (1.608 m)   Wt 207 lb (93.9 kg)   SpO2 98%   BMI 36.34 kg/m    Subjective:    Patient ID: Lance Kim, male    DOB: July 17, 1971, 49 y.o.   MRN: [id number]  HPI: Lance Kim is a 49 y.o. male  Chief Complaint  Patient presents with  . Edema    ankle   EDEMA: New onset.  Has underlying diabetes and HTN.  Current medications in clude Lipitor, Lisinopril-HCTZ, Amlodipine, Meloxicam.  No current diabetes medications as last A1C in November 5.6% with his weight loss.  Started to have edema on Tuesday and Wednesday.  Present to bilateral ankles and feet, L>R.  No changes in diet, no increase in sodium.  Uses alcohol occasionally, but not in past 2 weeks.  Blood sugars at home in evening 90-98 and in morning 90's.   He feels ankle swelling has been from standing on feet all day at work, not on mat he normally stands on and often he sits at work.  Plus past two weeks has been walking 19500 steps a day on average.  He feels this is contributing to edema.  When he sleeps at night sleeps on two pillows, this is baseline for him.  Denies orthopnea.  No history of Covid 19 and no current UTI symptoms.  Gets first Covid vaccine Sunday.  Reports some discomfort in ankle, took Ibuprofen, mostly in left ankle and heel and every now and then to right ankle.  Rates this as 6-7/10 in left ankle and heel, this is a throbbing/aching pain.  Constant pain in left ankle and bottom of foot, but intermittent pain in right.  Pain is in heel on left foot and notices it as soon as gets out of bed in morning.  Has had similar before, about 1 1/2 months ago.  Just got new shoes 2 1/2 weeks ago.  Has had plantar fasciitis in past.  Edema does not go away with rest or elevation.   Recurrent headaches: no Visual changes: no Palpitations: no Dyspnea: no Chest pain: no Lower  extremity edema: no Dizzy/lightheaded: no  Relevant past medical, surgical, family and social history reviewed and updated as indicated. Interim medical history since our last visit reviewed. Allergies and medications reviewed and updated.  Review of Systems  Constitutional: Negative for activity change, diaphoresis, fatigue and fever.  Respiratory: Negative for cough, chest tightness, shortness of breath and wheezing.   Cardiovascular: Positive for leg swelling. Negative for chest pain and palpitations.  Gastrointestinal: Negative.   Endocrine: Negative.   Musculoskeletal: Positive for arthralgias.  Neurological: Negative.   Psychiatric/Behavioral: Negative.     Per HPI unless specifically indicated above     Objective:    BP 123/79 (BP Location: Left Arm, Patient Position: Sitting, Cuff Size: Normal)   Pulse 79   Temp 98.5 F (36.9 C) (Oral)   Ht 5' 3.29" (1.608 m)   Wt 207 lb (93.9 kg)   SpO2 98%   BMI 36.34 kg/m   Wt Readings from Last 3 Encounters:  12/09/19 207 lb (93.9 kg)  09/02/19 205 lb (93 kg)  08/01/19 209 lb (94.8 kg)    Physical Exam Vitals and nursing note reviewed.  Constitutional:      General: He is awake. He is not in acute distress.  Appearance: He is well-developed. He is not ill-appearing.  HENT:     Head: Normocephalic.     Right Ear: Hearing normal. No drainage.     Left Ear: Hearing normal. No drainage.     Mouth/Throat:     Pharynx: Uvula midline.  Eyes:     General: Lids are normal.        Right eye: No discharge.        Left eye: No discharge.     Conjunctiva/sclera: Conjunctivae normal.     Pupils: Pupils are equal, round, and reactive to light.  Neck:     Vascular: No carotid bruit.  Cardiovascular:     Rate and Rhythm: Normal rate and regular rhythm.     Pulses:          Dorsalis pedis pulses are 2+ on the right side and 2+ on the left side.       Posterior tibial pulses are 2+ on the right side and 2+ on the left side.      Heart sounds: Normal heart sounds, S1 normal and S2 normal. No murmur. No gallop.      Comments: No edema to legs. Pulmonary:     Effort: Pulmonary effort is normal. No accessory muscle usage or respiratory distress.     Breath sounds: Normal breath sounds.  Abdominal:     General: Bowel sounds are normal.     Palpations: Abdomen is soft.  Musculoskeletal:        General: Normal range of motion.     Cervical back: Normal range of motion and neck supple.     Right lower leg: No edema.     Left lower leg: No edema.  Feet:     Right foot:     Skin integrity: Dry skin present. No skin breakdown or erythema.     Toenail Condition: Right toenails are normal.     Left foot:     Skin integrity: Dry skin present. No skin breakdown or erythema.     Toenail Condition: Left toenails are normal.     Comments: Mild edema to inner aspect bilateral ankles, no edema to shin or feet.  Normal ROM bilateral feet, although discomfort reported.  Is tender to palpation to pedal aspect left foot at heel.  No erythema or rashes noted. Flat foot bilaterally. Skin:    General: Skin is warm and dry.     Capillary Refill: Capillary refill takes less than 2 seconds.     Findings: No rash.  Neurological:     Mental Status: He is alert and oriented to person, place, and time.     Deep Tendon Reflexes: Reflexes are normal and symmetric.  Psychiatric:        Attention and Perception: Attention normal.        Mood and Affect: Mood normal.        Speech: Speech normal.        Behavior: Behavior normal. Behavior is cooperative.        Thought Content: Thought content normal.     Results for orders placed or performed during the hospital encounter of 09/02/19  Glucose, capillary  Result Value Ref Range   Glucose-Capillary 96 70 - 99 mg/dL  Surgical pathology  Result Value Ref Range   SURGICAL PATHOLOGY      SURGICAL PATHOLOGY CASE: [id number] PATIENT: Zeplin Regino Surgical Pathology  Report     Specimen Submitted: A. Colon polyp, sigmoid; cold snare  Clinical History: Screening colonoscopy.  Colon polyp     DIAGNOSIS: A.  COLON POLYP, SIGMOID; COLD SNARE: - TUBULAR ADENOMA. - NEGATIVE FOR HIGH-GRADE DYSPLASIA AND MALIGNANCY.  GROSS DESCRIPTION: A. Labeled: Sigmoid colon polyp cold snare Received: Formalin Tissue fragment(s): 1 Size: 1.0 x 0.3 x 0.2 cm Description: Tan soft tissue fragment Entirely submitted in 1 cassette.    Final Diagnosis performed by Bryan Lemma, MD.   Electronically signed 09/05/2019 2:53:22PM The electronic signature indicates that the named Attending Pathologist has evaluated the specimen Technical component performed at [facility], [address], [place], [place] [address] Lab: [phone] Dir: Rush Farmer, MD, MMM  Professional component performed at [facility], [facility], [place], [place],  [place] [address] Lab: [phone] Dir: Dellia Nims. Reuel Derby, MD       Assessment & Plan:   Problem List Items Addressed This Visit      Musculoskeletal and Integument   Plantar fasciitis - Primary    Acute to L>R.  Will place referral to podiatry for further assessment and possible inserts as patient has been working out more via walking, has benefited from this with weight loss.  Recommend use of Tylenol as needed, minimal Ibuprofen due to HTN.  Recommend use of lacrosse ball or ice filled tennis ball to roll heel frequently at home.  Discussed need to rest, decrease walking at this time until improvement.  Return to office for worsening or ongoing symptoms.      Relevant Orders   Ambulatory referral to Podiatry       Follow up plan: Return for as scheduled.

## 2019-12-09 NOTE — Telephone Encounter (Signed)
Noted, thank you

## 2019-12-11 ENCOUNTER — Ambulatory Visit: Payer: BC Managed Care – PPO | Attending: Internal Medicine

## 2019-12-11 DIAGNOSIS — Z23 Encounter for immunization: Secondary | ICD-10-CM

## 2019-12-11 NOTE — Progress Notes (Signed)
   Covid-19 Vaccination Clinic  Name:  Lance Kim    MRN: [id number] DOB: 11/02/1970  12/11/2019  [name] was observed post Covid-19 immunization for 15 minutes without incident. He was provided with Vaccine Information Sheet and instruction to access the V-Safe system.   [name] was instructed to call 911 with any severe reactions post vaccine: [name] Difficulty breathing  . Swelling of face and throat  . A fast heartbeat  . A bad rash all over body  . Dizziness and weakness   Immunizations Administered    Name Date Dose VIS Date Route   Pfizer COVID-19 Vaccine 12/11/2019  8:55 AM 0.3 mL 08/26/2019 Intramuscular   Manufacturer: [place]   Lot: [id number]   [place]: [id number]

## 2019-12-15 ENCOUNTER — Telehealth: Payer: Self-pay | Admitting: Nurse Practitioner

## 2019-12-15 ENCOUNTER — Other Ambulatory Visit: Payer: Self-pay | Admitting: Nurse Practitioner

## 2019-12-15 MED ORDER — ATORVASTATIN CALCIUM 20 MG PO TABS: ORAL_TABLET | ORAL | 4 refills | Status: DC

## 2019-12-15 NOTE — Telephone Encounter (Signed)
Copied from [place] [id number]. Topic: General - Other >> Dec 15, 2019  9:29 AM Keene Breath wrote: Reason for CRM: Patient would like the nurse or doctor to call regarding his diagnosis last week.   CB# [phone]

## 2019-12-15 NOTE — Telephone Encounter (Signed)
Spoke to patient on phone, he wanted to know if it was okay to use plantar fasciitis support brace.  Discussed with him this should be fine, wearing every other night and monitoring skin in morning.  He reports ice ball is working well.  Can not see podiatry yet due to cost, but plans on this in upcoming month or two.  Stated appreciation for return call.

## 2019-12-15 NOTE — Telephone Encounter (Signed)
Medication Refill - Medication: atorvastatin (LIPITOR) 20 MG tablet    Preferred Pharmacy (with phone number or street name):  [facility] [name], [facility] Phone:  [phone]  Fax:  [phone]       Agent: Please be advised that RX refills may take up to 3 business days. We ask that you follow-up with your pharmacy.

## 2019-12-15 NOTE — Telephone Encounter (Signed)
Routing to provider  

## 2019-12-27 ENCOUNTER — Other Ambulatory Visit: Payer: Self-pay | Admitting: Nurse Practitioner

## 2019-12-27 NOTE — Telephone Encounter (Signed)
Requested Prescriptions  Pending Prescriptions Disp Refills  . fluticasone (FLONASE) 50 MCG/ACT nasal spray [Pharmacy Med Name: FLUTICASONE 50MCG NASAL SP (120) RX] 16 g 1    Sig: SHAKE LIQUID AND USE 2 SPRAYS IN EACH NOSTRIL TWICE DAILY     Ear, Nose, and Throat: Nasal Preparations - Corticosteroids Passed - 12/27/2019  3:41 AM      Passed - Valid encounter within last 12 months    Recent Outpatient Visits          2 weeks ago Plantar fasciitis   [place], Jolene T, NP   4 months ago Type 2 diabetes mellitus without complication, without long-term current use of insulin ([place])   [place], [place] T, NP   5 months ago Upper respiratory tract infection, unspecified type   [employer], Megan P, DO   8 months ago Type 2 diabetes mellitus without complication, without long-term current use of insulin ([place])   [place], Jolene T, NP   11 months ago Type 2 diabetes mellitus without complication, without long-term current use of insulin ([place])   [place], Barbaraann Faster, NP      Future Appointments            In 1 month Cannady, Barbaraann Faster, NP [employer]

## 2020-01-03 ENCOUNTER — Ambulatory Visit: Payer: BC Managed Care – PPO | Attending: Internal Medicine

## 2020-01-03 DIAGNOSIS — Z23 Encounter for immunization: Secondary | ICD-10-CM

## 2020-01-03 NOTE — Progress Notes (Signed)
   Covid-19 Vaccination Clinic  Name:  Lance Kim    MRN: [id number] DOB: 1970-10-02  01/03/2020  Lance Kim was observed post Covid-19 immunization for 15 minutes without incident. He was provided with Vaccine Information Sheet and instruction to access the V-Safe system.   Lance Kim was instructed to call 911 with any severe reactions post vaccine: [name] Difficulty breathing  . Swelling of face and throat  . A fast heartbeat  . A bad rash all over body  . Dizziness and weakness   Immunizations Administered    Name Date Dose VIS Date Route   Pfizer COVID-19 Vaccine 01/03/2020  2:21 PM 0.3 mL 11/09/2018 Intramuscular   Manufacturer: [place]   Lot: [id number]   [place]: [id number]

## 2020-02-02 DIAGNOSIS — L400 Psoriasis vulgaris: Secondary | ICD-10-CM | POA: Diagnosis not present

## 2020-02-02 DIAGNOSIS — Z79899 Other long term (current) drug therapy: Secondary | ICD-10-CM | POA: Diagnosis not present

## 2020-02-03 ENCOUNTER — Ambulatory Visit: Payer: BC Managed Care – PPO | Admitting: Nurse Practitioner

## 2020-02-06 ENCOUNTER — Ambulatory Visit: Payer: BC Managed Care – PPO | Admitting: Nurse Practitioner

## 2020-02-06 DIAGNOSIS — E669 Obesity, unspecified: Secondary | ICD-10-CM

## 2020-02-06 DIAGNOSIS — E1169 Type 2 diabetes mellitus with other specified complication: Secondary | ICD-10-CM | POA: Insufficient documentation

## 2020-02-06 HISTORY — DX: Obesity, unspecified: E66.9

## 2020-02-14 ENCOUNTER — Other Ambulatory Visit: Payer: Self-pay | Admitting: Nurse Practitioner

## 2020-02-14 MED ORDER — CYCLOBENZAPRINE HCL 10 MG PO TABS: 10.0000 mg | ORAL_TABLET | Freq: Three times a day (TID) | ORAL | 0 refills | Status: DC | PRN

## 2020-02-14 NOTE — Telephone Encounter (Signed)
Requested medication (s) are due for refill today: yes  Requested medication (s) are on the active medication list: yes  Last refill:  08/15/19  Future visit scheduled: yes in 1 week  Notes to clinic:  Medication not delegated to NT to RF   Requested Prescriptions  Pending Prescriptions Disp Refills   cyclobenzaprine (FLEXERIL) 10 MG tablet 30 tablet 0    Sig: Take 1 tablet (10 mg total) by mouth 3 (three) times daily as needed for muscle spasms.      Not Delegated - Analgesics:  Muscle Relaxants Failed - 02/14/2020 10:23 AM      Failed - This refill cannot be delegated      Passed - Valid encounter within last 6 months    Recent Outpatient Visits           2 months ago Plantar fasciitis   [place], Jolene T, NP   6 months ago Type 2 diabetes mellitus without complication, without long-term current use of insulin ([place])   [place], [place] T, NP   7 months ago Upper respiratory tract infection, unspecified type   [employer], Megan P, DO   9 months ago Type 2 diabetes mellitus without complication, without long-term current use of insulin ([place])   [place], [place] T, NP   1 year ago Type 2 diabetes mellitus without complication, without long-term current use of insulin ([place])   [place], Barbaraann Faster, NP       Future Appointments             In 1 week Cannady, Barbaraann Faster, NP [employer]

## 2020-02-14 NOTE — Telephone Encounter (Signed)
Copied from [place] [id number]. Topic: Quick Communication - Rx Refill/Question >> Feb 14, 2020 10:20 AM Rainey Pines A wrote: Medication: cyclobenzaprine (FLEXERIL) 10 MG tablet  Has the patient contacted their pharmacy? Yes  (Agent: If no, request that the patient contact the pharmacy for the refill.) (Agent: If yes, when and what did the pharmacy advise?)contact pcp  Preferred Pharmacy (with phone number or street name): [facility] [id number] [name], [place] [facility]  Phone:  [phone] Fax:  [phone]    Agent: Please be advised that RX refills may take up to 3 business days. We ask that you follow-up with your pharmacy.

## 2020-02-14 NOTE — Telephone Encounter (Signed)
Routing to provider  

## 2020-02-21 ENCOUNTER — Encounter: Payer: Self-pay | Admitting: Nurse Practitioner

## 2020-02-21 ENCOUNTER — Other Ambulatory Visit: Payer: Self-pay

## 2020-02-21 ENCOUNTER — Ambulatory Visit: Payer: BC Managed Care – PPO | Admitting: Nurse Practitioner

## 2020-02-21 VITALS — BP 121/83 | HR 97 | Temp 98.3°F | Wt 212.6 lb

## 2020-02-21 DIAGNOSIS — M2141 Flat foot [pes planus] (acquired), right foot: Secondary | ICD-10-CM | POA: Diagnosis not present

## 2020-02-21 DIAGNOSIS — M722 Plantar fascial fibromatosis: Secondary | ICD-10-CM

## 2020-02-21 DIAGNOSIS — E669 Obesity, unspecified: Secondary | ICD-10-CM | POA: Diagnosis not present

## 2020-02-21 DIAGNOSIS — F17210 Nicotine dependence, cigarettes, uncomplicated: Secondary | ICD-10-CM | POA: Insufficient documentation

## 2020-02-21 DIAGNOSIS — E1169 Type 2 diabetes mellitus with other specified complication: Secondary | ICD-10-CM | POA: Diagnosis not present

## 2020-02-21 DIAGNOSIS — M2140 Flat foot [pes planus] (acquired), unspecified foot: Secondary | ICD-10-CM | POA: Insufficient documentation

## 2020-02-21 DIAGNOSIS — E1159 Type 2 diabetes mellitus with other circulatory complications: Secondary | ICD-10-CM

## 2020-02-21 DIAGNOSIS — I152 Hypertension secondary to endocrine disorders: Secondary | ICD-10-CM

## 2020-02-21 DIAGNOSIS — I10 Essential (primary) hypertension: Secondary | ICD-10-CM | POA: Diagnosis not present

## 2020-02-21 DIAGNOSIS — M2142 Flat foot [pes planus] (acquired), left foot: Secondary | ICD-10-CM

## 2020-02-21 DIAGNOSIS — E785 Hyperlipidemia, unspecified: Secondary | ICD-10-CM

## 2020-02-21 NOTE — Assessment & Plan Note (Signed)
Would benefit from orthotics and foot exam, referral to podiatry placed.

## 2020-02-21 NOTE — Assessment & Plan Note (Signed)
Chronic, stable with A1C today 5.3% remaining off Metformin and maintaining diet + weight & urine ALB 10.  Praised him for maintaining weight loss.  Will continue off medication at this time and continue diet focus.  Return in 4 months for annual physical.

## 2020-02-21 NOTE — Patient Instructions (Signed)
Plantar Fasciitis  Plantar fasciitis is a painful foot condition that affects the heel. It occurs when the band of tissue that connects the toes to the heel bone (plantar fascia) becomes irritated. This can happen as the result of exercising too much or doing other repetitive activities (overuse injury). The pain from plantar fasciitis can range from mild irritation to severe pain that makes it difficult to walk or move. The pain is usually worse in the morning after sleeping, or after sitting or lying down for a while. Pain may also be worse after long periods of walking or standing. What are the causes? This condition may be caused by:  Standing for long periods of time.  Wearing shoes that do not have good arch support.  Doing activities that put stress on joints (high-impact activities), including running, aerobics, and ballet.  Being overweight.  An abnormal way of walking (gait).  Tight muscles in the back of your lower leg (calf).  High arches in your feet.  Starting a new athletic activity. What are the signs or symptoms? The main symptom of this condition is heel pain. Pain may:  Be worse with first steps after a time of rest, especially in the morning after sleeping or after you have been sitting or lying down for a while.  Be worse after long periods of standing still.  Decrease after 30-45 minutes of activity, such as gentle walking. How is this diagnosed? This condition may be diagnosed based on your medical history and your symptoms. Your health care provider may ask questions about your activity level. Your health care provider will do a physical exam to check for:  A tender area on the bottom of your foot.  A high arch in your foot.  Pain when you move your foot.  Difficulty moving your foot. You may have imaging tests to confirm the diagnosis, such as:  X-rays.  Ultrasound.  MRI. How is this treated? Treatment for plantar fasciitis depends on how  severe your condition is. Treatment may include:  Rest, ice, applying pressure (compression), and raising the affected foot (elevation). This may be called RICE therapy. Your health care provider may recommend RICE therapy along with over-the-counter pain medicines to manage your pain.  Exercises to stretch your calves and your plantar fascia.  A splint that holds your foot in a stretched, upward position while you sleep (night splint).  Physical therapy to relieve symptoms and prevent problems in the future.  Injections of steroid medicine (cortisone) to relieve pain and inflammation.  Stimulating your plantar fascia with electrical impulses (extracorporeal shock wave therapy). This is usually the last treatment option before surgery.  Surgery, if other treatments have not worked after 12 months. Follow these instructions at home:  Managing pain, stiffness, and swelling  If directed, put ice on the painful area: ? Put ice in a plastic bag, or use a frozen bottle of water. ? Place a towel between your skin and the bag or bottle. ? Roll the bottom of your foot over the bag or bottle. ? Do this for 20 minutes, 2-3 times a day.  Wear athletic shoes that have air-sole or gel-sole cushions, or try wearing soft shoe inserts that are designed for plantar fasciitis.  Raise (elevate) your foot above the level of your heart while you are sitting or lying down. Activity  Avoid activities that cause pain. Ask your health care provider what activities are safe for you.  Do physical therapy exercises and stretches as told   by your health care provider.  Try activities and forms of exercise that are easier on your joints (low-impact). Examples include swimming, water aerobics, and biking. General instructions  Take over-the-counter and prescription medicines only as told by your health care provider.  Wear a night splint while sleeping, if told by your health care provider. Loosen the splint  if your toes tingle, become numb, or turn cold and blue.  Maintain a healthy weight, or work with your health care provider to lose weight as needed.  Keep all follow-up visits as told by your health care provider. This is important. Contact a health care provider if you:  Have symptoms that do not go away after caring for yourself at home.  Have pain that gets worse.  Have pain that affects your ability to move or do your daily activities. Summary  Plantar fasciitis is a painful foot condition that affects the heel. It occurs when the band of tissue that connects the toes to the heel bone (plantar fascia) becomes irritated.  The main symptom of this condition is heel pain that may be worse after exercising too much or standing still for a long time.  Treatment varies, but it usually starts with rest, ice, compression, and elevation (RICE therapy) and over-the-counter medicines to manage pain. This information is not intended to replace advice given to you by your health care provider. Make sure you discuss any questions you have with your health care provider. Document Revised: 08/14/2017 Document Reviewed: 06/29/2017 Elsevier Patient Education  2020 Elsevier Inc.  

## 2020-02-21 NOTE — Assessment & Plan Note (Signed)
Chronic, ongoing.  Continue current medication regimen and adjust as needed. Lipid panel today. 

## 2020-02-21 NOTE — Progress Notes (Signed)
BP 121/83   Pulse 97   Temp 98.3 F (36.8 C) (Oral)   Wt 212 lb 9.6 oz (96.4 kg)   SpO2 98%   BMI 37.32 kg/m    Subjective:    Patient ID: Lance Kim, male    DOB: 31-May-1971, 49 y.o.   MRN: [id number]  HPI: HANSFORD HIRT is a 49 y.o. male  Chief Complaint  Patient presents with  . Depression  . Hypertension  . Foot Pain    pt states he has been having really bad right foot pain since Thursday   DIABETES Last visit A1C 5.6% and continues off medication at this time. Had significant weight loss in past and is attempting to maintain, has had some gain. Hypoglycemic episodes:no Polydipsia/polyuria: no Visual disturbance: no Chest pain: no Paresthesias: no Glucose Monitoring: no  Accucheck frequency: Daily  Fasting glucose: 90 to 100  Post prandial:  Evening: 90 to 100  Before meals: Taking Insulin?: no  Long acting insulin:  Short acting insulin: Blood Pressure Monitoring: not checking Retinal Examination: Not up to Date Foot Exam: Up to Date Pneumovax: Up to Date Influenza: Up to Date Aspirin: yes   HYPERTENSION / HYPERLIPIDEMIA Continues on Lisinopril-HCTZ, Amlodipine, and Atorvastatin, ASA.  Currently smoking 1/2 to 1 PPD, does not wish to quit.  He started smoking at age 42.  Is interested in initiating lung CT screening. Satisfied with current treatment? yes Duration of hypertension: chronic BP monitoring frequency: not checking BP range:  BP medication side effects: no Duration of hyperlipidemia: chronic Cholesterol medication side effects: no Cholesterol supplements: none Medication compliance: good compliance Aspirin: yes Recent stressors: no Recurrent headaches: no Visual changes: no Palpitations: no Dyspnea: no Chest pain: no Lower extremity edema: no Dizzy/lightheaded: no   FOOT PAIN Initially had plantar fascititis to left foot in March 2021 and now present to right heel, which started last Thursday after walking extra miles.  Is  rolling heel with ice ball and performing maneuvers that worked last time.  With left foot it took 2-3 days of continuous treatment + rest for it to improve. No recent injuries or falls.  Would like to see podiatry. Duration: weeks Involved foot: right Mechanism of injury: unknown Location: right heel Onset: gradual  Severity: 9/10  Quality: sharp to dull Frequency: intermittent Radiation: no Aggravating factors: weight bearing, walking and movement  Alleviating factors: ice, APAP and rest  Status: fluctuating Treatments attempted: rest, ice and APAP  Relief with NSAIDs?:  No NSAIDs Taken Weakness with weight bearing or walking: no Morning stiffness: yes Swelling: no Redness: no Bruising: no Paresthesias / decreased sensation: no  Fevers:no  Relevant past medical, surgical, family and social history reviewed and updated as indicated. Interim medical history since our last visit reviewed. Allergies and medications reviewed and updated.  Review of Systems  Constitutional: Negative for activity change, diaphoresis, fatigue and fever.  Respiratory: Negative for cough, chest tightness, shortness of breath and wheezing.   Cardiovascular: Negative for chest pain, palpitations and leg swelling.  Gastrointestinal: Negative.   Endocrine: Negative.   Musculoskeletal: Positive for arthralgias.  Neurological: Negative.   Psychiatric/Behavioral: Negative.     Per HPI unless specifically indicated above     Objective:    BP 121/83   Pulse 97   Temp 98.3 F (36.8 C) (Oral)   Wt 212 lb 9.6 oz (96.4 kg)   SpO2 98%   BMI 37.32 kg/m   Wt Readings from Last 3 Encounters:  02/21/20 212  lb 9.6 oz (96.4 kg)  12/09/19 207 lb (93.9 kg)  09/02/19 205 lb (93 kg)    Physical Exam Vitals and nursing note reviewed.  Constitutional:      General: He is awake. He is not in acute distress.    Appearance: He is well-developed. He is not ill-appearing.  HENT:     Head: Normocephalic.      Right Ear: Hearing normal. No drainage.     Left Ear: Hearing normal. No drainage.     Mouth/Throat:     Pharynx: Uvula midline.  Eyes:     General: Lids are normal.        Right eye: No discharge.        Left eye: No discharge.     Conjunctiva/sclera: Conjunctivae normal.     Pupils: Pupils are equal, round, and reactive to light.  Neck:     Vascular: No carotid bruit.  Cardiovascular:     Rate and Rhythm: Normal rate and regular rhythm.     Pulses:          Dorsalis pedis pulses are 2+ on the right side and 2+ on the left side.       Posterior tibial pulses are 2+ on the right side and 2+ on the left side.     Heart sounds: Normal heart sounds, S1 normal and S2 normal. No murmur. No gallop.      Comments: No edema to legs. Pulmonary:     Effort: Pulmonary effort is normal. No accessory muscle usage or respiratory distress.     Breath sounds: Normal breath sounds.  Abdominal:     General: Bowel sounds are normal.     Palpations: Abdomen is soft.  Musculoskeletal:        General: Normal range of motion.     Cervical back: Normal range of motion and neck supple.     Right lower leg: No edema.     Left lower leg: No edema.  Feet:     Right foot:     Skin integrity: Dry skin present. No skin breakdown or erythema.     Toenail Condition: Right toenails are normal.     Left foot:     Skin integrity: Dry skin present. No skin breakdown or erythema.     Toenail Condition: Left toenails are normal.     Comments: No edema.  Normal ROM bilateral feet, although discomfort reported.  Is tender to palpation to pedal aspect right foot at heel.  No erythema or rashes noted. Pes Planus bilaterally. Skin:    General: Skin is warm and dry.     Capillary Refill: Capillary refill takes less than 2 seconds.     Findings: No rash.  Neurological:     Mental Status: He is alert and oriented to person, place, and time.     Deep Tendon Reflexes: Reflexes are normal and symmetric.  Psychiatric:         Attention and Perception: Attention normal.        Mood and Affect: Mood normal.        Speech: Speech normal.        Behavior: Behavior normal. Behavior is cooperative.        Thought Content: Thought content normal.    Diabetic Foot Exam - Simple   No data filed      Results for orders placed or performed during the hospital encounter of 09/02/19  Glucose, capillary  Result Value Ref Range  Glucose-Capillary 96 70 - 99 mg/dL  Surgical pathology  Result Value Ref Range   SURGICAL PATHOLOGY      SURGICAL PATHOLOGY CASE: [id number] PATIENT: Vinayak Poster Surgical Pathology Report     Specimen Submitted: A. Colon polyp, sigmoid; cold snare  Clinical History: Screening colonoscopy.  Colon polyp     DIAGNOSIS: A.  COLON POLYP, SIGMOID; COLD SNARE: - TUBULAR ADENOMA. - NEGATIVE FOR HIGH-GRADE DYSPLASIA AND MALIGNANCY.  GROSS DESCRIPTION: A. Labeled: Sigmoid colon polyp cold snare Received: Formalin Tissue fragment(s): 1 Size: 1.0 x 0.3 x 0.2 cm Description: Tan soft tissue fragment Entirely submitted in 1 cassette.    Final Diagnosis performed by Bryan Lemma, MD.   Electronically signed 09/05/2019 2:53:22PM The electronic signature indicates that the named Attending Pathologist has evaluated the specimen Technical component performed at [facility], [address], [place], [place] [address] Lab: [id number] Dir: Rush Farmer, MD, MMM  Professional component performed at [facility], [facility], [place], [place],  [place] [address] Lab: [phone] Dir: Dellia Nims. Rubinas, MD       Assessment & Plan:   Problem List Items Addressed This Visit      Cardiovascular and Mediastinum   Hypertension associated with type 2 diabetes mellitus ([place])    Chronic, stable with A1C today 5.3% remaining off Metformin and maintaining diet + weight and BP remains under goal.  Praised him for ongoing maintenance of weight.  Will continue current BP  medications and adjust as needed.  BMP today.  Return in 4 months.      Relevant Orders   Microalbumin, Urine Waived   Basic metabolic panel     Endocrine   Hyperlipidemia associated with type 2 diabetes mellitus ([facility])    Chronic, ongoing.  Continue current medication regimen and adjust as needed.  Lipid panel today.       Relevant Orders   Lipid Panel w/o Chol/HDL Ratio   Type 2 diabetes mellitus with obesity ([facility]) - Primary    Chronic, stable with A1C today 5.3% remaining off Metformin and maintaining diet + weight & urine ALB 10.  Praised him for maintaining weight loss.  Will continue off medication at this time and continue diet focus.  Return in 4 months for annual physical.      Relevant Orders   Bayer DCA Hb A1c Waived   Basic metabolic panel     Musculoskeletal and Integument   Plantar fasciitis of right foot    Acute to right heel.  Will place referral to podiatry for further assessment and possible inserts as patient has been working out more via walking, has benefited from this with weight loss.  Recommend use of Tylenol as needed, minimal Ibuprofen due to HTN.  Recommend use of lacrosse ball or ice filled tennis ball to roll heel frequently at home.  Discussed need to rest, decrease walking at this time until improvement.  Obtain imaging of foot.  Return to office for worsening or ongoing symptoms.      Relevant Orders   Ambulatory referral to Podiatry   DG Foot Complete Right     Other   Flat foot    Would benefit from orthotics and foot exam, referral to podiatry placed.      Relevant Orders   Ambulatory referral to Podiatry   DG Foot Complete Right   Cigarette nicotine dependence without complication    I have recommended complete cessation of tobacco use. I have discussed various options available for assistance with tobacco cessation  including over the counter methods (Nicotine gum, patch and lozenges). We also discussed prescription options (Chantix,  Nicotine Inhaler / Nasal Spray). The patient is not interested in pursuing any prescription tobacco cessation options at this time.  Referral for lung CA screening placed.      Relevant Orders   Ambulatory Referral for Lung Cancer Scre       Follow up plan: Return in about 3 months (around 05/23/2020) for annual physical.

## 2020-02-21 NOTE — Assessment & Plan Note (Signed)
Chronic, stable with A1C today 5.3% remaining off Metformin and maintaining diet + weight and BP remains under goal.  Praised him for ongoing maintenance of weight.  Will continue current BP medications and adjust as needed.  BMP today.  Return in 4 months.

## 2020-02-21 NOTE — Assessment & Plan Note (Signed)
I have recommended complete cessation of tobacco use. I have discussed various options available for assistance with tobacco cessation including over the counter methods (Nicotine gum, patch and lozenges). We also discussed prescription options (Chantix, Nicotine Inhaler / Nasal Spray). The patient is not interested in pursuing any prescription tobacco cessation options at this time.  Referral for lung CA screening placed.

## 2020-02-21 NOTE — Assessment & Plan Note (Signed)
Acute to right heel.  Will place referral to podiatry for further assessment and possible inserts as patient has been working out more via walking, has benefited from this with weight loss.  Recommend use of Tylenol as needed, minimal Ibuprofen due to HTN.  Recommend use of lacrosse ball or ice filled tennis ball to roll heel frequently at home.  Discussed need to rest, decrease walking at this time until improvement.  Obtain imaging of foot.  Return to office for worsening or ongoing symptoms.

## 2020-02-22 LAB — BASIC METABOLIC PANEL
BUN/Creatinine Ratio: 14 (ref 9–20)
BUN: 14 mg/dL (ref 6–24)
CO2: 22 mmol/L (ref 20–29)
Calcium: 9.2 mg/dL (ref 8.7–10.2)
Chloride: 104 mmol/L (ref 96–106)
Creatinine, Ser: 1.01 mg/dL (ref 0.76–1.27)
GFR calc Af Amer: 100 mL/min/{1.73_m2} (ref >59)
GFR calc non Af Amer: 86 mL/min/{1.73_m2} (ref >59)
Glucose: 73 mg/dL (ref 65–99)
Potassium: 4.3 mmol/L (ref 3.5–5.2)
Sodium: 140 mmol/L (ref 134–144)

## 2020-02-22 LAB — LIPID PANEL W/O CHOL/HDL RATIO
Cholesterol, Total: 112 mg/dL (ref 100–199)
HDL: 43 mg/dL (ref >39)
LDL Chol Calc (NIH): 56 mg/dL (ref 0–99)
Triglycerides: 55 mg/dL (ref 0–149)
VLDL Cholesterol Cal: 13 mg/dL (ref 5–40)

## 2020-02-22 LAB — MICROALBUMIN, URINE WAIVED
Creatinine, Urine Waived: 100 mg/dL (ref 10–300)
Microalb, Ur Waived: 10 mg/L (ref 0–19)
Microalb/Creat Ratio: <30 mg/g (ref <30)

## 2020-02-22 LAB — BAYER DCA HB A1C WAIVED: HB A1C (BAYER DCA - WAIVED): 5.3 % (ref <7.0)

## 2020-02-22 NOTE — Progress Notes (Signed)
Contacted via [place] morning Darin, your labs have returned and kidney function + electrolytes are normal + cholesterol levels are at goal.  Let me know if you do not hear from podiatry.  Have a great day!! Keep being awesome!! Kindest regards, Jaevian Shean

## 2020-03-08 ENCOUNTER — Telehealth: Payer: Self-pay | Admitting: *Deleted

## 2020-03-08 NOTE — Telephone Encounter (Signed)
Received referral for low dose lung cancer screening CT scan. Message left at phone number listed in EMR for patient to call me back to facilitate scheduling scan.  

## 2020-03-11 ENCOUNTER — Other Ambulatory Visit: Payer: Self-pay | Admitting: Nurse Practitioner

## 2020-03-11 NOTE — Telephone Encounter (Signed)
Requested Prescriptions  Pending Prescriptions Disp Refills  . cetirizine (ZYRTEC) 10 MG tablet [Pharmacy Med Name: CETIRIZINE 10MG  TABLETS] 90 tablet 3    Sig: TAKE 1 TABLET(10 MG) BY MOUTH DAILY     Ear, Nose, and Throat:  Antihistamines Passed - 03/11/2020  3:38 AM      Passed - Valid encounter within last 12 months    Recent Outpatient Visits          2 weeks ago Type 2 diabetes mellitus with obesity ([place])   [place], [place] T, NP   3 months ago Plantar fasciitis   [place], Jolene T, NP   7 months ago Type 2 diabetes mellitus without complication, without long-term current use of insulin ([place])   [place], [place] T, NP   8 months ago Upper respiratory tract infection, unspecified type   [employer], Megan P, DO   10 months ago Type 2 diabetes mellitus without complication, without long-term current use of insulin ([place])   [place], Barbaraann Faster, NP      Future Appointments            In 2 months [place], Barbaraann Faster, NP [employer], [facility]

## 2020-03-13 ENCOUNTER — Encounter: Payer: Self-pay | Admitting: *Deleted

## 2020-03-13 ENCOUNTER — Ambulatory Visit (INDEPENDENT_AMBULATORY_CARE_PROVIDER_SITE_OTHER): Payer: BC Managed Care – PPO

## 2020-03-13 ENCOUNTER — Other Ambulatory Visit: Payer: Self-pay

## 2020-03-13 ENCOUNTER — Ambulatory Visit: Payer: BC Managed Care – PPO | Admitting: Podiatry

## 2020-03-13 DIAGNOSIS — M722 Plantar fascial fibromatosis: Secondary | ICD-10-CM

## 2020-03-13 DIAGNOSIS — M79672 Pain in left foot: Secondary | ICD-10-CM

## 2020-03-13 DIAGNOSIS — M2141 Flat foot [pes planus] (acquired), right foot: Secondary | ICD-10-CM

## 2020-03-13 DIAGNOSIS — M2142 Flat foot [pes planus] (acquired), left foot: Secondary | ICD-10-CM

## 2020-03-13 DIAGNOSIS — M79671 Pain in right foot: Secondary | ICD-10-CM

## 2020-03-13 NOTE — Progress Notes (Signed)
   Subjective:  49 y.o. male presenting today for evaluation of collapsed arches and flat feet with associated heel pain to the bilateral feet.  Patient states that he has been noticing a constant ache and burning to the bilateral heels has been going on for the past few months.  Aggravated by walking in shoes.  He is currently not done anything for treatment.  He was diagnosed by his PCP with plantar fasciitis.   Past Medical History:  Diagnosis Date  . Anxiety   . Depression   . Diabetes mellitus without complication ([place])   . Hyperlipidemia   . Hypertension   . Hypertensive renal disease without failure   . Tobacco use disorder        Objective/Physical Exam General: The patient is alert and oriented x3 in no acute distress.  Dermatology: Skin is warm, dry and supple bilateral lower extremities. Negative for open lesions or macerations.  Vascular: Palpable pedal pulses bilaterally. No edema or erythema noted. Capillary refill within normal limits.  Neurological: Epicritic and protective threshold grossly intact bilaterally.   Musculoskeletal Exam: Range of motion within normal limits to all pedal and ankle joints bilateral. Muscle strength 5/5 in all groups bilateral.  Upon weightbearing there is a medial longitudinal arch collapse bilaterally. Remove foot valgus noted to the bilateral lower extremities with excessive pronation upon mid stance.  There is some tenderness to palpation noted to the plantar heels bilaterally.  Radiographic Exam:  Normal osseous mineralization. Joint spaces preserved. No fracture/dislocation/boney destruction.   Pes planus noted on radiographic exam lateral views. Decreased calcaneal inclination and metatarsal declination angle is noted. Anterior break in the cyma line noted on lateral views. Medial talar head to deviation noted on AP radiograph.   Assessment: 1. pes planus bilateral 2.  Plantar fasciitis bilateral   Plan of Care:  1. Patient  was evaluated. X-Rays reviewed.  2.  Patient declined injections today.  Continue meloxicam as per PCP 3.  Continue OTC insoles since they helped significantly to reduce the patient's pain 4.  Recommend good supportive shoes at [facility] in [place].  Patient states that he does a lot of walking and he needs good supportive shoes 5.  Return to clinic as needed   Edrick Kins, DPM [facility]  Dr. Edrick Kins, [place]                                        [place],  [address]                Office [phone]  Fax [phone]

## 2020-03-13 NOTE — Telephone Encounter (Signed)
Spoke with patient about the lung screening eligibility requirements and insurance coverage issues with new recommendations.   Will ask billing to inquire about coverage for < 50 years of age and < 30 pack year.   Patient will inquire as well from his insurance.   We will schedule when covered.

## 2020-03-15 NOTE — Telephone Encounter (Signed)
Per business office insurance has approved lung screening for Lance Kim with new eligibility recommendations. Left voicemail in attempt to schedule.

## 2020-04-02 ENCOUNTER — Telehealth: Payer: Self-pay | Admitting: Nurse Practitioner

## 2020-04-02 ENCOUNTER — Other Ambulatory Visit: Payer: Self-pay | Admitting: *Deleted

## 2020-04-02 DIAGNOSIS — Z87891 Personal history of nicotine dependence: Secondary | ICD-10-CM

## 2020-04-02 DIAGNOSIS — Z122 Encounter for screening for malignant neoplasm of respiratory organs: Secondary | ICD-10-CM

## 2020-04-02 NOTE — Telephone Encounter (Signed)
Copied from [place] [id number]. Topic: General - Other >> Mar 30, 2020 10:44 AM Celene Kras wrote: Reason for CRM: Pt called and is requesting to have a nurse give him a call back regarding his throat hurting pt states that he is concerned it may be due to E Coli in water. Please advise.

## 2020-04-02 NOTE — Telephone Encounter (Signed)
Returned call to patient. Patient states that his sore throat improved on Saturday. Patient states that last week he was gargling with salt water  and was concerned about E-coli causing the sore throat. Patient informed that the most common symptoms of E-coli exposure were nausea, vomiting, diarrhea and stomach cramping or pain. Patient denies any of the symptoms mentioned above at this time. Patient states that he is fully vaccinated for COVID-19. Patient advised if sore throat returns or any other symptoms develop to return the call to the office. Patient verbalized understanding.

## 2020-04-05 ENCOUNTER — Other Ambulatory Visit: Payer: Self-pay | Admitting: Podiatry

## 2020-04-05 DIAGNOSIS — M2141 Flat foot [pes planus] (acquired), right foot: Secondary | ICD-10-CM

## 2020-04-13 ENCOUNTER — Inpatient Hospital Stay: Payer: BC Managed Care – PPO | Attending: Oncology | Admitting: Hospice and Palliative Medicine

## 2020-04-13 ENCOUNTER — Ambulatory Visit
Admission: RE | Admit: 2020-04-13 | Discharge: 2020-04-13 | Disposition: A | Discharge status: Home / Self Care | Payer: BC Managed Care – PPO | Source: Ambulatory Visit | Attending: Oncology | Admitting: Oncology

## 2020-04-13 ENCOUNTER — Other Ambulatory Visit: Payer: Self-pay

## 2020-04-13 DIAGNOSIS — Z87891 Personal history of nicotine dependence: Secondary | ICD-10-CM

## 2020-04-13 DIAGNOSIS — F17210 Nicotine dependence, cigarettes, uncomplicated: Secondary | ICD-10-CM | POA: Diagnosis not present

## 2020-04-13 DIAGNOSIS — Z122 Encounter for screening for malignant neoplasm of respiratory organs: Secondary | ICD-10-CM | POA: Insufficient documentation

## 2020-04-13 NOTE — Progress Notes (Signed)
Virtual Visit via Video Note  I connected with@ on 04/13/20 at@ by a video enabled telemedicine application and verified that I am speaking with the correct person using two identifiers.   I discussed the limitations of evaluation and management by telemedicine and the availability of in person appointments. The patient expressed understanding and agreed to proceed.  In accordance with CMS guidelines, patient has met eligibility criteria including age, absence of signs or symptoms of lung cancer.  Social History   Tobacco Use  . Smoking status: Current Every Day Smoker    Packs/day: 0.75    Years: 32.00    Pack years: 24.00    Types: Cigarettes  . Smokeless tobacco: Never Used  . Tobacco comment: .5ppd now  Vaping Use  . Vaping Use: Never used  Substance Use Topics  . Alcohol use: Yes    Alcohol/week: 2.0 standard drinks    Types: 1 Glasses of wine, 1 Cans of beer per week    Comment: on occasion  . Drug use: No      A shared decision-making session was conducted prior to the performance of CT scan. This includes one or more decision aids, includes benefits and harms of screening, follow-up diagnostic testing, over-diagnosis, false positive rate, and total radiation exposure.   Counseling on the importance of adherence to annual lung cancer LDCT screening, impact of co-morbidities, and ability or willingness to undergo diagnosis and treatment is imperative for compliance of the program.   Counseling on the importance of continued smoking cessation for former smokers; the importance of smoking cessation for current smokers, and information about tobacco cessation interventions have been given to patient including [place] and [place] quit [place] programs.   Written order for lung cancer screening with LDCT has been given to the patient and any and all questions have been answered to the best of my abilities.    Yearly follow up will be coordinated by Burgess Estelle, Thoracic  Navigator.  Time Total: 15 minutes  Visit consisted of counseling and education dealing with complex health screening. Greater than 50%  of this time was spent counseling and coordinating care related to the above assessment and plan.  Signed by: Altha Harm, PhD, NP-C

## 2020-04-14 ENCOUNTER — Other Ambulatory Visit: Payer: Self-pay | Admitting: Nurse Practitioner

## 2020-04-17 ENCOUNTER — Encounter: Payer: Self-pay | Admitting: *Deleted

## 2020-05-15 LAB — HM DIABETES EYE EXAM

## 2020-05-18 ENCOUNTER — Encounter: Payer: Self-pay | Admitting: Nurse Practitioner

## 2020-05-18 DIAGNOSIS — J432 Centrilobular emphysema: Secondary | ICD-10-CM | POA: Insufficient documentation

## 2020-05-25 ENCOUNTER — Other Ambulatory Visit: Payer: Self-pay

## 2020-05-25 ENCOUNTER — Encounter: Payer: Self-pay | Admitting: Nurse Practitioner

## 2020-05-25 ENCOUNTER — Ambulatory Visit (INDEPENDENT_AMBULATORY_CARE_PROVIDER_SITE_OTHER): Payer: BC Managed Care – PPO | Admitting: Nurse Practitioner

## 2020-05-25 VITALS — BP 106/72 | HR 73 | Temp 98.5°F | Ht 63.0 in | Wt 226.0 lb

## 2020-05-25 DIAGNOSIS — Z1159 Encounter for screening for other viral diseases: Secondary | ICD-10-CM | POA: Diagnosis not present

## 2020-05-25 DIAGNOSIS — Z136 Encounter for screening for cardiovascular disorders: Secondary | ICD-10-CM | POA: Diagnosis not present

## 2020-05-25 DIAGNOSIS — Z1329 Encounter for screening for other suspected endocrine disorder: Secondary | ICD-10-CM | POA: Diagnosis not present

## 2020-05-25 DIAGNOSIS — Z0000 Encounter for general adult medical examination without abnormal findings: Secondary | ICD-10-CM

## 2020-05-25 LAB — BAYER DCA HB A1C WAIVED: HB A1C (BAYER DCA - WAIVED): 5.4 % (ref <7.0)

## 2020-05-25 MED ORDER — LISINOPRIL-HYDROCHLOROTHIAZIDE 20-12.5 MG PO TABS
1.0000 | ORAL_TABLET | Freq: Every day | ORAL | 4 refills | Status: DC
Start: 2020-05-25 — End: 2021-03-22

## 2020-05-25 MED ORDER — FLUOXETINE HCL 20 MG PO CAPS
ORAL_CAPSULE | ORAL | 4 refills | Status: DC
Start: 2020-05-25 — End: 2021-03-22

## 2020-05-25 MED ORDER — AMLODIPINE BESYLATE 10 MG PO TABS
ORAL_TABLET | ORAL | 4 refills | Status: DC
Start: 2020-05-25 — End: 2021-03-22

## 2020-05-25 NOTE — Patient Instructions (Signed)

## 2020-05-25 NOTE — Progress Notes (Signed)
BP 106/72   Pulse 73   Temp 98.5 F (36.9 C) (Oral)   Ht 5\' 3"  (1.6 m)   Wt 226 lb (102.5 kg)   SpO2 97%   BMI 40.03 kg/m    Subjective:    Patient ID: Lance Kim, male    DOB: Aug 14, 1971, 49 y.o.   MRN: [id number]  HPI: Lance Kim is a 49 y.o. male presenting on 05/25/2020 for comprehensive medical examination. Current medical complaints include:none  He currently lives with: daughter Interim Problems from his last visit: no  Functional Status Survey: Is the patient deaf or have difficulty hearing?: No Does the patient have difficulty seeing, even when wearing glasses/contacts?: No Does the patient have difficulty concentrating, remembering, or making decisions?: No Does the patient have difficulty walking or climbing stairs?: No Does the patient have difficulty dressing or bathing?: No Does the patient have difficulty doing errands alone such as visiting a doctor's office or shopping?: No  FALL RISK: Fall Risk  08/01/2019 07/29/2019 12/13/2018 08/30/2018 08/23/2018  Falls in the past year? 0 0 0 0 (No Data)  Comment - - - - no falls past wk  Number falls in past yr: 0 - 0 - -  Comment - - - - -  Injury with Fall? 0 - 0 - -  Follow up - - Falls evaluation completed - -    Depression Screen Depression screen [facility] 2/9 05/25/2020 02/21/2020 08/01/2019 07/29/2019 08/09/2018  Decreased Interest 0 0 0 1 0  Down, Depressed, Hopeless 1 0 0 0 0  PHQ - 2 Score 1 0 0 1 0  Altered sleeping 1 0 0 - -  Tired, decreased energy 0 0 0 - -  Change in appetite 1 0 0 - -  Feeling bad or failure about yourself  0 0 0 - -  Trouble concentrating 0 0 0 - -  Moving slowly or fidgety/restless 0 0 0 - -  Suicidal thoughts 0 0 0 - -  PHQ-9 Score 3 0 0 - -  Difficult doing work/chores Not difficult at all Not difficult at all - - -  Some recent data might be hidden    Advanced Directives <no information>  Past Medical History:  Past Medical History:  Diagnosis Date  . Anxiety    . Depression   . Diabetes mellitus without complication ([place])   . Hyperlipidemia   . Hypertension   . Hypertensive renal disease without failure   . Tobacco use disorder     Surgical History:  Past Surgical History:  Procedure Laterality Date  . COLONOSCOPY WITH PROPOFOL N/A 09/02/2019   Procedure: COLONOSCOPY WITH PROPOFOL;  Surgeon: [name], MD;  [facility] ENDOSCOPY;  Service: Gastroenterology;  Laterality: N/A;  . WISDOM TOOTH EXTRACTION      Medications:  Current Outpatient Medications on File Prior to Visit  Medication Sig  . amLODipine (NORVASC) 10 MG tablet TAKE 1 TABLET(10 MG) BY MOUTH DAILY  . Apremilast (OTEZLA) 30 MG TABS Take 1 tablet by mouth 2 (two) times daily.  [name] aspirin EC 81 MG tablet Take 81 mg by mouth daily.  [name] atorvastatin (LIPITOR) 20 MG tablet TAKE 1 TABLET(20 MG) BY MOUTH DAILY  . cetirizine (ZYRTEC) 10 MG tablet TAKE 1 TABLET(10 MG) BY MOUTH DAILY  . cyclobenzaprine (FLEXERIL) 10 MG tablet Take 1 tablet (10 mg total) by mouth 3 (three) times daily as needed for muscle spasms.  [name] FIBER ADULT GUMMIES PO Take 2 tablets by mouth  daily.   [name] FLUoxetine (PROZAC) 20 MG capsule TAKE 1 CAPSULE(20 MG) BY MOUTH DAILY  . fluticasone (FLONASE) 50 MCG/ACT nasal spray SHAKE LIQUID AND USE 2 SPRAYS IN EACH NOSTRIL TWICE DAILY  . glucose blood test strip Use as instructed  . HALOG 0.1 % CREA Apply 1 application topically in the morning and at bedtime.  [name] lisinopril-hydrochlorothiazide (ZESTORETIC) 20-12.5 MG tablet TAKE 1 TABLET BY MOUTH DAILY  . meloxicam (MOBIC) 7.5 MG tablet Take 2 tablets (15 mg total) by mouth daily. TAKE 2 TABLETS DAILY (Patient taking differently: Take 7.5 mg by mouth daily. )  . Microlet Lancets MISC USE TO TEST SUGAR TWICE DAILY  . Multiple Vitamin (MULTIVITAMIN) tablet Take 2 tablets by mouth daily.   . ondansetron (ZOFRAN-ODT) 4 MG disintegrating tablet DISSOLVE 1 TABLET ON THE TONGUE EVERY 8 HOURS AS NEEDED FOR NAUSEA  . Triamcinolone  Acetonide (TRIAMCINOLONE 0.1 % CREAM : EUCERIN) CREA Apply 1 application topically daily. Please supply one jar  . UNABLE TO FIND Lancets (Contour Choice)   No current facility-administered medications on file prior to visit.    Allergies:  Allergies  Allergen Reactions  . Mevacor [Lovastatin] Other (See Comments)    CRAMPS    Social History:  Social History   Socioeconomic History  . Marital status: Married    Spouse name: Not on file  . Number of children: Not on file  . Years of education: Not on file  . Highest education level: Not on file  Occupational History  . Not on file  Tobacco Use  . Smoking status: Current Every Day Smoker    Packs/day: 0.75    Years: 32.00    Pack years: 24.00    Types: Cigarettes  . Smokeless tobacco: Never Used  . Tobacco comment: .5ppd now  Vaping Use  . Vaping Use: Never used  Substance and Sexual Activity  . Alcohol use: Yes    Alcohol/week: 2.0 standard drinks    Types: 1 Glasses of wine, 1 Cans of beer per week    Comment: on occasion  . Drug use: No  . Sexual activity: Yes    Partners: Female    Comment: satidfied with sexual function  Other Topics Concern  . Not on file  Social History Narrative  . Not on file   Social Determinants of Health   Financial Resource Strain:   . Difficulty of Paying Living Expenses: Not on file  Food Insecurity:   . Worried About Charity fundraiser in the Last Year: Not on file  . Ran Out of Food in the Last Year: Not on file  Transportation Needs:   . Lack of Transportation (Medical): Not on file  . Lack of Transportation (Non-Medical): Not on file  Physical Activity:   . Days of Exercise per Week: Not on file  . Minutes of Exercise per Session: Not on file  Stress:   . Feeling of Stress : Not on file  Social Connections:   . Frequency of Communication with Friends and Family: Not on file  . Frequency of Social Gatherings with Friends and Family: Not on file  . Attends Religious  Services: Not on file  . Active Member of Clubs or Organizations: Not on file  . Attends Archivist Meetings: Not on file  . Marital Status: Not on file  Intimate Partner Violence:   . Fear of Current or Ex-Partner: Not on file  . Emotionally Abused: Not on file  . Physically Abused:  Not on file  . Sexually Abused: Not on file   Social History   Tobacco Use  Smoking Status Current Every Day Smoker  . Packs/day: 0.75  . Years: 32.00  . Pack years: 24.00  . Types: Cigarettes  Smokeless Tobacco Never Used  Tobacco Comment   .5ppd now   Social History   Substance and Sexual Activity  Alcohol Use Yes  . Alcohol/week: 2.0 standard drinks  . Types: 1 Glasses of wine, 1 Cans of beer per week   Comment: on occasion    Family History:  Family History  Problem Relation Age of Onset  . Diabetes Mother   . Emphysema Father   . Cancer Son   . Cancer Maternal Grandmother   . Prostate cancer Neg Hx   . Bladder Cancer Neg Hx   . Kidney cancer Neg Hx     Past medical history, surgical history, medications, allergies, family history and social history reviewed with patient today and changes made to appropriate areas of the chart.   Review of Systems - negative All other ROS negative except what is listed above and in the HPI.      Objective:    BP 106/72   Pulse 73   Temp 98.5 F (36.9 C) (Oral)   Ht 5\' 3"  (1.6 m)   Wt 226 lb (102.5 kg)   SpO2 97%   BMI 40.03 kg/m   Wt Readings from Last 3 Encounters:  05/25/20 226 lb (102.5 kg)  04/13/20 (!) 215 lb (97.5 kg)  02/21/20 212 lb 9.6 oz (96.4 kg)    Physical Exam Vitals and nursing note reviewed.  Constitutional:      General: He is awake. He is not in acute distress.    Appearance: He is well-developed and well-groomed. He is morbidly obese. He is not ill-appearing.  HENT:     Head: Normocephalic and atraumatic.     Right Ear: Hearing, tympanic membrane, ear canal and external ear normal. No drainage.      Left Ear: Hearing, tympanic membrane, ear canal and external ear normal. No drainage.     Nose: Nose normal.     Mouth/Throat:     Pharynx: Uvula midline.  Eyes:     General: Lids are normal.        Right eye: No discharge.        Left eye: No discharge.     Extraocular Movements: Extraocular movements intact.     Conjunctiva/sclera: Conjunctivae normal.     Pupils: Pupils are equal, round, and reactive to light.     Visual Fields: Right eye visual fields normal and left eye visual fields normal.  Neck:     Thyroid: No thyromegaly.     Vascular: No carotid bruit or JVD.     Trachea: Trachea normal.  Cardiovascular:     Rate and Rhythm: Normal rate and regular rhythm.     Heart sounds: Normal heart sounds, S1 normal and S2 normal. No murmur heard.  No gallop.   Pulmonary:     Effort: Pulmonary effort is normal. No accessory muscle usage or respiratory distress.     Breath sounds: Normal breath sounds.  Abdominal:     General: Bowel sounds are normal.     Palpations: Abdomen is soft. There is no hepatomegaly or splenomegaly.     Tenderness: There is no abdominal tenderness.  Musculoskeletal:        General: Normal range of motion.     Cervical back:  Normal range of motion and neck supple.     Right lower leg: No edema.     Left lower leg: No edema.  Lymphadenopathy:     Head:     Right side of head: No submental, submandibular, tonsillar, preauricular or posterior auricular adenopathy.     Left side of head: No submental, submandibular, tonsillar, preauricular or posterior auricular adenopathy.     Cervical: No cervical adenopathy.  Skin:    General: Skin is warm and dry.     Capillary Refill: Capillary refill takes less than 2 seconds.     Findings: No rash.  Neurological:     Mental Status: He is alert and oriented to person, place, and time.     Cranial Nerves: Cranial nerves are intact.     Gait: Gait is intact.     Deep Tendon Reflexes: Reflexes are normal and  symmetric.     Reflex Scores:      Brachioradialis reflexes are 2+ on the right side and 2+ on the left side.      Patellar reflexes are 2+ on the right side and 2+ on the left side. Psychiatric:        Attention and Perception: Attention normal.        Mood and Affect: Mood normal.        Speech: Speech normal.        Behavior: Behavior normal. Behavior is cooperative.        Thought Content: Thought content normal.        Cognition and Memory: Cognition normal.        Judgment: Judgment normal.      Results for orders placed or performed in visit on 02/21/20  Bayer DCA Hb A1c Waived  Result Value Ref Range   HB A1C (BAYER DCA - WAIVED) 5.3 <7.0 %  Microalbumin, Urine Waived  Result Value Ref Range   Microalb, Ur Waived 10 0 - 19 mg/L   Creatinine, Urine Waived 100 10 - 300 mg/dL   Microalb/Creat Ratio <30 <30 mg/g  Basic metabolic panel  Result Value Ref Range   Glucose 73 65 - 99 mg/dL   BUN 14 6 - 24 mg/dL   Creatinine, Ser 1.01 0.76 - 1.27 mg/dL   GFR calc non Af Amer 86 >59 mL/min/1.73   GFR calc Af Amer 100 >59 mL/min/1.73   BUN/Creatinine Ratio 14 9 - 20   Sodium 140 134 - 144 mmol/L   Potassium 4.3 3.5 - 5.2 mmol/L   Chloride 104 96 - 106 mmol/L   CO2 22 20 - 29 mmol/L   Calcium 9.2 8.7 - 10.2 mg/dL  Lipid Panel w/o Chol/HDL Ratio  Result Value Ref Range   Cholesterol, Total 112 100 - 199 mg/dL   Triglycerides 55 0 - 149 mg/dL   HDL 43 >39 mg/dL   VLDL Cholesterol Cal 13 5 - 40 mg/dL   LDL Chol Calc (NIH) 56 0 - 99 mg/dL      Assessment & Plan:   Problem List Items Addressed This Visit    None    Visit Diagnoses    Routine general medical examination at a health care facility    -  Primary   Annual labs today to include CBC, CMP, PSA, LIPID, A1C   Relevant Orders   Bayer DCA Hb A1c Waived   CBC with Differential/Platelet   Comprehensive metabolic panel   Lipid Panel w/o Chol/HDL Ratio   PSA   Thyroid disorder screen  TSH on labs today    Relevant Orders   TSH   Need for hepatitis C screening test       Hep C screening on labs today   Relevant Orders   Hepatitis C antibody     Discussed aspirin prophylaxis for myocardial infarction prevention and decision was made to continue ASA  LABORATORY TESTING:  Health maintenance labs ordered today as discussed above.   The natural history of prostate cancer and ongoing controversy regarding screening and potential treatment outcomes of prostate cancer has been discussed with the patient. The meaning of a false positive PSA and a false negative PSA has been discussed. He indicates understanding of the limitations of this screening test and wishes  to proceed with screening PSA testing.   IMMUNIZATIONS:   - Tdap: Tetanus vaccination status reviewed: last tetanus booster within 10 years. - Influenza: Refused - Pneumovax: Up to date - Prevnar: Not applicable - Zostavax vaccine: Refused  SCREENING: - Colonoscopy: Up to date  Discussed with patient purpose of the colonoscopy is to detect colon cancer at curable precancerous or early stages   - AAA Screening: Not applicable  -Hearing Test: Done elsewhere  -Spirometry: next visit  PATIENT COUNSELING:    Sexuality: Discussed sexually transmitted diseases, partner selection, use of condoms, avoidance of unintended pregnancy  and contraceptive alternatives.   Advised to avoid cigarette smoking.  I discussed with the patient that most people either abstain from alcohol or drink within safe limits (<=14/week and <=4 drinks/occasion for males, <=7/weeks and <= 3 drinks/occasion for females) and that the risk for alcohol disorders and other health effects rises proportionally with the number of drinks per week and how often a drinker exceeds daily limits.  Discussed cessation/primary prevention of drug use and availability of treatment for abuse.   Diet: Encouraged to adjust caloric intake to maintain  or achieve ideal body weight,  to reduce intake of dietary saturated fat and total fat, to limit sodium intake by avoiding high sodium foods and not adding table salt, and to maintain adequate dietary potassium and calcium preferably from fresh fruits, vegetables, and low-fat dairy products.    stressed the importance of regular exercise  Injury prevention: Discussed safety belts, safety helmets, smoke detector, smoking near bedding or upholstery.   Dental health: Discussed importance of regular tooth brushing, flossing, and dental visits.   Follow up plan: NEXT PREVENTATIVE PHYSICAL DUE IN 1 YEAR. Return in about 6 months (around 11/22/2020) for T2DM, HTN/HLD, EMPHYSEMA -- spirometry.

## 2020-05-26 LAB — COMPREHENSIVE METABOLIC PANEL
ALT: 35 IU/L (ref 0–44)
AST: 27 IU/L (ref 0–40)
Albumin/Globulin Ratio: 1.3 (ref 1.2–2.2)
Albumin: 4.1 g/dL (ref 4.0–5.0)
Alkaline Phosphatase: 81 IU/L (ref 48–121)
BUN/Creatinine Ratio: 13 (ref 9–20)
BUN: 14 mg/dL (ref 6–24)
Bilirubin Total: 0.2 mg/dL (ref 0.0–1.2)
CO2: 23 mmol/L (ref 20–29)
Calcium: 9.5 mg/dL (ref 8.7–10.2)
Chloride: 100 mmol/L (ref 96–106)
Creatinine, Ser: 1.1 mg/dL (ref 0.76–1.27)
GFR calc Af Amer: 90 mL/min/{1.73_m2} (ref >59)
GFR calc non Af Amer: 78 mL/min/{1.73_m2} (ref >59)
Globulin, Total: 3.1 g/dL (ref 1.5–4.5)
Glucose: 89 mg/dL (ref 65–99)
Potassium: 4.7 mmol/L (ref 3.5–5.2)
Sodium: 137 mmol/L (ref 134–144)
Total Protein: 7.2 g/dL (ref 6.0–8.5)

## 2020-05-26 LAB — LIPID PANEL W/O CHOL/HDL RATIO
Cholesterol, Total: 133 mg/dL (ref 100–199)
HDL: 46 mg/dL (ref >39)
LDL Chol Calc (NIH): 69 mg/dL (ref 0–99)
Triglycerides: 93 mg/dL (ref 0–149)
VLDL Cholesterol Cal: 18 mg/dL (ref 5–40)

## 2020-05-26 LAB — CBC WITH DIFFERENTIAL/PLATELET
Basophils Absolute: 0.1 10*3/uL (ref 0.0–0.2)
Basos: 1 %
EOS (ABSOLUTE): 0.2 10*3/uL (ref 0.0–0.4)
Eos: 3 %
Hematocrit: 42.7 % (ref 37.5–51.0)
Hemoglobin: 14.8 g/dL (ref 13.0–17.7)
Immature Grans (Abs): 0 10*3/uL (ref 0.0–0.1)
Immature Granulocytes: 0 %
Lymphocytes Absolute: 2.8 10*3/uL (ref 0.7–3.1)
Lymphs: 38 %
MCH: 29.7 pg (ref 26.6–33.0)
MCHC: 34.7 g/dL (ref 31.5–35.7)
MCV: 86 fL (ref 79–97)
Monocytes Absolute: 0.8 10*3/uL (ref 0.1–0.9)
Monocytes: 11 %
Neutrophils Absolute: 3.6 10*3/uL (ref 1.4–7.0)
Neutrophils: 47 %
Platelets: 230 10*3/uL (ref 150–450)
RBC: 4.99 x10E6/uL (ref 4.14–5.80)
RDW: 13.5 % (ref 11.6–15.4)
WBC: 7.5 10*3/uL (ref 3.4–10.8)

## 2020-05-26 LAB — HEPATITIS C ANTIBODY: Hep C Virus Ab: <0.1 s/co ratio (ref 0.0–0.9)

## 2020-05-26 LAB — TSH: TSH: 0.775 u[IU]/mL (ref 0.450–4.500)

## 2020-05-26 LAB — PSA: Prostate Specific Ag, Serum: 0.4 ng/mL (ref 0.0–4.0)

## 2020-05-27 NOTE — Progress Notes (Signed)
Contacted via [place] afternoon [place], your labs have returned and overall they look fantastic.  No changes needed.  Have a great day!!  Thank you for inspiring me. Keep being awesome!!  Thank you for allowing me to participate in your care. Kindest regards, Alani Sabbagh

## 2020-06-10 ENCOUNTER — Other Ambulatory Visit: Payer: Self-pay | Admitting: Nurse Practitioner

## 2020-06-20 ENCOUNTER — Other Ambulatory Visit: Payer: Self-pay | Admitting: Family Medicine

## 2020-06-20 NOTE — Telephone Encounter (Signed)
Requested Prescriptions  Pending Prescriptions Disp Refills  . [place] [Pharmacy Med Name: MICROLET COLORED LANCETS 100] 100 each     Sig: USE TO TEST SUGAR TWICE DAILY     Endocrinology: Diabetes - Testing Supplies Passed - 06/20/2020  3:38 AM      Passed - Valid encounter within last 12 months    Recent Outpatient Visits          3 weeks ago Routine general medical examination at a health care facility   [facility], [place] T, NP   4 months ago Type 2 diabetes mellitus with obesity ([place])   [place], [place]a T, NP   6 months ago Plantar fasciitis   [place], Jolene T, NP   10 months ago Type 2 diabetes mellitus without complication, without long-term current use of insulin ([place])   [place], [place] T, NP   11 months ago Upper respiratory tract infection, unspecified type   [employer], [place], DO      Future Appointments            In 5 months Cann[place], Barbaraann Faster, NP [employer]

## 2020-07-02 ENCOUNTER — Other Ambulatory Visit: Payer: Self-pay | Admitting: Nurse Practitioner

## 2020-09-04 ENCOUNTER — Other Ambulatory Visit: Payer: Self-pay | Admitting: Nurse Practitioner

## 2020-10-09 ENCOUNTER — Other Ambulatory Visit: Payer: Self-pay | Admitting: Nurse Practitioner

## 2020-11-23 ENCOUNTER — Other Ambulatory Visit: Payer: Self-pay

## 2020-11-23 ENCOUNTER — Encounter: Payer: Self-pay | Admitting: Nurse Practitioner

## 2020-11-23 ENCOUNTER — Ambulatory Visit: Payer: BC Managed Care – PPO | Admitting: Nurse Practitioner

## 2020-11-23 VITALS — BP 118/79 | HR 79 | Temp 98.0°F | Wt 246.2 lb

## 2020-11-23 DIAGNOSIS — E669 Obesity, unspecified: Secondary | ICD-10-CM

## 2020-11-23 DIAGNOSIS — J432 Centrilobular emphysema: Secondary | ICD-10-CM | POA: Diagnosis not present

## 2020-11-23 DIAGNOSIS — I152 Hypertension secondary to endocrine disorders: Secondary | ICD-10-CM

## 2020-11-23 DIAGNOSIS — F17210 Nicotine dependence, cigarettes, uncomplicated: Secondary | ICD-10-CM

## 2020-11-23 DIAGNOSIS — F325 Major depressive disorder, single episode, in full remission: Secondary | ICD-10-CM | POA: Diagnosis not present

## 2020-11-23 DIAGNOSIS — E1169 Type 2 diabetes mellitus with other specified complication: Secondary | ICD-10-CM

## 2020-11-23 DIAGNOSIS — E1159 Type 2 diabetes mellitus with other circulatory complications: Secondary | ICD-10-CM | POA: Diagnosis not present

## 2020-11-23 DIAGNOSIS — E785 Hyperlipidemia, unspecified: Secondary | ICD-10-CM | POA: Diagnosis not present

## 2020-11-23 NOTE — Assessment & Plan Note (Signed)
Chronic, stable with A1C today 6.4% remaining off Metformin and maintaining diet + weight & urine ALB 10.  Praised him for maintaining weight loss.  Will continue off medication at this time and continue diet focus.  Return in 6 months for annual physical.

## 2020-11-23 NOTE — Assessment & Plan Note (Signed)
I have recommended complete cessation of tobacco use. I have discussed various options available for assistance with tobacco cessation including over the counter methods (Nicotine gum, patch and lozenges). We also discussed prescription options (Chantix, Nicotine Inhaler / Nasal Spray). The patient is not interested in pursuing any prescription tobacco cessation options at this time.  

## 2020-11-23 NOTE — Progress Notes (Signed)
BP 118/79   Pulse 79   Temp 98 F (36.7 C) (Oral)   Wt 246 lb 3.2 oz (111.7 kg)   SpO2 98%   BMI 43.61 kg/m    Subjective:    Patient ID: Lance Kim, male    DOB: 21-May-1971, 50 y.o.   MRN: [id number]  HPI: Lance Kim is a 50 y.o. male  Chief Complaint  Patient presents with  . Diabetes  . Hyperlipidemia  . Hypertension  . Emphysema  . Follow-up    Patient states he has a few questions regarding today's visit. Patient would like to discuss medication refills.   DIABETES Last visit A1C 5.4% and continues off medication at this time. Had significant weight loss in past and is attempting to maintain, has had some gain. Hypoglycemic episodes:no Polydipsia/polyuria: no Visual disturbance: no Chest pain: no Paresthesias: no Glucose Monitoring: no  Accucheck frequency: not checking  Fasting glucose:   Post prandial:  Evening:   Before meals: Taking Insulin?: no  Long acting insulin:  Short acting insulin: Blood Pressure Monitoring: not checking Retinal Examination: Up To Date Foot Exam: Up to Date Pneumovax: Up to Date Influenza: Not Up to Date Aspirin: yes   HYPERTENSION / HYPERLIPIDEMIA Continues on Lisinopril-HCTZ, Amlodipine, and Atorvastatin, ASA.   Satisfied with current treatment? yes Duration of hypertension: chronic BP monitoring frequency: not checking BP range:  BP medication side effects: no Duration of hyperlipidemia: chronic Cholesterol medication side effects: no Cholesterol supplements: none Medication compliance: good compliance Aspirin: yes Recent stressors: no Recurrent headaches: no Visual changes: no Palpitations: no Dyspnea: no Chest pain: no Lower extremity edema: no Dizzy/lightheaded: no   COPD Has annual lung CT screening with last on 04/13/20 noting emphysema and bilateral gynecomastia.  Continues to smoke daily, a little bit more recently due to loss of mother. COPD status: stable Satisfied with current  treatment?: yes Oxygen use: no Dyspnea frequency: none Cough frequency: none Rescue inhaler frequency:  none Limitation of activity: no Productive cough: none Last Spirometry: today Pneumovax: Up to Date Influenza: refused   DEPRESSION Continues on Prozac 20 MG daily. Mood status: stable Satisfied with current treatment?: yes Symptom severity: mild  Duration of current treatment : chronic Side effects: no Medication compliance: good compliance Psychotherapy/counseling: none Depressed mood: no Anxious mood: no Anhedonia: no Significant weight loss or gain: no Insomnia: none Fatigue: no Feelings of worthlessness or guilt: no Impaired concentration/indecisiveness: no Suicidal ideations: no Hopelessness: no Crying spells: with loss of mother Depression screen [facility] 2/9 11/23/2020 05/25/2020 02/21/2020 08/01/2019 07/29/2019  Decreased Interest 0 0 0 0 1  Down, Depressed, Hopeless 1 1 0 0 0  PHQ - 2 Score 1 1 0 0 1  Altered sleeping 1 1 0 0 -  Tired, decreased energy 1 0 0 0 -  Change in appetite 0 1 0 0 -  Feeling bad or failure about yourself  0 0 0 0 -  Trouble concentrating 0 0 0 0 -  Moving slowly or fidgety/restless 0 0 0 0 -  Suicidal thoughts 0 0 0 0 -  PHQ-9 Score 3 3 0 0 -  Difficult doing work/chores Not difficult at all Not difficult at all Not difficult at all - -  Some recent data might be hidden    Relevant past medical, surgical, family and social history reviewed and updated as indicated. Interim medical history since our last visit reviewed. Allergies and medications reviewed and updated.  Review of Systems  Constitutional: Negative  for activity change, diaphoresis, fatigue and fever.  Respiratory: Negative for cough, chest tightness, shortness of breath and wheezing.   Cardiovascular: Negative for chest pain, palpitations and leg swelling.  Gastrointestinal: Negative.   Endocrine: Negative.   Neurological: Negative.   Psychiatric/Behavioral: Negative.     Per HPI unless specifically indicated above    Objective:    BP 118/79   Pulse 79   Temp 98 F (36.7 C) (Oral)   Wt 246 lb 3.2 oz (111.7 kg)   SpO2 98%   BMI 43.61 kg/m   Wt Readings from Last 3 Encounters:  11/23/20 246 lb 3.2 oz (111.7 kg)  05/25/20 226 lb (102.5 kg)  04/13/20 (!) 215 lb (97.5 kg)    Physical Exam Vitals and nursing note reviewed.  Constitutional:      General: He is awake. He is not in acute distress.    Appearance: He is well-developed. He is not ill-appearing.  HENT:     Head: Normocephalic.     Right Ear: Hearing normal. No drainage.     Left Ear: Hearing normal. No drainage.     Mouth/Throat:     Pharynx: Uvula midline.  Eyes:     General: Lids are normal.        Right eye: No discharge.        Left eye: No discharge.     Conjunctiva/sclera: Conjunctivae normal.     Pupils: Pupils are equal, round, and reactive to light.  Neck:     Vascular: No carotid bruit.  Cardiovascular:     Rate and Rhythm: Normal rate and regular rhythm.     Heart sounds: Normal heart sounds, S1 normal and S2 normal. No murmur heard. No gallop.      Comments: No edema to legs. Pulmonary:     Effort: Pulmonary effort is normal. No accessory muscle usage or respiratory distress.     Breath sounds: Normal breath sounds.  Abdominal:     General: Bowel sounds are normal.     Palpations: Abdomen is soft.  Musculoskeletal:        General: Normal range of motion.     Cervical back: Normal range of motion and neck supple.     Right lower leg: No edema.     Left lower leg: No edema.  Skin:    General: Skin is warm and dry.     Capillary Refill: Capillary refill takes less than 2 seconds.     Findings: No rash.  Neurological:     Mental Status: He is alert and oriented to person, place, and time.     Deep Tendon Reflexes: Reflexes are normal and symmetric.  Psychiatric:        Attention and Perception: Attention normal.        Mood and Affect: Mood normal.         Speech: Speech normal.        Behavior: Behavior normal. Behavior is cooperative.        Thought Content: Thought content normal.    Results for orders placed or performed in visit on 05/30/20  HM DIABETES EYE EXAM  Result Value Ref Range   HM Diabetic Eye Exam No Retinopathy No Retinopathy      Assessment & Plan:   Problem List Items Addressed This Visit      Cardiovascular and Mediastinum   Hypertension associated with type 2 diabetes mellitus ([place])    Chronic, stable with A1C today 6.4% & urine ALB 10 remaining  off Metformin and maintaining diet + weight and BP remains under goal.  Praised him for ongoing maintenance of weight.  Will continue current BP medications and adjust as needed, Lisinopril offering kidney protection.  CMP today.  Return in 6 months.      Relevant Orders   Bayer DCA Hb A1c Waived   Microalbumin, Urine Waived   Comprehensive metabolic panel     Respiratory   Centrilobular emphysema (HCC)    Noted on CT imaging.  FEV1 99% and FEV1/FVC 95% today.  No current inhalers.  Recommend complete cessation of smoking and initiate inhalers as needed.      Relevant Orders   Spirometry with graph (Completed)     Endocrine   Hyperlipidemia associated with type 2 diabetes mellitus (HCC)    Chronic, ongoing.  Continue current medication regimen and adjust as needed.  Lipid panel today.       Relevant Orders   Bayer DCA Hb A1c Waived   Lipid Panel w/o Chol/HDL Ratio   Type 2 diabetes mellitus with obesity (HCC) - Primary    Chronic, stable with A1C today 6.4% remaining off Metformin and maintaining diet + weight & urine ALB 10.  Praised him for maintaining weight loss.  Will continue off medication at this time and continue diet focus.  Return in 6 months for annual physical.      Relevant Orders   Bayer DCA Hb A1c Waived     Other   Depression    Chronic, stable.  Denies SI/HI.  Continue current medication regimen and adjust as needed.  Recent loss of his  mother.  Return in 6 months.      Cigarette nicotine dependence without complication    I have recommended complete cessation of tobacco use. I have discussed various options available for assistance with tobacco cessation including over the counter methods (Nicotine gum, patch and lozenges). We also discussed prescription options (Chantix, Nicotine Inhaler / Nasal Spray). The patient is not interested in pursuing any prescription tobacco cessation options at this time.           Follow up plan: Return in about 6 months (around 05/26/2021) for Annual physical.

## 2020-11-23 NOTE — Assessment & Plan Note (Signed)
Chronic, ongoing.  Continue current medication regimen and adjust as needed. Lipid panel today. 

## 2020-11-23 NOTE — Patient Instructions (Signed)
Diabetes Mellitus and Nutrition, Adult When you have diabetes, or diabetes mellitus, it is very important to have healthy eating habits because your blood sugar (glucose) levels are greatly affected by what you eat and drink. Eating healthy foods in the right amounts, at about the same times every day, can help you:  Control your blood glucose.  Lower your risk of heart disease.  Improve your blood pressure.  Reach or maintain a healthy weight. What can affect my meal plan? Every person with diabetes is different, and each person has different needs for a meal plan. Your health care provider may recommend that you work with a dietitian to make a meal plan that is best for you. Your meal plan may vary depending on factors such as:  The calories you need.  The medicines you take.  Your weight.  Your blood glucose, blood pressure, and cholesterol levels.  Your activity level.  Other health conditions you have, such as heart or kidney disease. How do carbohydrates affect me? Carbohydrates, also called carbs, affect your blood glucose level more than any other type of food. Eating carbs naturally raises the amount of glucose in your blood. Carb counting is a method for keeping track of how many carbs you eat. Counting carbs is important to keep your blood glucose at a healthy level, especially if you use insulin or take certain oral diabetes medicines. It is important to know how many carbs you can safely have in each meal. This is different for every person. Your dietitian can help you calculate how many carbs you should have at each meal and for each snack. How does alcohol affect me? Alcohol can cause a sudden decrease in blood glucose (hypoglycemia), especially if you use insulin or take certain oral diabetes medicines. Hypoglycemia can be a life-threatening condition. Symptoms of hypoglycemia, such as sleepiness, dizziness, and confusion, are similar to symptoms of having too much  alcohol.  Do not drink alcohol if: ? Your health care provider tells you not to drink. ? You are pregnant, may be pregnant, or are planning to become pregnant.  If you drink alcohol: ? Do not drink on an empty stomach. ? Limit how much you use to:  0-1 drink a day for women.  0-2 drinks a day for men. ? Be aware of how much alcohol is in your drink. In the [place] one drink equals one 12 oz bottle of beer (355 mL), one 5 oz glass of wine (148 mL), or one 1 oz glass of hard liquor (44 mL). ? Keep yourself hydrated with water, diet soda, or unsweetened iced tea.  Keep in mind that regular soda, juice, and other mixers may contain a lot of sugar and must be counted as carbs. What are tips for following this plan? Reading food labels  Start by checking the serving size on the "Nutrition Facts" label of packaged foods and drinks. The amount of calories, carbs, fats, and other nutrients listed on the label is based on one serving of the item. Many items contain more than one serving per package.  Check the total grams (g) of carbs in one serving. You can calculate the number of servings of carbs in one serving by dividing the total carbs by 15. For example, if a food has 30 g of total carbs per serving, it would be equal to 2 servings of carbs.  Check the number of grams (g) of saturated fats and trans fats in one serving. Choose foods that have   a low amount or none of these fats.  Check the number of milligrams (mg) of salt (sodium) in one serving. Most people should limit total sodium intake to less than 2,300 mg per day.  Always check the nutrition information of foods labeled as "low-fat" or "nonfat." These foods may be higher in added sugar or refined carbs and should be avoided.  Talk to your dietitian to identify your daily goals for nutrients listed on the label. Shopping  Avoid buying canned, pre-made, or processed foods. These foods tend to be high in fat, sodium, and added  sugar.  Shop around the outside edge of the grocery store. This is where you will most often find fresh fruits and vegetables, bulk grains, fresh meats, and fresh dairy. Cooking  Use low-heat cooking methods, such as baking, instead of high-heat cooking methods like deep frying.  Cook using healthy oils, such as olive, canola, or sunflower oil.  Avoid cooking with butter, cream, or high-fat meats. Meal planning  Eat meals and snacks regularly, preferably at the same times every day. Avoid going long periods of time without eating.  Eat foods that are high in fiber, such as fresh fruits, vegetables, beans, and whole grains. Talk with your dietitian about how many servings of carbs you can eat at each meal.  Eat 4-6 oz (112-168 g) of lean protein each day, such as lean meat, chicken, fish, eggs, or tofu. One ounce (oz) of lean protein is equal to: ? 1 oz (28 g) of meat, chicken, or fish. ? 1 egg. ?  cup (62 g) of tofu.  Eat some foods each day that contain healthy fats, such as avocado, nuts, seeds, and fish.   What foods should I eat? Fruits Berries. Apples. Oranges. Peaches. Apricots. Plums. Grapes. Mango. Papaya. Pomegranate. Kiwi. Cherries. Vegetables Lettuce. Spinach. Leafy greens, including kale, chard, collard greens, and mustard greens. Beets. Cauliflower. Cabbage. Broccoli. Carrots. Green beans. Tomatoes. Peppers. Onions. Cucumbers. Brussels sprouts. Grains Whole grains, such as whole-wheat or whole-grain bread, crackers, tortillas, cereal, and pasta. Unsweetened oatmeal. Quinoa. Brown or wild rice. Meats and other proteins Seafood. Poultry without skin. Lean cuts of poultry and beef. Tofu. Nuts. Seeds. Dairy Low-fat or fat-free dairy products such as milk, yogurt, and cheese. The items listed above may not be a complete list of foods and beverages you can eat. Contact a dietitian for more information. What foods should I avoid? Fruits Fruits canned with  syrup. Vegetables Canned vegetables. Frozen vegetables with butter or cream sauce. Grains Refined white flour and flour products such as bread, pasta, snack foods, and cereals. Avoid all processed foods. Meats and other proteins Fatty cuts of meat. Poultry with skin. Breaded or fried meats. Processed meat. Avoid saturated fats. Dairy Full-fat yogurt, cheese, or milk. Beverages Sweetened drinks, such as soda or iced tea. The items listed above may not be a complete list of foods and beverages you should avoid. Contact a dietitian for more information. Questions to ask a health care provider  Do I need to meet with a diabetes educator?  Do I need to meet with a dietitian?  What number can I call if I have questions?  When are the best times to check my blood glucose? Where to find more information:  American Diabetes Association: diabetes.org  Academy of Nutrition and Dietetics: www.eatright.org  National Institute of Diabetes and Digestive and Kidney Diseases: www.niddk.nih.gov  Association of Diabetes Care and Education Specialists: www.diabeteseducator.org Summary  It is important to have healthy eating   habits because your blood sugar (glucose) levels are greatly affected by what you eat and drink.  A healthy meal plan will help you control your blood glucose and maintain a healthy lifestyle.  Your health care provider may recommend that you work with a dietitian to make a meal plan that is best for you.  Keep in mind that carbohydrates (carbs) and alcohol have immediate effects on your blood glucose levels. It is important to count carbs and to use alcohol carefully. This information is not intended to replace advice given to you by your health care provider. Make sure you discuss any questions you have with your health care provider. Document Revised: 08/09/2019 Document Reviewed: 08/09/2019 Elsevier Patient Education  2021 Elsevier Inc.  

## 2020-11-23 NOTE — Assessment & Plan Note (Signed)
Noted on CT imaging.  FEV1 99% and FEV1/FVC 95% today.  No current inhalers.  Recommend complete cessation of smoking and initiate inhalers as needed.

## 2020-11-23 NOTE — Assessment & Plan Note (Signed)
Chronic, stable with A1C today 6.4% & urine ALB 10 remaining off Metformin and maintaining diet + weight and BP remains under goal.  Praised him for ongoing maintenance of weight.  Will continue current BP medications and adjust as needed, Lisinopril offering kidney protection.  CMP today.  Return in 6 months.

## 2020-11-23 NOTE — Assessment & Plan Note (Signed)
Chronic, stable.  Denies SI/HI.  Continue current medication regimen and adjust as needed.  Recent loss of his mother.  Return in 6 months.

## 2020-11-24 LAB — LIPID PANEL W/O CHOL/HDL RATIO
Cholesterol, Total: 132 mg/dL (ref 100–199)
HDL: 38 mg/dL — ABNORMAL LOW (ref >39)
LDL Chol Calc (NIH): 75 mg/dL (ref 0–99)
Triglycerides: 102 mg/dL (ref 0–149)
VLDL Cholesterol Cal: 19 mg/dL (ref 5–40)

## 2020-11-24 LAB — BAYER DCA HB A1C WAIVED: HB A1C (BAYER DCA - WAIVED): 6.4 % (ref <7.0)

## 2020-11-24 LAB — COMPREHENSIVE METABOLIC PANEL
ALT: 22 IU/L (ref 0–44)
AST: 17 IU/L (ref 0–40)
Albumin/Globulin Ratio: 1.2 (ref 1.2–2.2)
Albumin: 4.2 g/dL (ref 3.8–4.9)
Alkaline Phosphatase: 88 IU/L (ref 44–121)
BUN/Creatinine Ratio: 9 (ref 9–20)
BUN: 10 mg/dL (ref 6–24)
Bilirubin Total: 0.2 mg/dL (ref 0.0–1.2)
CO2: 22 mmol/L (ref 20–29)
Calcium: 9.1 mg/dL (ref 8.7–10.2)
Chloride: 102 mmol/L (ref 96–106)
Creatinine, Ser: 1.12 mg/dL (ref 0.76–1.27)
Globulin, Total: 3.6 g/dL (ref 1.5–4.5)
Glucose: 107 mg/dL — ABNORMAL HIGH (ref 65–99)
Potassium: 4.3 mmol/L (ref 3.5–5.2)
Sodium: 137 mmol/L (ref 134–144)
Total Protein: 7.8 g/dL (ref 6.0–8.5)
eGFR: 80 mL/min/{1.73_m2} (ref >59)

## 2020-11-24 LAB — MICROALBUMIN, URINE WAIVED
Creatinine, Urine Waived: 100 mg/dL (ref 10–300)
Microalb, Ur Waived: 10 mg/L (ref 0–19)
Microalb/Creat Ratio: <30 mg/g (ref <30)

## 2020-11-24 NOTE — Progress Notes (Signed)
Contacted via MyChart   Good morning Lance Kim, overall your labs continue to be stable, including cholesterol levels.  Continue all current medications.  You truly are doing fantastic!!  I am very proud of your success and thank you for inspiring me.  Also thank your daughter for being such an incredible [employer] and support -- she is wonderful. Keep being amazing!!  Thank you for allowing me to participate in your care. Kindest regards, Riannon Mukherjee

## 2020-12-19 ENCOUNTER — Other Ambulatory Visit: Payer: Self-pay | Admitting: Nurse Practitioner

## 2020-12-20 NOTE — Telephone Encounter (Signed)
Pt last seen 11/23/20 scheduled for cpe September

## 2021-03-01 ENCOUNTER — Other Ambulatory Visit: Payer: Self-pay | Admitting: Nurse Practitioner

## 2021-03-22 ENCOUNTER — Encounter: Payer: Self-pay | Admitting: Nurse Practitioner

## 2021-03-22 ENCOUNTER — Other Ambulatory Visit: Payer: Self-pay

## 2021-03-22 ENCOUNTER — Ambulatory Visit: Payer: BC Managed Care – PPO | Admitting: Nurse Practitioner

## 2021-03-22 DIAGNOSIS — G8929 Other chronic pain: Secondary | ICD-10-CM | POA: Insufficient documentation

## 2021-03-22 DIAGNOSIS — M25512 Pain in left shoulder: Secondary | ICD-10-CM | POA: Insufficient documentation

## 2021-03-22 MED ORDER — MELOXICAM 7.5 MG PO TABS: 15.0000 mg | ORAL_TABLET | Freq: Every day | ORAL | 2 refills | Status: DC

## 2021-03-22 MED ORDER — FLUTICASONE PROPIONATE 50 MCG/ACT NA SUSP: NASAL | 4 refills | Status: DC

## 2021-03-22 MED ORDER — ONDANSETRON 4 MG PO TBDP: ORAL_TABLET | ORAL | 2 refills | Status: AC

## 2021-03-22 MED ORDER — LISINOPRIL-HYDROCHLOROTHIAZIDE 20-12.5 MG PO TABS: 1.0000 | ORAL_TABLET | Freq: Every day | ORAL | 4 refills | Status: DC

## 2021-03-22 MED ORDER — AMLODIPINE BESYLATE 10 MG PO TABS: ORAL_TABLET | ORAL | 4 refills | Status: DC

## 2021-03-22 MED ORDER — TRIAMCINOLONE ACETONIDE 0.1 % EX CREA: 1.0000 "application " | TOPICAL_CREAM | Freq: Two times a day (BID) | CUTANEOUS | 4 refills | Status: AC

## 2021-03-22 MED ORDER — FLUOXETINE HCL 20 MG PO CAPS: ORAL_CAPSULE | ORAL | 4 refills | Status: DC

## 2021-03-22 MED ORDER — CETIRIZINE HCL 10 MG PO TABS: ORAL_TABLET | ORAL | 4 refills | Status: DC

## 2021-03-22 NOTE — Patient Instructions (Signed)
Shoulder Impingement Syndrome Rehab Ask your health care provider which exercises are safe for you. Do exercises exactly as told by your health care provider and adjust them as directed. It is normal to feel mild stretching, pulling, tightness, or discomfort as you do these exercises. Stop right away if you feel sudden pain or your pain gets worse. Do not begin these exercises until told by your health care provider. Stretching and range-of-motion exercise This exercise warms up your muscles and joints and improves the movement and flexibility of your shoulder. This exercise also helps to relieve pain andstiffness. Passive horizontal adduction In passive adduction, you use your other hand to move the injured arm toward your body. The injured arm does not move on its own. In this movement, your arm is moved across your body in the horizontal plane (horizontal adduction). Sit or stand and pull your left / right elbow across your chest, toward your other shoulder. Stop when you feel a gentle stretch in the back of your shoulder and upper arm. Keep your arm at shoulder height. Keep your arm as close to your body as you comfortably can. Hold for __________ seconds. Slowly return to the starting position. Repeat __________ times. Complete this exercise __________ times a day. Strengthening exercises These exercises build strength and endurance in your shoulder. Endurance is theability to use your muscles for a long time, even after they get tired. External rotation, isometric This is an exercise in which you press the back of your wrist against a door frame without moving your shoulder joint (isometric). Stand or sit in a doorway, facing the door frame. Bend your left / right elbow and place the back of your wrist against the door frame. Only the back of your wrist should be touching the frame. Keep your upper arm at your side. Gently press your wrist against the door frame, as if you are trying to push  your arm away from your abdomen (external rotation). Press as hard as you are able without pain. Avoid shrugging your shoulder while you press your wrist against the door frame. Keep your shoulder blade tucked down toward the middle of your back. Hold for __________ seconds. Slowly release the tension, and relax your muscles completely before you repeat the exercise. Repeat __________ times. Complete this exercise __________ times a day. Internal rotation, isometric This is an exercise in which you press your palm against a door frame without moving your shoulder joint (isometric). Stand or sit in a doorway, facing the door frame. Bend your left / right elbow and place the palm of your hand against the door frame. Only your palm should be touching the frame. Keep your upper arm at your side. Gently press your hand against the door frame, as if you are trying to push your arm toward your abdomen (internal rotation). Press as hard as you are able without pain. Avoid shrugging your shoulder while you press your hand against the door frame. Keep your shoulder blade tucked down toward the middle of your back. Hold for __________ seconds. Slowly release the tension, and relax your muscles completely before you repeat the exercise. Repeat __________ times. Complete this exercise __________ times a day. Scapular protraction, supine  Lie on your back on a firm surface (supine position). Hold a __________ weight in your left / right hand. Raise your left / right arm straight into the air so your hand is directly above your shoulder joint. Push the weight into the air so your shoulder (  scapula) lifts off the surface that you are lying on. The scapula will push up or forward (protraction). Do not move your head, neck, or back. Hold for __________ seconds. Slowly return to the starting position. Let your muscles relax completely before you repeat this exercise. Repeat __________ times. Complete this exercise  __________ times a day. Scapular retraction  Sit in a stable chair without armrests, or stand up. Secure an exercise band to a stable object in front of you so the band is at shoulder height. Hold one end of the exercise band in each hand. Your palms should face down. Squeeze your shoulder blades together (retraction) and move your elbows slightly behind you. Do not shrug your shoulders upward while you do this. Hold for __________ seconds. Slowly return to the starting position. Repeat __________ times. Complete this exercise __________ times a day. Shoulder extension  Sit in a stable chair without armrests, or stand up. Secure an exercise band to a stable object in front of you so the band is above shoulder height. Hold one end of the exercise band in each hand. Straighten your elbows and lift your hands up to shoulder height. Squeeze your shoulder blades together and pull your hands down to the sides of your thighs (extension). Stop when your hands are straight down by your sides. Do not let your hands go behind your body. Hold for __________ seconds. Slowly return to the starting position. Repeat __________ times. Complete this exercise __________ times a day. This information is not intended to replace advice given to you by your health care provider. Make sure you discuss any questions you have with your healthcare provider. Document Revised: 12/24/2018 Document Reviewed: 09/27/2018 Elsevier Patient Education  [place].

## 2021-03-22 NOTE — Assessment & Plan Note (Signed)
Acute x 1 1/2 weeks, suspect impingement based on exam findings.  Educated him on this and treatment.  Will send in refills on Meloxicam to use and recommend continue to ice and rest. Provided him with PT exercises to perform at home.  If any worsening or ongoing pain return to office and we may consider imaging and PT at that time.  He agrees with plan of care.

## 2021-03-22 NOTE — Progress Notes (Signed)
BP 100/65   Pulse (!) 59   Temp 98.3 F (36.8 C) (Oral)   Wt 255 lb 9.6 oz (115.9 kg)   SpO2 96%   BMI 45.28 kg/m    Subjective:    Patient ID: Lance Kim, male    DOB: 1970/12/06, 50 y.o.   MRN: [id number]  HPI: Lance Kim is a 50 y.o. male  Chief Complaint  Patient presents with   Shoulder Injury    Patient states about a week and a half ago he noticed some swelling in his left shoulder and states when he sleeps on his left side he will not his shoulder will go numb and have some tingling. Patient states he has a pain that comes and goes. Patient cold compress helps a little. Patient states he has tried Ibuprofen and it helps a little not a lot.    Medication Refill    Patient is requesting refill on his medications.    He is requesting medication refills today.  SHOULDER PAIN Left shoulder pain present for 1 1/2 weeks.  No recent injuries.  No recent heavy lifting.  Has noticed some swelling, when lies on left side has some numbness and tingling.   Duration: weeks Involved shoulder: left Mechanism of injury: unknown Location:  posterior and upper + tingles down to elbow Onset:sudden Severity: 4/10 at present and 9/10 at worst Quality: dull and aching Frequency: intermittent Radiation: yes down to elbow at times Aggravating factors: movement and sleep  Alleviating factors: ice and rest  Status: fluctuating Treatments attempted: rest, ice, and ibuprofen  Relief with NSAIDs?:  no Weakness: no Numbness: no Decreased grip strength: no Redness: no Swelling: yes Bruising: no Fevers: no   Relevant past medical, surgical, family and social history reviewed and updated as indicated. Interim medical history since our last visit reviewed. Allergies and medications reviewed and updated.  Review of Systems  Constitutional:  Negative for activity change, diaphoresis, fatigue and fever.  Respiratory:  Negative for cough, chest tightness, shortness of breath and  wheezing.   Cardiovascular:  Negative for chest pain, palpitations and leg swelling.  Gastrointestinal: Negative.   Musculoskeletal:  Positive for arthralgias.  Neurological: Negative.   Psychiatric/Behavioral: Negative.     Per HPI unless specifically indicated above     Objective:    BP 100/65   Pulse (!) 59   Temp 98.3 F (36.8 C) (Oral)   Wt 255 lb 9.6 oz (115.9 kg)   SpO2 96%   BMI 45.28 kg/m   Wt Readings from Last 3 Encounters:  03/22/21 255 lb 9.6 oz (115.9 kg)  11/23/20 246 lb 3.2 oz (111.7 kg)  05/25/20 226 lb (102.5 kg)    Physical Exam Vitals and nursing note reviewed.  Constitutional:      General: He is awake. He is not in acute distress.    Appearance: He is well-developed and well-groomed. He is obese. He is not ill-appearing or toxic-appearing.  HENT:     Head: Normocephalic and atraumatic.     Right Ear: Hearing normal. No drainage.     Left Ear: Hearing normal. No drainage.  Eyes:     General: Lids are normal.        Right eye: No discharge.        Left eye: No discharge.     Conjunctiva/sclera: Conjunctivae normal.     Pupils: Pupils are equal, round, and reactive to light.  Neck:     Thyroid: No thyromegaly.  Vascular: No carotid bruit.  Cardiovascular:     Rate and Rhythm: Normal rate and regular rhythm.     Heart sounds: Normal heart sounds, S1 normal and S2 normal. No murmur heard.   No gallop.  Pulmonary:     Effort: Pulmonary effort is normal.     Breath sounds: Normal breath sounds.  Abdominal:     General: Bowel sounds are normal.     Palpations: Abdomen is soft.  Musculoskeletal:        General: Normal range of motion.     Cervical back: Normal range of motion and neck supple.     Right lower leg: No edema.     Left lower leg: No edema.  Lymphadenopathy:     Cervical: No cervical adenopathy.  Skin:    General: Skin is warm and dry.     Capillary Refill: Capillary refill takes less than 2 seconds.     Findings: No rash.   Neurological:     Mental Status: He is alert and oriented to person, place, and time.     Deep Tendon Reflexes: Reflexes are normal and symmetric.  Psychiatric:        Attention and Perception: Attention normal.        Mood and Affect: Mood normal.        Speech: Speech normal.        Behavior: Behavior normal. Behavior is cooperative.        Thought Content: Thought content normal.      Shoulder: left    Inspection:  no swelling, ecchymosis, erythema or step off deformity.  Swelling: none  Ecchymosis: none  Erythema: none  Deformity: none      Tenderness to Palpation:    Acromion: no    AC joint:no    Clavicle: no    Bicipital groove: no    Scapular spine: no    Coracoid process: no    Humeral head: no    Supraspinatus tendon: no     Range of Motion:  Full Range of Motion bilaterally    Abduction:Normal    Adduction: Normal although tenderness reported    Flexion: Normal although tenderness reported    Extension: Normal    Internal rotation: Normal    External rotation: Normal although tenderness reported    Painful arc: no     Muscle Strength: 5/5 bilaterally    Flexion: Normal    Extension: Normal    Abduction: Normal    Adduction: Decreased    External rotation: Normal    Internal rotation: Normal     Neuro: Sensation WNL. and Upper extremity reflexes WNL.     Special Tests:     Neer sign: Positive    Hawkins sign: Positive  Results for orders placed or performed in visit on 11/23/20  Bayer DCA Hb A1c Waived  Result Value Ref Range   HB A1C (BAYER DCA - WAIVED) 6.4 <7.0 %  Microalbumin, Urine Waived  Result Value Ref Range   Microalb, Ur Waived 10 0 - 19 mg/L   Creatinine, Urine Waived 100 10 - 300 mg/dL   Microalb/Creat Ratio <30 <30 mg/g  Comprehensive metabolic panel  Result Value Ref Range   Glucose 107 (H) 65 - 99 mg/dL   BUN 10 6 - 24 mg/dL   Creatinine, Ser 1.12 0.76 - 1.27 mg/dL   eGFR 80 >59 mL/min/1.73   BUN/Creatinine Ratio 9 9 - 20    Sodium 137 134 - 144 mmol/L  Potassium 4.3 3.5 - 5.2 mmol/L   Chloride 102 96 - 106 mmol/L   CO2 22 20 - 29 mmol/L   Calcium 9.1 8.7 - 10.2 mg/dL   Total Protein 7.8 6.0 - 8.5 g/dL   Albumin 4.2 3.8 - 4.9 g/dL   Globulin, Total 3.6 1.5 - 4.5 g/dL   Albumin/Globulin Ratio 1.2 1.2 - 2.2   Bilirubin Total 0.2 0.0 - 1.2 mg/dL   Alkaline Phosphatase 88 44 - 121 IU/L   AST 17 0 - 40 IU/L   ALT 22 0 - 44 IU/L  Lipid Panel w/o Chol/HDL Ratio  Result Value Ref Range   Cholesterol, Total 132 100 - 199 mg/dL   Triglycerides 102 0 - 149 mg/dL   HDL 38 (L) >39 mg/dL   VLDL Cholesterol Cal 19 5 - 40 mg/dL   LDL Chol Calc (NIH) 75 0 - 99 mg/dL      Assessment & Plan:   Problem List Items Addressed This Visit       Other   Acute pain of left shoulder    Acute x 1 1/2 weeks, suspect impingement based on exam findings.  Educated him on this and treatment.  Will send in refills on Meloxicam to use and recommend continue to ice and rest. Provided him with PT exercises to perform at home.  If any worsening or ongoing pain return to office and we may consider imaging and PT at that time.  He agrees with plan of care.           Follow up plan: Return if symptoms worsen or fail to improve, for as scheduled in September.

## 2021-05-02 DIAGNOSIS — L400 Psoriasis vulgaris: Secondary | ICD-10-CM | POA: Diagnosis not present

## 2021-05-02 DIAGNOSIS — Z79899 Other long term (current) drug therapy: Secondary | ICD-10-CM | POA: Diagnosis not present

## 2021-05-28 ENCOUNTER — Ambulatory Visit (INDEPENDENT_AMBULATORY_CARE_PROVIDER_SITE_OTHER): Payer: BC Managed Care – PPO | Admitting: Nurse Practitioner

## 2021-05-28 ENCOUNTER — Other Ambulatory Visit: Payer: Self-pay

## 2021-05-28 ENCOUNTER — Encounter: Payer: Self-pay | Admitting: Nurse Practitioner

## 2021-05-28 VITALS — BP 115/75 | HR 57 | Temp 98.8°F | Ht 62.5 in | Wt 253.0 lb

## 2021-05-28 DIAGNOSIS — F325 Major depressive disorder, single episode, in full remission: Secondary | ICD-10-CM

## 2021-05-28 DIAGNOSIS — E1159 Type 2 diabetes mellitus with other circulatory complications: Secondary | ICD-10-CM

## 2021-05-28 DIAGNOSIS — E1169 Type 2 diabetes mellitus with other specified complication: Secondary | ICD-10-CM

## 2021-05-28 DIAGNOSIS — H6123 Impacted cerumen, bilateral: Secondary | ICD-10-CM

## 2021-05-28 DIAGNOSIS — E669 Obesity, unspecified: Secondary | ICD-10-CM | POA: Diagnosis not present

## 2021-05-28 DIAGNOSIS — F17210 Nicotine dependence, cigarettes, uncomplicated: Secondary | ICD-10-CM

## 2021-05-28 DIAGNOSIS — L409 Psoriasis, unspecified: Secondary | ICD-10-CM

## 2021-05-28 DIAGNOSIS — E785 Hyperlipidemia, unspecified: Secondary | ICD-10-CM

## 2021-05-28 DIAGNOSIS — I152 Hypertension secondary to endocrine disorders: Secondary | ICD-10-CM

## 2021-05-28 DIAGNOSIS — Z0000 Encounter for general adult medical examination without abnormal findings: Secondary | ICD-10-CM

## 2021-05-28 DIAGNOSIS — J432 Centrilobular emphysema: Secondary | ICD-10-CM | POA: Diagnosis not present

## 2021-05-28 DIAGNOSIS — N400 Enlarged prostate without lower urinary tract symptoms: Secondary | ICD-10-CM | POA: Diagnosis not present

## 2021-05-28 LAB — MICROALBUMIN, URINE WAIVED
Creatinine, Urine Waived: 50 mg/dL (ref 10–300)
Microalb, Ur Waived: 10 mg/L (ref 0–19)
Microalb/Creat Ratio: <30 mg/g (ref <30)

## 2021-05-28 NOTE — Assessment & Plan Note (Signed)
Followed by dermatology and recently seen.  Continue medication regimen as prescribed by them.

## 2021-05-28 NOTE — Assessment & Plan Note (Addendum)
Chronic, stable, off diabetic medication, doing well. Will schedule an eye exam soon. Monofilament test negative X 10 bilaterally. A1c check today. Discuss eating a healthy diet and engaging in exercise.

## 2021-05-28 NOTE — Assessment & Plan Note (Signed)
Chronic ongoing, no issues with medication. Lipid panel today.

## 2021-05-28 NOTE — Assessment & Plan Note (Signed)
Patient stable doing well on medication. Denies any SI/HI.

## 2021-05-28 NOTE — Patient Instructions (Signed)
Diabetes Mellitus Action Plan °Following a diabetes action plan is a way for you to manage your diabetes (diabetes mellitus) symptoms. The plan is color-coded to help you understand what actions you need to take based on any symptoms you are having. °If you have symptoms in the red zone, you need medical care right away. °If you have symptoms in the yellow zone, you are having problems. °If you have symptoms in the green zone, you are doing well. °Learning about and understanding diabetes can take time. Follow the plan that you develop with your health care provider. Know the target range for your blood sugar (glucose) level, and review your treatment plan with your health care provider at each visit. °The target range for my blood sugar level is __________________________ mg/dL. °Red zone °Get medical help right away if you have any of the following symptoms: °A blood sugar test result that is below 54 mg/dL (3 mmol/L). °A blood sugar test result that is at or above 240 mg/dL (13.3 mmol/L) for 2 days in a row. °Confusion or trouble thinking clearly. °Difficulty breathing. °Sickness or a fever for 2 or more days that is not getting better. °Moderate or large ketone levels in your urine. °Feeling tired or having no energy. °If you have any red zone symptoms, do not wait to see if the symptoms will go away. Get medical help right away. Call your local emergency services (911 in the [place] Do not drive yourself to the hospital. °If you have severely low blood sugar (severe hypoglycemia) and you cannot eat or drink, you may need glucagon. Make sure a family member or close friend knows how to check your blood sugar and how to give you glucagon. You may need to be treated in a hospital for this condition. °Yellow zone °If you have any of the following symptoms, your diabetes is not under control and you may need to make some changes: °A blood sugar test result that is at or above 240 mg/dL (13.3 mmol/L) for 2 days in a  row. °Blood sugar test results that are below 70 mg/dL (3.9 mmol/L). °Other symptoms of hypoglycemia, such as: °Shaking or feeling light-headed. °Confusion or irritability. °Feeling hungry. °Having a fast heartbeat. °If you have any yellow zone symptoms: °Treat your hypoglycemia by eating or drinking 15 grams of a rapid-acting carbohydrate. Follow the 15:15 rule: °Take 15 grams of a rapid-acting carbohydrate, such as: °1 tube of glucose gel. °4 glucose pills. °4 oz (120 mL) of fruit juice. °4 oz (120 mL) of regular (not diet) soda. °Check your blood sugar 15 minutes after you take the carbohydrate. °If the repeat blood sugar test is still at or below 70 mg/dL (3.9 mmol/L), take 15 grams of a carbohydrate again. °If your blood sugar does not increase above 70 mg/dL (3.9 mmol/L) after 3 tries, get medical help right away. °After your blood sugar returns to normal, eat a meal or a snack within 1 hour. °Keep taking your daily medicines as told by your health care provider. °Check your blood sugar more often than you normally would. °Write down your results. °Call your health care provider if you have trouble keeping your blood sugar in your target range. ° °Green zone °These signs mean you are doing well and you can continue what you are doing to manage your diabetes: °Your blood sugar is within your personal target range. For most people, a blood sugar level before a meal (preprandial) should be 80-130 mg/dL (4.4-7.2 mmol/L). °You feel   well, and you are able to do daily activities. °If you are in the green zone, continue to manage your diabetes as told by your health care provider. To do this: °Eat a healthy diet. °Exercise regularly. °Check your blood sugar as told by your health care provider. °Take your medicines as told by your health care provider. ° °Where to find more information °American Diabetes Association (ADA): diabetes.org °Association of Diabetes Care & Education Specialists (ADCES):  diabeteseducator.org °Summary °Following a diabetes action plan is a way for you to manage your diabetes symptoms. The plan is color-coded to help you understand what actions you need to take based on any symptoms you are having. °Follow the plan that you develop with your health care provider. Make sure you know your personal target blood sugar level. °Review your treatment plan with your health care provider at each visit. °This information is not intended to replace advice given to you by your health care provider. Make sure you discuss any questions you have with your health care provider. °Document Revised: 03/08/2020 Document Reviewed: 03/08/2020 °[employer]vier Patient Education © 2022 [employer] ° °

## 2021-05-28 NOTE — Assessment & Plan Note (Signed)
Patient decline cleaning in office today, would prefer to use [employer] at home.

## 2021-05-28 NOTE — Progress Notes (Signed)
BP 115/75   Pulse (!) 57   Temp 98.8 F (37.1 C) (Oral)   Ht 5' 2.5" (1.588 m)   Wt 253 lb (114.8 kg)   SpO2 99%   BMI 45.54 kg/m    Subjective:    Patient ID: Lance Kim, male    DOB: 10-31-1970, 50 y.o.   MRN: [id number]  NOTE WRITTEN BY [employer] DNP STUDENT.  ASSESSMENT AND PLAN OF CARE REVIEWED WITH STUDENT, AGREE WITH ABOVE FINDINGS AND PLAN.   HPI: Lance Kim is a 50 y.o. male presenting on 05/28/2021 for comprehensive medical examination. Current medical complaints include:none   He currently lives with: daughter Interim Problems from his last visit: no  Patient here today for a physical and routine follow up. Diabetes stable and controlled off medication. BP today stable. Patient complaint of having a flare up of generalized psoriasis since last month, he saw dermatology last month and has been using the kenalog cream.   DIABETES Patient's condition has been stable, no medication at this time. A1c check today. Hypoglycemic episodes:no Polydipsia/polyuria: no Visual disturbance: no Chest pain: no Paresthesias: no Glucose Monitoring: no  Accucheck frequency: Not Checking Taking Insulin?: no   Blood Pressure Monitoring: not checking Retinal Examination: Not up to Date Foot Exam: Up to Date Diabetic Education: Completed Pneumovax: Up to Date Influenza: Not up to Date Aspirin: no   COPD Chronic stable, ongoing monitoring. Goes for yearly lung screening. COPD status: controlled Satisfied with current treatment?: yes Oxygen use: no Dyspnea frequency: none Cough frequency: none Limitation of activity: no Productive cough: none   HYPERTENSION / HYPERLIPIDEMIA Chronic ongoing, BP stable on medication. Satisfied with current treatment? yes Duration of hypertension: chronic BP monitoring frequency: not checking BP medication side effects: no Duration of hyperlipidemia: chronic Cholesterol medication side effects: yes Cholesterol supplements:  none Medication compliance: good compliance Aspirin: no Recent stressors: no Recurrent headaches: no Visual changes: no Palpitations: no Dyspnea: no Chest pain: no Lower extremity edema: no Dizzy/lightheaded: no   DEPRESSION Doing good today denies SI/HI Mood status: stable Satisfied with current treatment?: yes Duration of current treatment : chronic Side effects: no Medication compliance: good compliance Psychotherapy/counseling: no  Previous psychiatric medications: prozac Depressed mood: no Anxious mood: no Anhedonia: no Significant weight loss or gain: no Insomnia: no  Fatigue: no Feelings of worthlessness or guilt: no Impaired concentration/indecisiveness: no Suicidal ideations: no Hopelessness: no Crying spells: no Depression screen [facility] 2/9 03/22/2021 11/23/2020 05/25/2020 02/21/2020 08/01/2019  Decreased Interest 0 0 0 0 0  Down, Depressed, Hopeless 0 1 1 0 0  PHQ - 2 Score 0 1 1 0 0  Altered sleeping 0 1 1 0 0  Tired, decreased energy 0 1 0 0 0  Change in appetite 0 0 1 0 0  Feeling bad or failure about yourself  0 0 0 0 0  Trouble concentrating 0 0 0 0 0  Moving slowly or fidgety/restless 0 0 0 0 0  Suicidal thoughts 0 0 0 0 0  PHQ-9 Score 0 3 3 0 0  Difficult doing work/chores Not difficult at all Not difficult at all Not difficult at all Not difficult at all -  Some recent data might be hidden     Functional Status Survey:    FALL RISK: Fall Risk  08/01/2019 07/29/2019 12/13/2018 08/30/2018 08/23/2018  Falls in the past year? 0 0 0 0 (No Data)  Comment - - - - no falls past wk  Number falls in past  yr: 0 - 0 - -  Comment - - - - -  Injury with Fall? 0 - 0 - -  Follow up - - Falls evaluation completed - -   Advanced Directives <no information>  Past Medical History:  Past Medical History:  Diagnosis Date   Anxiety    Depression    Diabetes mellitus without complication ([place])    Hyperlipidemia    Hypertension    Hypertensive renal disease  without failure    Tobacco use disorder     Surgical History:  Past Surgical History:  Procedure Laterality Date   COLONOSCOPY WITH PROPOFOL N/A 09/02/2019   Procedure: COLONOSCOPY WITH PROPOFOL;  Surgeon: [name], MD;  Location: [facility] ENDOSCOPY;  Service: Gastroenterology;  Laterality: N/A;   WISDOM TOOTH EXTRACTION      Medications:  Current Outpatient Medications on File Prior to Visit  Medication Sig   amLODipine (NORVASC) 10 MG tablet TAKE 1 TABLET(10 MG) BY MOUTH DAILY   Apremilast 30 MG TABS Take 1 tablet by mouth 2 (two) times daily.   aspirin EC 81 MG tablet Take 81 mg by mouth daily.   atorvastatin (LIPITOR) 20 MG tablet TAKE 1 TABLET(20 MG) BY MOUTH DAILY   cetirizine (ZYRTEC) 10 MG tablet TAKE 1 TABLET(10 MG) BY MOUTH DAILY   CONTOUR NEXT TEST test strip USE TO TEST SUGAR TWICE DAILY   FIBER ADULT GUMMIES PO Take 2 tablets by mouth daily.    FLUoxetine (PROZAC) 20 MG capsule TAKE 1 CAPSULE(20 MG) BY MOUTH DAILY   fluticasone (FLONASE) 50 MCG/ACT nasal spray SHAKE LIQUID AND USE 2 SPRAYS IN EACH NOSTRIL TWICE DAILY   HALOG 0.1 % CREA Apply 1 application topically in the morning and at bedtime.   lisinopril-hydrochlorothiazide (ZESTORETIC) 20-12.5 MG tablet Take 1 tablet by mouth daily.   meloxicam (MOBIC) 7.5 MG tablet Take 2 tablets (15 mg total) by mouth daily. TAKE 2 TABLETS DAILY   Microlet Lancets MISC USE TO TEST SUGAR TWICE DAILY   Multiple Vitamin (MULTIVITAMIN) tablet Take 2 tablets by mouth daily.    ondansetron (ZOFRAN-ODT) 4 MG disintegrating tablet DISSOLVE 1 TABLET ON THE TONGUE EVERY 8 HOURS AS NEEDED FOR NAUSEA   triamcinolone cream (KENALOG) 0.1 % Apply 1 application topically 2 (two) times daily.   UNABLE TO FIND Lancets (Contour Choice)   No current facility-administered medications on file prior to visit.    Allergies:  Allergies  Allergen Reactions   Mevacor [Lovastatin] Other (See Comments)    CRAMPS    Social History:  Social History    Socioeconomic History   Marital status: Legally Separated    Spouse name: Not on file   Number of children: Not on file   Years of education: Not on file   Highest education level: Not on file  Occupational History   Not on file  Tobacco Use   Smoking status: Every Day    Packs/day: 0.75    Years: 32.00    Pack years: 24.00    Types: Cigarettes   Smokeless tobacco: Never   Tobacco comments:    .5ppd now  Vaping Use   Vaping Use: Never used  Substance and Sexual Activity   Alcohol use: Yes    Alcohol/week: 2.0 standard drinks    Types: 1 Glasses of wine, 1 Cans of beer per week    Comment: on occasion   Drug use: No   Sexual activity: Yes    Partners: Female    Comment: satidfied with sexual  function  Other Topics Concern   Not on file  Social History Narrative   Not on file   Social Determinants of Health   Financial Resource Strain: Low Risk    Difficulty of Paying Living Expenses: Not very hard  Food Insecurity: No Food Insecurity   Worried About Running Out of Food in the Last Year: Never true   Ran Out of Food in the Last Year: Never true  Transportation Needs: No Transportation Needs   Lack of Transportation (Medical): No   Lack of Transportation (Non-Medical): No  Physical Activity: Inactive   Days of Exercise per Week: 0 days   Minutes of Exercise per Session: 0 min  Stress: No Stress Concern Present   Feeling of Stress : Not at all  Social Connections: Not on file  Intimate Partner Violence: Not At Risk   Fear of Current or Ex-Partner: No   Emotionally Abused: No   Physically Abused: No   Sexually Abused: No   Social History   Tobacco Use  Smoking Status Every Day   Packs/day: 0.75   Years: 32.00   Pack years: 24.00   Types: Cigarettes  Smokeless Tobacco Never  Tobacco Comments   .5ppd now   Social History   Substance and Sexual Activity  Alcohol Use Yes   Alcohol/week: 2.0 standard drinks   Types: 1 Glasses of wine, 1 Cans of beer  per week   Comment: on occasion    Family History:  Family History  Problem Relation Age of Onset   Diabetes Mother    Heart Problems Mother    Emphysema Father    Cancer Son    Cancer Maternal Grandmother    Prostate cancer Neg Hx    Bladder Cancer Neg Hx    Kidney cancer Neg Hx     Past medical history, surgical history, medications, allergies, family history and social history reviewed with patient today and changes made to appropriate areas of the chart.   Review of Systems  Constitutional:  Negative for chills, fever and weight loss.  HENT:  Negative for ear pain, hearing loss and tinnitus.   Eyes:  Negative for blurred vision, pain and redness.  Respiratory:  Negative for cough and shortness of breath.   Cardiovascular:  Negative for chest pain, palpitations and leg swelling.  Gastrointestinal:  Negative for abdominal pain, constipation, diarrhea, nausea and vomiting.  Genitourinary:  Negative for frequency and urgency.  Musculoskeletal:  Negative for back pain, joint pain and myalgias.  Skin:  Positive for rash.  Neurological:  Negative for dizziness, tingling, tremors and headaches.  Endo/Heme/Allergies:  Negative for polydipsia.  Psychiatric/Behavioral:  Negative for depression and suicidal ideas. The patient is not nervous/anxious.    All other ROS negative except what is listed above and in the HPI.      Objective:    BP 115/75   Pulse (!) 57   Temp 98.8 F (37.1 C) (Oral)   Ht 5' 2.5" (1.588 m)   Wt 253 lb (114.8 kg)   SpO2 99%   BMI 45.54 kg/m   Wt Readings from Last 3 Encounters:  05/28/21 253 lb (114.8 kg)  03/22/21 255 lb 9.6 oz (115.9 kg)  11/23/20 246 lb 3.2 oz (111.7 kg)    Physical Exam Vitals and nursing note reviewed.  Constitutional:      Appearance: Normal appearance. He is well-developed. He is obese. He is not ill-appearing or toxic-appearing.  HENT:     Head: Normocephalic.  Right Ear: Hearing normal.     Left Ear: Hearing and  tympanic membrane normal.     Ears:     Comments: Patient has a lot of cerumen in right ear, made it difficult to visualize the TM Eyes:     General: Lids are normal.  Neck:     Thyroid: No thyroid mass, thyromegaly or thyroid tenderness.     Trachea: Trachea normal.  Cardiovascular:     Rate and Rhythm: Normal rate and regular rhythm.     Pulses: Normal pulses.     Heart sounds: Normal heart sounds, S1 normal and S2 normal.  Pulmonary:     Effort: Pulmonary effort is normal. No respiratory distress.     Breath sounds: Normal breath sounds.  Abdominal:     General: Bowel sounds are normal. There is no distension.     Palpations: Abdomen is soft.     Hernia: No hernia is present.  Musculoskeletal:     Cervical back: Full passive range of motion without pain.  Feet:     Right foot:     Skin integrity: No ulcer, skin breakdown or dry skin.     Left foot:     Skin integrity: No ulcer, skin breakdown or dry skin.  Lymphadenopathy:     Cervical: No cervical adenopathy.     Right cervical: No superficial, deep or posterior cervical adenopathy.    Left cervical: No superficial, deep or posterior cervical adenopathy.  Skin:    Findings: No rash.     Comments: Patient had psoriasis flare up in June, went to see dermatology and has been using kenalog cream.  Neurological:     Mental Status: He is alert.  Psychiatric:        Behavior: Behavior is cooperative.   Diabetic Foot Exam - Simple   Simple Foot Form Diabetic Foot exam was performed with the following findings: Yes 05/28/2021  3:30 PM  Visual Inspection No deformities, no ulcerations, no other skin breakdown bilaterally: Yes Sensation Testing Intact to touch and monofilament testing bilaterally: Yes Pulse Check Posterior Tibialis and Dorsalis pulse intact bilaterally: Yes Comments     Results for orders placed or performed in visit on 05/28/21  Microalbumin, Urine Waived  Result Value Ref Range   Microalb, Ur Waived 10  0 - 19 mg/L   Creatinine, Urine Waived 50 10 - 300 mg/dL   Microalb/Creat Ratio <30 <30 mg/g      Assessment & Plan:   Problem List Items Addressed This Visit       Cardiovascular and Mediastinum   Hypertension associated with type 2 diabetes mellitus ([place])    Chronic stable, patient has been taking his BP medication as prescribed. BP today in clinic stable. CMP today, return in 6 months.      Relevant Orders   Comprehensive metabolic panel   HgB 123456   TSH   Microalbumin, Urine Waived (Completed)     Respiratory   Centrilobular emphysema (HCC)    Noted on CT imaging.  FEV1 99% and FEV1/FVC 95% last visit.  No current inhalers.  Recommend complete cessation of smoking and initiate inhalers as needed.      Relevant Orders   CBC with Differential/Platelet     Endocrine   Hyperlipidemia associated with type 2 diabetes mellitus ([place])    Chronic ongoing, no issues with medication. Lipid panel today.      Relevant Orders   HgB A1c   Lipid Panel w/o Chol/HDL Ratio  Type 2 diabetes mellitus with obesity ([facility]) - Primary    Chronic, stable, off diabetic medication, doing well. Will schedule an eye exam soon. Monofilament test negative X 10 bilaterally. A1c check today. Discuss eating a healthy diet and engaging in exercise.      Relevant Orders   HgB A1c   Microalbumin, Urine Waived (Completed)     Nervous and Auditory   Bilateral impacted cerumen    Patient decline cleaning in office today, would prefer to use Debrox at home.        Musculoskeletal and Integument   Psoriasis    Followed by dermatology and recently seen.  Continue medication regimen as prescribed by them.        Other   Depression    Patient stable doing well on medication. Denies any SI/HI.      Cigarette nicotine dependence without complication    Patient stated he was about to quit last 08-24-23 and his mom died so he continued to smoke. Discussed importance of quitting, patient verbalized  understanding and will work towards quitting.      Other Visit Diagnoses     Benign prostatic hyperplasia without lower urinary tract symptoms       PSA check today   Relevant Orders   PSA   Annual physical exam       Annual physical today with labs.  Health maintenance reviewed.        Discussed aspirin prophylaxis for myocardial infarction prevention and decision was it was not indicated  LABORATORY TESTING:  Health maintenance labs ordered today as discussed above.   The natural history of prostate cancer and ongoing controversy regarding screening and potential treatment outcomes of prostate cancer has been discussed with the patient. The meaning of a false positive PSA and a false negative PSA has been discussed. He indicates understanding of the limitations of this screening test and wishes to proceed with screening PSA testing.   IMMUNIZATIONS:   - Tdap: Tetanus vaccination status reviewed: last tetanus booster within 10 years. - Influenza: Postponed to flu season - Pneumovax: Up to date - Prevnar: Up to date - Zostavax vaccine: Refused  SCREENING: - Colonoscopy: Up to date  Discussed with patient purpose of the colonoscopy is to detect colon cancer at curable precancerous or early stages   - AAA Screening: Not applicable  -Hearing Test: Not applicable  -Spirometry: Not applicable   PATIENT COUNSELING:    Sexuality: Discussed sexually transmitted diseases, partner selection, use of condoms, avoidance of unintended pregnancy  and contraceptive alternatives.   Advised to avoid cigarette smoking.  I discussed with the patient that most people either abstain from alcohol or drink within safe limits (<=14/week and <=4 drinks/occasion for males, <=7/weeks and <= 3 drinks/occasion for females) and that the risk for alcohol disorders and other health effects rises proportionally with the number of drinks per week and how often a drinker exceeds daily limits.  Discussed  cessation/primary prevention of drug use and availability of treatment for abuse.   Diet: Encouraged to adjust caloric intake to maintain  or achieve ideal body weight, to reduce intake of dietary saturated fat and total fat, to limit sodium intake by avoiding high sodium foods and not adding table salt, and to maintain adequate dietary potassium and calcium preferably from fresh fruits, vegetables, and low-fat dairy products.    Stressed the importance of regular exercise  Injury prevention: Discussed safety belts, safety helmets, smoke detector, smoking near bedding or upholstery.  Dental health: Discussed importance of regular tooth brushing, flossing, and dental visits.   Follow up plan: NEXT PREVENTATIVE PHYSICAL DUE IN 1 YEAR. Return in about 6 months (around 11/25/2021) for T2DM, HTN/HLD, MOOD, COPD -- spirometry needed.

## 2021-05-28 NOTE — Assessment & Plan Note (Signed)
Chronic stable, patient has been taking his BP medication as prescribed. BP today in clinic stable. CMP today, return in 6 months.

## 2021-05-28 NOTE — Assessment & Plan Note (Addendum)
Noted on CT imaging.  FEV1 99% and FEV1/FVC 95% last visit.  No current inhalers.  Recommend complete cessation of smoking and initiate inhalers as needed.

## 2021-05-28 NOTE — Assessment & Plan Note (Signed)
Patient stated he was about to quit last August 16, 2023 and his mom died so he continued to smoke. Discussed importance of quitting, patient verbalized understanding and will work towards quitting.

## 2021-05-29 LAB — CBC WITH DIFFERENTIAL/PLATELET
Basophils Absolute: 0.1 10*3/uL (ref 0.0–0.2)
Basos: 1 %
EOS (ABSOLUTE): 0.2 10*3/uL (ref 0.0–0.4)
Eos: 3 %
Hematocrit: 44.8 % (ref 37.5–51.0)
Hemoglobin: 15 g/dL (ref 13.0–17.7)
Immature Grans (Abs): 0 10*3/uL (ref 0.0–0.1)
Immature Granulocytes: 0 %
Lymphocytes Absolute: 3 10*3/uL (ref 0.7–3.1)
Lymphs: 42 %
MCH: 28.5 pg (ref 26.6–33.0)
MCHC: 33.5 g/dL (ref 31.5–35.7)
MCV: 85 fL (ref 79–97)
Monocytes Absolute: 0.7 10*3/uL (ref 0.1–0.9)
Monocytes: 9 %
Neutrophils Absolute: 3.2 10*3/uL (ref 1.4–7.0)
Neutrophils: 45 %
Platelets: 285 10*3/uL (ref 150–450)
RBC: 5.27 x10E6/uL (ref 4.14–5.80)
RDW: 13 % (ref 11.6–15.4)
WBC: 7.1 10*3/uL (ref 3.4–10.8)

## 2021-05-29 LAB — TSH: TSH: 1.85 u[IU]/mL (ref 0.450–4.500)

## 2021-05-29 LAB — COMPREHENSIVE METABOLIC PANEL
ALT: 30 IU/L (ref 0–44)
AST: 25 IU/L (ref 0–40)
Albumin/Globulin Ratio: 1.6 (ref 1.2–2.2)
Albumin: 4.5 g/dL (ref 3.8–4.9)
Alkaline Phosphatase: 95 IU/L (ref 44–121)
BUN/Creatinine Ratio: 7 — ABNORMAL LOW (ref 9–20)
BUN: 8 mg/dL (ref 6–24)
Bilirubin Total: 0.3 mg/dL (ref 0.0–1.2)
CO2: 23 mmol/L (ref 20–29)
Calcium: 9.6 mg/dL (ref 8.7–10.2)
Chloride: 99 mmol/L (ref 96–106)
Creatinine, Ser: 1.07 mg/dL (ref 0.76–1.27)
Globulin, Total: 2.9 g/dL (ref 1.5–4.5)
Glucose: 78 mg/dL (ref 65–99)
Potassium: 4.2 mmol/L (ref 3.5–5.2)
Sodium: 137 mmol/L (ref 134–144)
Total Protein: 7.4 g/dL (ref 6.0–8.5)
eGFR: 84 mL/min/{1.73_m2} (ref >59)

## 2021-05-29 LAB — LIPID PANEL W/O CHOL/HDL RATIO
Cholesterol, Total: 137 mg/dL (ref 100–199)
HDL: 39 mg/dL — ABNORMAL LOW (ref >39)
LDL Chol Calc (NIH): 77 mg/dL (ref 0–99)
Triglycerides: 112 mg/dL (ref 0–149)
VLDL Cholesterol Cal: 21 mg/dL (ref 5–40)

## 2021-05-29 LAB — HEMOGLOBIN A1C
Est. average glucose Bld gHb Est-mCnc: 146 mg/dL
Hgb A1c MFr Bld: 6.7 % — ABNORMAL HIGH (ref 4.8–5.6)

## 2021-05-29 LAB — PSA: Prostate Specific Ag, Serum: 0.5 ng/mL (ref 0.0–4.0)

## 2021-05-29 NOTE — Progress Notes (Signed)
Contacted via [facility] morning Lance Kim, your labs have returned and overall look fabulous.  The only thing I noticed is your A1c is creeping back up.  At 6.7% at this time, previous was 6.4%.  We can manage this two ways: #1 you could focus on diet and regular activity to get this back down OR #2 we could restart a low dose of Metformin 500 MG daily.  Which direction would you like to take?  Let me know, continue all other medications.  Any questions? Keep being amazing!!  Thank you for allowing me to participate in your care.  I appreciate you. Kindest regards, Aodhan Scheidt

## 2021-07-08 ENCOUNTER — Other Ambulatory Visit: Payer: Self-pay | Admitting: *Deleted

## 2021-07-08 DIAGNOSIS — F17210 Nicotine dependence, cigarettes, uncomplicated: Secondary | ICD-10-CM

## 2021-07-22 ENCOUNTER — Other Ambulatory Visit: Payer: Self-pay | Admitting: Nurse Practitioner

## 2021-07-22 NOTE — Telephone Encounter (Signed)
Called pharmacy, has refills available, will refill and have available.

## 2021-08-01 ENCOUNTER — Ambulatory Visit
Admission: RE | Admit: 2021-08-01 | Discharge: 2021-08-01 | Disposition: A | Discharge status: Home / Self Care | Payer: BC Managed Care – PPO | Source: Ambulatory Visit | Attending: Acute Care | Admitting: Acute Care

## 2021-08-01 ENCOUNTER — Other Ambulatory Visit: Payer: Self-pay

## 2021-08-01 DIAGNOSIS — F17210 Nicotine dependence, cigarettes, uncomplicated: Secondary | ICD-10-CM | POA: Insufficient documentation

## 2021-08-05 ENCOUNTER — Other Ambulatory Visit: Payer: Self-pay | Admitting: Acute Care

## 2021-08-05 DIAGNOSIS — Z87891 Personal history of nicotine dependence: Secondary | ICD-10-CM

## 2021-08-05 DIAGNOSIS — F17210 Nicotine dependence, cigarettes, uncomplicated: Secondary | ICD-10-CM

## 2021-08-05 DIAGNOSIS — F17200 Nicotine dependence, unspecified, uncomplicated: Secondary | ICD-10-CM

## 2021-08-07 ENCOUNTER — Ambulatory Visit: Payer: Self-pay | Admitting: *Deleted

## 2021-08-07 NOTE — Telephone Encounter (Signed)
Reason for Disposition  [1] MODERATE pain (e.g., interferes with normal activities) AND [2] present > 3 days  Answer Assessment - Initial Assessment Questions 1. ONSET: "When did the pain start?"     2 months ago, gradually worsening 2. LOCATION: "Where is the pain located?"     Left shoulder 3. PAIN: "How bad is the pain?" (Scale 1-10; or mild, moderate, severe)   - MILD (1-3): doesn't interfere with normal activities   - MODERATE (4-7): interferes with normal activities (e.g., work or school) or awakens from sleep   - SEVERE (8-10): excruciating pain, unable to do any normal activities, unable to move arm at all due to pain     7/10, varies.Better with some positioning 4. WORK OR EXERCISE: "Has there been any recent work or exercise that involved this part of the body?"     no 5. CAUSE: "What do you think is causing the shoulder pain?"     MAybe arthritis. 6. OTHER SYMPTOMS: "Do you have any other symptoms?" (e.g., neck pain, swelling, rash, fever, numbness, weakness)     Numbness at times, radiates to elbow at times 7. PREGNANCY: "Is there any chance you are pregnant?" "When was your last menstrual period?"     *No Answer*  Protocols used: Shoulder Pain-A-AH

## 2021-08-07 NOTE — Telephone Encounter (Signed)
Pt reports left shoulder pain x 2 months,gradually worsening. States radiates to elbow at times, at times mild swelling. Pain relieved with ice, "IcyHot patches." Does not recall any injury, no repetitive work or exercise. Reports H/O Psoriatic Arthritis.. Rates pain at 7/10 at times "Varies, some positions help pain." Denies any CP. Appt made with Lauren for Mon 08/12/21, next available. CAre advise given, pt verbalizes understanding.

## 2021-08-12 ENCOUNTER — Ambulatory Visit
Admission: RE | Admit: 2021-08-12 | Discharge: 2021-08-12 | Disposition: A | Discharge status: Home / Self Care | Payer: BC Managed Care – PPO | Attending: Nurse Practitioner | Admitting: Nurse Practitioner

## 2021-08-12 ENCOUNTER — Other Ambulatory Visit: Payer: Self-pay

## 2021-08-12 ENCOUNTER — Encounter: Payer: Self-pay | Admitting: Nurse Practitioner

## 2021-08-12 ENCOUNTER — Ambulatory Visit: Payer: BC Managed Care – PPO | Admitting: Nurse Practitioner

## 2021-08-12 ENCOUNTER — Ambulatory Visit
Admission: RE | Admit: 2021-08-12 | Discharge: 2021-08-12 | Disposition: A | Discharge status: Home / Self Care | Payer: BC Managed Care – PPO | Source: Ambulatory Visit | Attending: Nurse Practitioner | Admitting: Nurse Practitioner

## 2021-08-12 VITALS — BP 122/79 | HR 61 | Wt 252.4 lb

## 2021-08-12 DIAGNOSIS — M2578 Osteophyte, vertebrae: Secondary | ICD-10-CM | POA: Diagnosis not present

## 2021-08-12 DIAGNOSIS — G8929 Other chronic pain: Secondary | ICD-10-CM | POA: Insufficient documentation

## 2021-08-12 DIAGNOSIS — M47812 Spondylosis without myelopathy or radiculopathy, cervical region: Secondary | ICD-10-CM | POA: Diagnosis not present

## 2021-08-12 DIAGNOSIS — M19012 Primary osteoarthritis, left shoulder: Secondary | ICD-10-CM | POA: Diagnosis not present

## 2021-08-12 DIAGNOSIS — M25512 Pain in left shoulder: Secondary | ICD-10-CM

## 2021-08-12 MED ORDER — GABAPENTIN 300 MG PO CAPS: 300.0000 mg | ORAL_CAPSULE | Freq: Every day | ORAL | 0 refills | Status: DC

## 2021-08-12 NOTE — Assessment & Plan Note (Signed)
Ongoing for 2 months, after resolving in July 2022. Suspect impingement based on history and exam. Will check x-ray of cervical spine and left shoulder. Continue meloxicam, tylenol, and heat. Exercises printed and given to do daily at home as he would like to hold off on PT for now. Will also give gabapentin 300mg  qHS for pain. Follow up in 4 weeks.

## 2021-08-12 NOTE — Progress Notes (Signed)
Acute Office Visit  Subjective:    Patient ID: Lance Kim, male    DOB: [birth year], 50 y.o.   MRN: [id number]  Chief Complaint  Patient presents with   Shoulder Pain    Patient states he is here for pain he is currently having in his left shoulder that radiates down to his elbow. Patient state the pain comes and goes. Patient states when the pain starts it starts as a dull ache. Patient states if he lays on his stomach at night and wakes up his arm will be numb.     HPI Patient is in today for left shoulder pain. He was seen in July 2022 for the same shoulder pain, but he said it went away completely and then started hurting again in September.   SHOULDER PAIN  Duration: months Involved shoulder: left Mechanism of injury: unknown Location:  top of shoulder near neck Onset:sudden Severity: 2/10  Quality:  aching and throbbing Frequency: intermittent Radiation: yes - down to elbow Aggravating factors: sleep  Alleviating factors:  heat, APAP, and NSAIDs  Status: fluctuating Treatments attempted: ice, heat, APAP, and ibuprofen  Relief with NSAIDs?:  mild Weakness: no Numbness: no Decreased grip strength: no Redness: yes - a couple weeks ago Swelling: yes - a couple weeks ago Bruising: no Fevers: no  Past Medical History:  Diagnosis Date   Anxiety    Depression    Diabetes mellitus without complication ([place])    Hyperlipidemia    Hypertension    Hypertensive renal disease without failure    Tobacco use disorder     Past Surgical History:  Procedure Laterality Date   COLONOSCOPY WITH PROPOFOL N/A 09/02/2019   Procedure: COLONOSCOPY WITH PROPOFOL;  Surgeon: [name], MD;  Location: [facility] ENDOSCOPY;  Service: Gastroenterology;  Laterality: N/A;   WISDOM TOOTH EXTRACTION      Family History  Problem Relation Age of Onset   Diabetes Mother    Heart Problems Mother    Emphysema Father    Cancer Son    Cancer Maternal Grandmother    Prostate cancer Neg Hx     Bladder Cancer Neg Hx    Kidney cancer Neg Hx     Social History   Socioeconomic History   Marital status: Legally Separated    Spouse name: Not on file   Number of children: Not on file   Years of education: Not on file   Highest education level: Not on file  Occupational History   Not on file  Tobacco Use   Smoking status: Every Day    Packs/day: 0.75    Years: 32.00    Pack years: 24.00    Types: Cigarettes   Smokeless tobacco: Never   Tobacco comments:    .5ppd now  Vaping Use   Vaping Use: Never used  Substance and Sexual Activity   Alcohol use: Yes    Alcohol/week: 2.0 standard drinks    Types: 1 Glasses of wine, 1 Cans of beer per week    Comment: on occasion   Drug use: No   Sexual activity: Yes    Partners: Female    Comment: satidfied with sexual function  Other Topics Concern   Not on file  Social History Narrative   Not on file   Social Determinants of Health   Financial Resource Strain: Low Risk    Difficulty of Paying Living Expenses: Not very hard  Food Insecurity: No Food Insecurity   Worried About Running Out of  Food in the Last Year: Never true   [place] in the Last Year: Never true  Transportation Needs: No Transportation Needs   Lack of Transportation (Medical): No   Lack of Transportation (Non-Medical): No  Physical Activity: Inactive   Days of Exercise per Week: 0 days   Minutes of Exercise per Session: 0 min  Stress: No Stress Concern Present   Feeling of Stress : Not at all  Social Connections: Not on file  Intimate Partner Violence: Not At Risk   Fear of Current or Ex-Partner: No   Emotionally Abused: No   Physically Abused: No   Sexually Abused: No    Outpatient Medications Prior to Visit  Medication Sig Dispense Refill   amLODipine (NORVASC) 10 MG tablet TAKE 1 TABLET(10 MG) BY MOUTH DAILY 90 tablet 4   Apremilast 30 MG TABS Take 1 tablet by mouth 2 (two) times daily.     aspirin EC 81 MG tablet Take 81 mg by  mouth daily.     atorvastatin (LIPITOR) 20 MG tablet TAKE 1 TABLET(20 MG) BY MOUTH DAILY 90 tablet 4   cetirizine (ZYRTEC) 10 MG tablet TAKE 1 TABLET(10 MG) BY MOUTH DAILY 90 tablet 4   CONTOUR NEXT TEST test strip USE TO TEST SUGAR TWICE DAILY 100 strip 1   FIBER ADULT GUMMIES PO Take 2 tablets by mouth daily.      FLUoxetine (PROZAC) 20 MG capsule TAKE 1 CAPSULE(20 MG) BY MOUTH DAILY 90 capsule 4   fluticasone (FLONASE) 50 MCG/ACT nasal spray SHAKE LIQUID AND USE 2 SPRAYS IN EACH NOSTRIL TWICE DAILY 16 g 4   HALOG 0.1 % CREA Apply 1 application topically in the morning and at bedtime.     lisinopril-hydrochlorothiazide (ZESTORETIC) 20-12.5 MG tablet Take 1 tablet by mouth daily. 90 tablet 4   meloxicam (MOBIC) 7.5 MG tablet Take 2 tablets (15 mg total) by mouth daily. TAKE 2 TABLETS DAILY 180 tablet 2   Microlet Lancets MISC USE TO TEST SUGAR TWICE DAILY 100 each 6   Multiple Vitamin (MULTIVITAMIN) tablet Take 2 tablets by mouth daily.      ondansetron (ZOFRAN-ODT) 4 MG disintegrating tablet DISSOLVE 1 TABLET ON THE TONGUE EVERY 8 HOURS AS NEEDED FOR NAUSEA 20 tablet 2   triamcinolone cream (KENALOG) 0.1 % Apply 1 application topically 2 (two) times daily. 453.6 g 4   UNABLE TO FIND Lancets ([facility] Choice)     No facility-administered medications prior to visit.    Allergies  Allergen Reactions   Mevacor [Lovastatin] Other (See Comments)    CRAMPS    Review of Systems  Constitutional: Negative.   Respiratory: Negative.    Cardiovascular: Negative.   Musculoskeletal:  Positive for arthralgias (left shoulder pain).  Skin: Negative.   Neurological: Negative.   Psychiatric/Behavioral:  The patient is nervous/anxious.       Objective:    Physical Exam Vitals and nursing note reviewed.  Constitutional:      Appearance: Normal appearance.  HENT:     Head: Normocephalic.  Eyes:     Conjunctiva/sclera: Conjunctivae normal.  Cardiovascular:     Rate and Rhythm: Normal rate and  regular rhythm.     Pulses: Normal pulses.     Heart sounds: Normal heart sounds.  Pulmonary:     Effort: Pulmonary effort is normal.     Breath sounds: Normal breath sounds.  Musculoskeletal:        General: No tenderness. Normal range of motion.  Cervical back: Normal range of motion.     Comments: Negative spurling test Pain with empty can test on left side  Skin:    General: Skin is warm.  Neurological:     General: No focal deficit present.     Mental Status: He is alert and oriented to person, place, and time.  Psychiatric:        Mood and Affect: Mood normal.        Behavior: Behavior normal.        Thought Content: Thought content normal.        Judgment: Judgment normal.    BP 122/79   Pulse 61   Wt 252 lb 6.4 oz (114.5 kg)   SpO2 98%   BMI 45.43 kg/m  Wt Readings from Last 3 Encounters:  08/12/21 252 lb 6.4 oz (114.5 kg)  08/01/21 245 lb (111.1 kg)  05/28/21 253 lb (114.8 kg)    Health Maintenance Due  Topic Date Due   OPHTHALMOLOGY EXAM  05/15/2021    There are no preventive care reminders to display for this patient.   Lab Results  Component Value Date   TSH 1.850 05/28/2021   Lab Results  Component Value Date   WBC 7.1 05/28/2021   HGB 15.0 05/28/2021   HCT 44.8 05/28/2021   MCV 85 05/28/2021   PLT 285 05/28/2021   Lab Results  Component Value Date   NA 137 05/28/2021   K 4.2 05/28/2021   CO2 23 05/28/2021   GLUCOSE 78 05/28/2021   BUN 8 05/28/2021   CREATININE 1.07 05/28/2021   BILITOT 0.3 05/28/2021   ALKPHOS 95 05/28/2021   AST 25 05/28/2021   ALT 30 05/28/2021   PROT 7.4 05/28/2021   ALBUMIN 4.5 05/28/2021   CALCIUM 9.6 05/28/2021   ANIONGAP 11 10/24/2016   EGFR 84 05/28/2021   Lab Results  Component Value Date   CHOL 137 05/28/2021   Lab Results  Component Value Date   HDL 39 (L) 05/28/2021   Lab Results  Component Value Date   LDLCALC 77 05/28/2021   Lab Results  Component Value Date   TRIG 112 05/28/2021    Lab Results  Component Value Date   CHOLHDL 4.3 07/14/2018   Lab Results  Component Value Date   HGBA1C 6.7 (H) 05/28/2021       Assessment & Plan:   Problem List Items Addressed This Visit       Other   Chronic left shoulder pain - Primary    Ongoing for 2 months, after resolving in July 2022. Suspect impingement based on history and exam. Will check x-ray of cervical spine and left shoulder. Continue meloxicam, tylenol, and heat. Exercises printed and given to do daily at home as he would like to hold off on PT for now. Will also give gabapentin 383m qHS for pain. Follow up in 4 weeks.       Relevant Medications   gabapentin (NEURONTIN) 300 MG capsule   Other Relevant Orders   DG Cervical Spine Complete   DG Shoulder Left     Meds ordered this encounter  Medications   DISCONTD: gabapentin (NEURONTIN) 300 MG capsule    Sig: Take 1 capsule (300 mg total) by mouth at bedtime.    Dispense:  30 capsule    Refill:  0   gabapentin (NEURONTIN) 300 MG capsule    Sig: Take 1 capsule (300 mg total) by mouth at bedtime.    Dispense:  30 capsule  Refill:  0      Charyl Dancer, NP

## 2021-09-10 ENCOUNTER — Encounter: Payer: Self-pay | Admitting: Nurse Practitioner

## 2021-09-10 ENCOUNTER — Ambulatory Visit: Payer: BC Managed Care – PPO | Admitting: Nurse Practitioner

## 2021-09-10 ENCOUNTER — Other Ambulatory Visit: Payer: Self-pay

## 2021-09-10 DIAGNOSIS — M25512 Pain in left shoulder: Secondary | ICD-10-CM | POA: Diagnosis not present

## 2021-09-10 DIAGNOSIS — G8929 Other chronic pain: Secondary | ICD-10-CM

## 2021-09-10 NOTE — Patient Instructions (Signed)
Voltaren gel as needed for neck discomfort -- Icy/Hot is another option, plus heat.  Shoulder Pain Many things can cause shoulder pain, including: An injury. Moving the shoulder in the same way again and again (overuse). Joint pain (arthritis). Pain can come from: Swelling and irritation (inflammation) of any part of the shoulder. An injury to the shoulder joint. An injury to: Tissues that connect muscle to bone (tendons). Tissues that connect bones to each other (ligaments). Bones. Follow these instructions at home: Watch for changes in your symptoms. Let your doctor know about them. Follow these instructions to help with your pain. If you have a sling: Wear the sling as told by your doctor. Remove it only as told by your doctor. Loosen the sling if your fingers: Tingle. Become numb. Turn cold and blue. Keep the sling clean. If the sling is not waterproof: Do not let it get wet. Take the sling off when you shower or bathe. Managing pain, stiffness, and swelling  If told, put ice on the painful area: Put ice in a plastic bag. Place a towel between your skin and the bag. Leave the ice on for 20 minutes, 2-3 times a day. Stop putting ice on if it does not help with the pain. Squeeze a soft ball or a foam pad as much as possible. This prevents swelling in the shoulder. It also helps to strengthen the arm. General instructions Take over-the-counter and prescription medicines only as told by your doctor. Keep all follow-up visits as told by your doctor. This is important. Contact a doctor if: Your pain gets worse. Medicine does not help your pain. You have new pain in your arm, hand, or fingers. Get help right away if: Your arm, hand, or fingers: Tingle. Are numb. Are swollen. Are painful. Turn white or blue. Summary Shoulder pain can be caused by many things. These include injury, moving the shoulder in the same away again and again, and joint pain. Watch for changes in  your symptoms. Let your doctor know about them. This condition may be treated with a sling, ice, and pain medicine. Contact your doctor if the pain gets worse or you have new pain. Get help right away if your arm, hand, or fingers tingle or get numb, swollen, or painful. Keep all follow-up visits as told by your doctor. This is important. This information is not intended to replace advice given to you by your health care provider. Make sure you discuss any questions you have with your health care provider. Document Revised: 05/17/2021 Document Reviewed: 05/17/2021 Elsevier Patient Education  2022 [employer].

## 2021-09-10 NOTE — Progress Notes (Signed)
BP 112/74    Pulse 76    Temp 99 F (37.2 C) (Oral)    Wt 253 lb 9.6 oz (115 kg)    SpO2 97%    BMI 45.64 kg/m    Subjective:    Patient ID: Lance Kim, male    DOB: [birth year], 50 y.o.   MRN: [id number]  HPI: [name] is a 50 y.o. male  Chief Complaint  Patient presents with   Shoulder Pain    Patient states his shoulder has got a little better, but he notices when he lays on it, that he still has some pain.    SHOULDER PAIN Seen on 08/12/21 for left shoulder pain ongoing for awhile.  Imaging done and showed arthritic changes to acromioclavicular and glenohumeral areas.  He was placed on Gabapentin which he reports is helping a lot with pain -- 300 MG at bedtime.  Started with crick in neck left side yesterday. Duration: chronic Involved shoulder: left Mechanism of injury: unknown Location: anterior Onset:gradual Severity: 2/10  Quality:  dull, aching, and throbbing Frequency: intermittent Radiation: no Aggravating factors:  laying on it wrong   Alleviating factors:  Gabapentin and rest  Status: better Treatments attempted:  Gabapentin and heat  Relief with NSAIDs?:  No NSAIDs Taken Weakness: no Numbness: no Decreased grip strength: no Redness: no Swelling: no Bruising: no Fevers: no   Relevant past medical, surgical, family and social history reviewed and updated as indicated. Interim medical history since our last visit reviewed. Allergies and medications reviewed and updated.  Review of Systems  Constitutional: Negative.   Respiratory: Negative.    Cardiovascular: Negative.   Gastrointestinal: Negative.   Musculoskeletal:  Positive for arthralgias (left shoulder pain).  Skin: Negative.   Neurological: Negative.   Psychiatric/Behavioral: Negative.     Per HPI unless specifically indicated above     Objective:    BP 112/74    Pulse 76    Temp 99 F (37.2 C) (Oral)    Wt 253 lb 9.6 oz (115 kg)    SpO2 97%    BMI 45.64 kg/m   Wt Readings  from Last 3 Encounters:  09/10/21 253 lb 9.6 oz (115 kg)  08/12/21 252 lb 6.4 oz (114.5 kg)  08/01/21 245 lb (111.1 kg)    Physical Exam Vitals and nursing note reviewed.  Constitutional:      General: He is awake. He is not in acute distress.    Appearance: He is well-developed. He is not ill-appearing.  HENT:     Head: Normocephalic.     Right Ear: Hearing normal. No drainage.     Left Ear: Hearing normal. No drainage.     Mouth/Throat:     Pharynx: Uvula midline.  Eyes:     General: Lids are normal.        Right eye: No discharge.        Left eye: No discharge.     Conjunctiva/sclera: Conjunctivae normal.     Pupils: Pupils are equal, round, and reactive to light.  Neck:     Vascular: No carotid bruit.  Cardiovascular:     Rate and Rhythm: Normal rate and regular rhythm.     Heart sounds: Normal heart sounds, S1 normal and S2 normal. No murmur heard.   No gallop.  Pulmonary:     Effort: Pulmonary effort is normal. No accessory muscle usage or respiratory distress.     Breath sounds: Normal breath sounds.  Abdominal:  General: Bowel sounds are normal.     Palpations: Abdomen is soft.  Musculoskeletal:     Right shoulder: Normal.     Left shoulder: No swelling, effusion, laceration, tenderness, bony tenderness or crepitus. Normal range of motion. Normal strength.     Cervical back: Normal range of motion and neck supple.     Right lower leg: No edema.     Left lower leg: No edema.  Skin:    General: Skin is warm and dry.     Capillary Refill: Capillary refill takes less than 2 seconds.     Findings: No rash.  Neurological:     Mental Status: He is alert and oriented to person, place, and time.     Deep Tendon Reflexes: Reflexes are normal and symmetric.  Psychiatric:        Attention and Perception: Attention normal.        Mood and Affect: Mood normal.        Speech: Speech normal.        Behavior: Behavior normal. Behavior is cooperative.        Thought  Content: Thought content normal.   Results for orders placed or performed in visit on 05/28/21  Comprehensive metabolic panel  Result Value Ref Range   Glucose 78 65 - 99 mg/dL   BUN 8 6 - 24 mg/dL   Creatinine, Ser 1.07 0.76 - 1.27 mg/dL   eGFR 84 >59 mL/min/1.73   BUN/Creatinine Ratio 7 (L) 9 - 20   Sodium 137 134 - 144 mmol/L   Potassium 4.2 3.5 - 5.2 mmol/L   Chloride 99 96 - 106 mmol/L   CO2 23 20 - 29 mmol/L   Calcium 9.6 8.7 - 10.2 mg/dL   Total Protein 7.4 6.0 - 8.5 g/dL   Albumin 4.5 3.8 - 4.9 g/dL   Globulin, Total 2.9 1.5 - 4.5 g/dL   Albumin/Globulin Ratio 1.6 1.2 - 2.2   Bilirubin Total 0.3 0.0 - 1.2 mg/dL   Alkaline Phosphatase 95 44 - 121 IU/L   AST 25 0 - 40 IU/L   ALT 30 0 - 44 IU/L  HgB A1c  Result Value Ref Range   Hgb A1c MFr Bld 6.7 (H) 4.8 - 5.6 %   Est. average glucose Bld gHb Est-mCnc 146 mg/dL  CBC with Differential/Platelet  Result Value Ref Range   WBC 7.1 3.4 - 10.8 x10E3/uL   RBC 5.27 4.14 - 5.80 x10E6/uL   Hemoglobin 15.0 13.0 - 17.7 g/dL   Hematocrit 44.8 37.5 - 51.0 %   MCV 85 79 - 97 fL   MCH 28.5 26.6 - 33.0 pg   MCHC 33.5 31.5 - 35.7 g/dL   RDW 13.0 11.6 - 15.4 %   Platelets 285 150 - 450 x10E3/uL   Neutrophils 45 Not Estab. %   Lymphs 42 Not Estab. %   Monocytes 9 Not Estab. %   Eos 3 Not Estab. %   Basos 1 Not Estab. %   Neutrophils Absolute 3.2 1.4 - 7.0 x10E3/uL   Lymphocytes Absolute 3.0 0.7 - 3.1 x10E3/uL   Monocytes Absolute 0.7 0.1 - 0.9 x10E3/uL   EOS (ABSOLUTE) 0.2 0.0 - 0.4 x10E3/uL   Basophils Absolute 0.1 0.0 - 0.2 x10E3/uL   Immature Granulocytes 0 Not Estab. %   Immature Grans (Abs) 0.0 0.0 - 0.1 x10E3/uL  Lipid Panel w/o Chol/HDL Ratio  Result Value Ref Range   Cholesterol, Total 137 100 - 199 mg/dL   Triglycerides 112 0 -  149 mg/dL   HDL 39 (L) >39 mg/dL   VLDL Cholesterol Cal 21 5 - 40 mg/dL   LDL Chol Calc (NIH) 77 0 - 99 mg/dL  TSH  Result Value Ref Range   TSH 1.850 0.450 - 4.500 uIU/mL  PSA  Result  Value Ref Range   Prostate Specific Ag, Serum 0.5 0.0 - 4.0 ng/mL  Microalbumin, Urine Waived  Result Value Ref Range   Microalb, Ur Waived 10 0 - 19 mg/L   Creatinine, Urine Waived 50 10 - 300 mg/dL   Microalb/Creat Ratio <30 <30 mg/g      Assessment & Plan:   Problem List Items Addressed This Visit       Other   Chronic left shoulder pain    Chronic and improved at this time with Gabapentin and Tylenol.  Imaging noting arthritis to acromioclavicular and glenohumeral area.  Recommend continue this regimen and heat as needed.  May use Voltaren gel as needed for neck and shoulder pain.  Return to office for worsening or ongoing.          Follow up plan: Return if symptoms worsen or fail to improve.

## 2021-09-10 NOTE — Assessment & Plan Note (Signed)
Chronic and improved at this time with Gabapentin and Tylenol.  Imaging noting arthritis to acromioclavicular and glenohumeral area.  Recommend continue this regimen and heat as needed.  May use Voltaren gel as needed for neck and shoulder pain.  Return to office for worsening or ongoing.

## 2021-11-24 NOTE — Patient Instructions (Signed)

## 2021-11-29 ENCOUNTER — Encounter: Payer: Self-pay | Admitting: Nurse Practitioner

## 2021-11-29 ENCOUNTER — Other Ambulatory Visit: Payer: Self-pay

## 2021-11-29 ENCOUNTER — Ambulatory Visit: Payer: BC Managed Care – PPO | Admitting: Nurse Practitioner

## 2021-11-29 VITALS — BP 113/73 | HR 60 | Temp 98.3°F | Ht 62.5 in | Wt 248.2 lb

## 2021-11-29 DIAGNOSIS — E559 Vitamin D deficiency, unspecified: Secondary | ICD-10-CM

## 2021-11-29 DIAGNOSIS — E1169 Type 2 diabetes mellitus with other specified complication: Secondary | ICD-10-CM | POA: Diagnosis not present

## 2021-11-29 DIAGNOSIS — E538 Deficiency of other specified B group vitamins: Secondary | ICD-10-CM

## 2021-11-29 DIAGNOSIS — J432 Centrilobular emphysema: Secondary | ICD-10-CM | POA: Diagnosis not present

## 2021-11-29 DIAGNOSIS — F17210 Nicotine dependence, cigarettes, uncomplicated: Secondary | ICD-10-CM

## 2021-11-29 DIAGNOSIS — G4733 Obstructive sleep apnea (adult) (pediatric): Secondary | ICD-10-CM

## 2021-11-29 DIAGNOSIS — E785 Hyperlipidemia, unspecified: Secondary | ICD-10-CM

## 2021-11-29 DIAGNOSIS — L409 Psoriasis, unspecified: Secondary | ICD-10-CM

## 2021-11-29 DIAGNOSIS — E669 Obesity, unspecified: Secondary | ICD-10-CM

## 2021-11-29 DIAGNOSIS — F325 Major depressive disorder, single episode, in full remission: Secondary | ICD-10-CM

## 2021-11-29 DIAGNOSIS — I152 Hypertension secondary to endocrine disorders: Secondary | ICD-10-CM | POA: Diagnosis not present

## 2021-11-29 DIAGNOSIS — E1159 Type 2 diabetes mellitus with other circulatory complications: Secondary | ICD-10-CM | POA: Diagnosis not present

## 2021-11-29 NOTE — Assessment & Plan Note (Signed)
Chronic, ongoing.  Continue current medication regimen and adjust as needed. Lipid panel today. 

## 2021-11-29 NOTE — Assessment & Plan Note (Signed)
I have recommended complete cessation of tobacco use. I have discussed various options available for assistance with tobacco cessation including over the counter methods (Nicotine gum, patch and lozenges). We also discussed prescription options (Chantix, Nicotine Inhaler / Nasal Spray). The patient is not interested in pursuing any prescription tobacco cessation options at this time.  

## 2021-11-29 NOTE — Assessment & Plan Note (Signed)
Chronic, stable with A1C last 6.7% remaining off Metformin and maintaining diet + weight & urine ALB 10 last visit -- recheck A1c and urine ALB today, will notify him if changes needed.  Praised him for maintaining weight loss.  Will continue off medication at this time and continue diet focus.  Return in 6 months for annual physical. ?

## 2021-11-29 NOTE — Assessment & Plan Note (Signed)
Followed by dermatology, continue this collaboration and medication as ordered by them. 

## 2021-11-29 NOTE — Assessment & Plan Note (Signed)
Consider sleep study in future, at this time wishes to wait. 

## 2021-11-29 NOTE — Progress Notes (Signed)
? ?BP 113/73   Pulse 60   Temp 98.3 ?F (36.8 ?C) (Oral)   Ht 5' 2.5" (1.588 m)   Wt 248 lb 3.2 oz (112.6 kg)   SpO2 98%   BMI 44.67 kg/m?   ? ?Subjective:  ? ? Patient ID: Lance Kim, male    DOB: 11/21/1970, 51 y.o.   MRN: [id number] ? ?HPI: ?Lance Kim is a 51 y.o. male ? ?Chief Complaint  ?Patient presents with  ? Diabetes  ?  Patient recent Diabetic Eye Exam requested at today's visit.   ? Hyperlipidemia  ? Hypertension  ? Sleep Apnea  ? Emphysema  ? ?DIABETES ?Last visit A1c 6.7% and continues off medication at this time. Had significant weight loss in past and is attempting to maintain, has had some gain recently, but at this time has lost 5 pounds.  Has been working out some.  Never went for sleep study. ?Hypoglycemic episodes:no ?Polydipsia/polyuria: no ?Visual disturbance: no ?Chest pain: no ?Paresthesias: no ?Glucose Monitoring: no ? Accucheck frequency: not checking ? Fasting glucose:  ? Post prandial: ? Evening:  ? Before meals: ?Taking Insulin?: no ? Long acting insulin: ? Short acting insulin: ?Blood Pressure Monitoring: not checking ?Retinal Examination: Up To Date -- last month [facility] ?Foot Exam: Up to Date ?Pneumovax: Up to Date ?Influenza: Not Up to Date ?Aspirin: yes  ? ?HYPERTENSION / HYPERLIPIDEMIA ?Continues on Lisinopril-HCTZ, Amlodipine, and Atorvastatin, ASA.  Is followed by dermatology for psoriasis -- has visit with them next week.  ?Satisfied with current treatment? yes ?Duration of hypertension: chronic ?BP monitoring frequency: not checking ?BP range:  ?BP medication side effects: no ?Duration of hyperlipidemia: chronic ?Cholesterol medication side effects: no ?Cholesterol supplements: none ?Medication compliance: good compliance ?Aspirin: yes ?Recent stressors: no ?Recurrent headaches: no ?Visual changes: no ?Palpitations: no ?Dyspnea: no ?Chest pain: no ?Lower extremity edema: no ?Dizzy/lightheaded: no  ? ?COPD ?Lung screening 08/01/21 noting emphysema and  bilateral gynecomastia.  Continues to smoke daily -- smoking 1/2 PPD, started smoking at age 13. ?COPD status: stable ?Satisfied with current treatment?: yes ?Oxygen use: no ?Dyspnea frequency: none ?Cough frequency: none ?Rescue inhaler frequency:  none ?Limitation of activity: no ?Productive cough: none ?Last Spirometry: today ?Pneumovax: Up to Date ?Influenza: Up To Date ? ?DEPRESSION ?Continues on Prozac 20 MG daily. ?Mood status: stable ?Satisfied with current treatment?: yes ?Symptom severity: mild  ?Duration of current treatment : chronic ?Side effects: no ?Medication compliance: good compliance ?Psychotherapy/counseling: none ?Depressed mood: no ?Anxious mood: no ?Anhedonia: no ?Significant weight loss or gain: no ?Insomnia: none ?Fatigue: no ?Feelings of worthlessness or guilt: no ?Impaired concentration/indecisiveness: no ?Suicidal ideations: no ?Hopelessness: no ?Crying spells: with loss of mother ?Depression screen [facility] 2/9 11/29/2021 09/10/2021 03/22/2021 11/23/2020 05/25/2020  ?Decreased Interest 1 2 0 0 0  ?Down, Depressed, Hopeless 1 2 0 1 1  ?PHQ - 2 Score 2 4 0 1 1  ?Altered sleeping 1 1 0 1 1  ?Tired, decreased energy 1 1 0 1 0  ?Change in appetite 1 1 0 0 1  ?Feeling bad or failure about yourself  0 0 0 0 0  ?Trouble concentrating 1 1 0 0 0  ?Moving slowly or fidgety/restless 0 1 0 0 0  ?Suicidal thoughts 0 0 0 0 0  ?PHQ-9 Score 6 9 0 3 3  ?Difficult doing work/chores - - Not difficult at all Not difficult at all Not difficult at all  ?Some recent data might be hidden  ? ?GAD  7 : Generalized Anxiety Score 11/29/2021 09/10/2021 07/14/2018  ?Nervous, Anxious, on Edge 1 1 0  ?Control/stop worrying 1 1 0  ?Worry too much - different things 1 1 0  ?Trouble relaxing 0 1 0  ?Restless 0 0 0  ?Easily annoyed or irritable 0 1 0  ?Afraid - awful might happen 1 1 0  ?Total GAD 7 Score 4 6 0  ?Anxiety Difficulty Not difficult at all - -  ? ? ?Relevant past medical, surgical, family and social history reviewed and  updated as indicated. Interim medical history since our last visit reviewed. ?Allergies and medications reviewed and updated. ? ?Review of Systems  ?Constitutional:  Negative for activity change, diaphoresis, fatigue and fever.  ?Respiratory:  Negative for cough, chest tightness, shortness of breath and wheezing.   ?Cardiovascular:  Negative for chest pain, palpitations and leg swelling.  ?Gastrointestinal: Negative.   ?Endocrine: Negative.   ?Neurological: Negative.   ?Psychiatric/Behavioral: Negative.    ? ?Per HPI unless specifically indicated above ?   ?Objective:  ?  ?BP 113/73   Pulse 60   Temp 98.3 ?F (36.8 ?C) (Oral)   Ht 5' 2.5" (1.588 m)   Wt 248 lb 3.2 oz (112.6 kg)   SpO2 98%   BMI 44.67 kg/m?   ?Wt Readings from Last 3 Encounters:  ?11/29/21 248 lb 3.2 oz (112.6 kg)  ?09/10/21 253 lb 9.6 oz (115 kg)  ?08/12/21 252 lb 6.4 oz (114.5 kg)  ?  ?Physical Exam ?Vitals and nursing note reviewed.  ?Constitutional:   ?   General: He is awake. He is not in acute distress. ?   Appearance: He is well-developed and well-groomed. He is obese. He is not ill-appearing or toxic-appearing.  ?HENT:  ?   Head: Normocephalic.  ?   Right Ear: Hearing, ear canal and external ear normal. No drainage.  ?   Left Ear: Hearing, ear canal and external ear normal. No drainage.  ?   Mouth/Throat:  ?   Pharynx: Uvula midline.  ?Eyes:  ?   General: Lids are normal.     ?   Right eye: No discharge.     ?   Left eye: No discharge.  ?   Conjunctiva/sclera: Conjunctivae normal.  ?   Pupils: Pupils are equal, round, and reactive to light.  ?Neck:  ?   Vascular: No carotid bruit.  ?Cardiovascular:  ?   Rate and Rhythm: Normal rate and regular rhythm.  ?   Heart sounds: Normal heart sounds, S1 normal and S2 normal. No murmur heard. ?  No gallop.  ?   Comments: No edema to legs. ?Pulmonary:  ?   Effort: Pulmonary effort is normal. No accessory muscle usage or respiratory distress.  ?   Breath sounds: Normal breath sounds.  ?Abdominal:  ?    General: Bowel sounds are normal.  ?   Palpations: Abdomen is soft.  ?Musculoskeletal:     ?   General: Normal range of motion.  ?   Cervical back: Normal range of motion and neck supple.  ?   Right lower leg: No edema.  ?   Left lower leg: No edema.  ?Skin: ?   General: Skin is warm and dry.  ?   Capillary Refill: Capillary refill takes less than 2 seconds.  ?Neurological:  ?   Mental Status: He is alert and oriented to person, place, and time.  ?   Deep Tendon Reflexes: Reflexes are normal and symmetric.  ?Psychiatric:     ?  Attention and Perception: Attention normal.     ?   Mood and Affect: Mood normal.     ?   Speech: Speech normal.     ?   Behavior: Behavior normal. Behavior is cooperative.     ?   Thought Content: Thought content normal.  ? ?Results for orders placed or performed in visit on 05/28/21  ?Comprehensive metabolic panel  ?Result Value Ref Range  ? Glucose 78 65 - 99 mg/dL  ? BUN 8 6 - 24 mg/dL  ? Creatinine, Ser 1.07 0.76 - 1.27 mg/dL  ? eGFR 84 >59 mL/min/1.73  ? BUN/Creatinine Ratio 7 (L) 9 - 20  ? Sodium 137 134 - 144 mmol/L  ? Potassium 4.2 3.5 - 5.2 mmol/L  ? Chloride 99 96 - 106 mmol/L  ? CO2 23 20 - 29 mmol/L  ? Calcium 9.6 8.7 - 10.2 mg/dL  ? Total Protein 7.4 6.0 - 8.5 g/dL  ? Albumin 4.5 3.8 - 4.9 g/dL  ? Globulin, Total 2.9 1.5 - 4.5 g/dL  ? Albumin/Globulin Ratio 1.6 1.2 - 2.2  ? Bilirubin Total 0.3 0.0 - 1.2 mg/dL  ? Alkaline Phosphatase 95 44 - 121 IU/L  ? AST 25 0 - 40 IU/L  ? ALT 30 0 - 44 IU/L  ?HgB A1c  ?Result Value Ref Range  ? Hgb A1c MFr Bld 6.7 (H) 4.8 - 5.6 %  ? Est. average glucose Bld gHb Est-mCnc 146 mg/dL  ?CBC with Differential/Platelet  ?Result Value Ref Range  ? WBC 7.1 3.4 - 10.8 x10E3/uL  ? RBC 5.27 4.14 - 5.80 x10E6/uL  ? Hemoglobin 15.0 13.0 - 17.7 g/dL  ? Hematocrit 44.8 37.5 - 51.0 %  ? MCV 85 79 - 97 fL  ? MCH 28.5 26.6 - 33.0 pg  ? MCHC 33.5 31.5 - 35.7 g/dL  ? RDW 13.0 11.6 - 15.4 %  ? Platelets 285 150 - 450 x10E3/uL  ? Neutrophils 45 Not Estab. %  ?  Lymphs 42 Not Estab. %  ? Monocytes 9 Not Estab. %  ? Eos 3 Not Estab. %  ? Basos 1 Not Estab. %  ? Neutrophils Absolute 3.2 1.4 - 7.0 x10E3/uL  ? Lymphocytes Absolute 3.0 0.7 - 3.1 x10E3/uL  ? Monocytes Absolute 0.7 0.1

## 2021-11-29 NOTE — Assessment & Plan Note (Signed)
Chronic, stable with A1C today 6.4% & urine ALB 10 remaining off Metformin and maintaining diet + weight and BP remains under goal.  Praised him for ongoing maintenance of weight.  Will continue current BP medications and adjust as needed, Lisinopril offering kidney protection.  CMP and urine ALB today.  Return in 6 months. ?

## 2021-11-29 NOTE — Assessment & Plan Note (Signed)
Noted on CT imaging.  FEV1 99% and FEV1/FVC 95% on 11/23/20.  No current inhalers.  Recommend complete cessation of smoking and initiate inhalers as needed. ?

## 2021-11-29 NOTE — Assessment & Plan Note (Signed)
Chronic, stable.  Denies SI/HI.  Continue current medication regimen and adjust as needed. 

## 2021-11-30 ENCOUNTER — Other Ambulatory Visit: Payer: Self-pay | Admitting: Nurse Practitioner

## 2021-11-30 ENCOUNTER — Encounter: Payer: Self-pay | Admitting: Nurse Practitioner

## 2021-11-30 DIAGNOSIS — E559 Vitamin D deficiency, unspecified: Secondary | ICD-10-CM | POA: Insufficient documentation

## 2021-11-30 LAB — COMPREHENSIVE METABOLIC PANEL
ALT: 25 IU/L (ref 0–44)
AST: 20 IU/L (ref 0–40)
Albumin/Globulin Ratio: 1.4 (ref 1.2–2.2)
Albumin: 4 g/dL (ref 3.8–4.9)
Alkaline Phosphatase: 90 IU/L (ref 44–121)
BUN/Creatinine Ratio: 9 (ref 9–20)
BUN: 9 mg/dL (ref 6–24)
Bilirubin Total: 0.3 mg/dL (ref 0.0–1.2)
CO2: 22 mmol/L (ref 20–29)
Calcium: 9.1 mg/dL (ref 8.7–10.2)
Chloride: 99 mmol/L (ref 96–106)
Creatinine, Ser: 1.05 mg/dL (ref 0.76–1.27)
Globulin, Total: 2.9 g/dL (ref 1.5–4.5)
Glucose: 119 mg/dL — ABNORMAL HIGH (ref 70–99)
Potassium: 4.3 mmol/L (ref 3.5–5.2)
Sodium: 137 mmol/L (ref 134–144)
Total Protein: 6.9 g/dL (ref 6.0–8.5)
eGFR: 85 mL/min/{1.73_m2} (ref >59)

## 2021-11-30 LAB — HEMOGLOBIN A1C
Est. average glucose Bld gHb Est-mCnc: 140 mg/dL
Hgb A1c MFr Bld: 6.5 % — ABNORMAL HIGH (ref 4.8–5.6)

## 2021-11-30 LAB — LIPID PANEL W/O CHOL/HDL RATIO
Cholesterol, Total: 116 mg/dL (ref 100–199)
HDL: 34 mg/dL — ABNORMAL LOW (ref >39)
LDL Chol Calc (NIH): 65 mg/dL (ref 0–99)
Triglycerides: 89 mg/dL (ref 0–149)
VLDL Cholesterol Cal: 17 mg/dL (ref 5–40)

## 2021-11-30 LAB — MICROALBUMIN / CREATININE URINE RATIO
Creatinine, Urine: 49 mg/dL
Microalb/Creat Ratio: <6 mg/g creat (ref 0–29)
Microalbumin, Urine: <3 ug/mL

## 2021-11-30 LAB — VITAMIN D 25 HYDROXY (VIT D DEFICIENCY, FRACTURES): Vit D, 25-Hydroxy: 8.9 ng/mL — ABNORMAL LOW (ref 30.0–100.0)

## 2021-11-30 LAB — VITAMIN B12: Vitamin B-12: 419 pg/mL (ref 232–1245)

## 2021-11-30 MED ORDER — CHOLECALCIFEROL 1.25 MG (50000 UT) PO TABS: 1.0000 | ORAL_TABLET | ORAL | 4 refills | Status: DC

## 2021-11-30 NOTE — Progress Notes (Signed)
Contacted via [place] ? ? ?Good morning Alvaro, your labs have returned: ?- A1c remains stable at 6.5%, previous was 6.7% -- GREAT JOB!! ?- Kidney function, creatinine and eGFR, remains normal, as is liver function, AST and ALT.   ?- Cholesterol levels are at goal, continue medication. ?- B12 level stable.  Vitamin D level quite low, I am going to send in weekly Vitamin D for you to take, as this is important for bone and muscle health.  Any questions? ?Keep being amazing!!  Thank you for allowing me to participate in your care.  I appreciate you. ?Kindest regards, ?Claire Dolores ?

## 2021-12-02 ENCOUNTER — Other Ambulatory Visit: Payer: Self-pay

## 2021-12-02 NOTE — Telephone Encounter (Signed)
[place] called, spoke with Wells Guiles, [facility] and gave clarification for Vit D. She will change it to caps instead of tabs since it doesn't come in tabs.  ?

## 2021-12-03 DIAGNOSIS — L738 Other specified follicular disorders: Secondary | ICD-10-CM | POA: Diagnosis not present

## 2021-12-03 DIAGNOSIS — L720 Epidermal cyst: Secondary | ICD-10-CM | POA: Diagnosis not present

## 2021-12-03 DIAGNOSIS — L400 Psoriasis vulgaris: Secondary | ICD-10-CM | POA: Diagnosis not present

## 2021-12-03 DIAGNOSIS — Z79899 Other long term (current) drug therapy: Secondary | ICD-10-CM | POA: Diagnosis not present

## 2022-04-19 ENCOUNTER — Other Ambulatory Visit: Payer: Self-pay | Admitting: Nurse Practitioner

## 2022-04-21 NOTE — Telephone Encounter (Signed)
Requested medication (s) are due for refill today: expired medication  Requested medication (s) are on the active medication list: yes  Last refill:  03/22/21 #90 4 refills  Future visit scheduled: yes in 1 month   Notes to clinic:  expired medication. Do you want to renew Rx?     Requested Prescriptions  Pending Prescriptions Disp Refills   lisinopril-hydrochlorothiazide (ZESTORETIC) 20-12.5 MG tablet [Pharmacy Med Name: LISINOPRIL-HCTZ 20/12.5MG TABLETS] 90 tablet 4    Sig: Take 1 tablet by mouth daily.     Cardiovascular:  ACEI + Diuretic Combos Passed - 04/19/2022  7:27 PM      Passed - Na in normal range and within 180 days    Sodium  Date Value Ref Range Status  11/29/2021 137 134 - 144 mmol/L Final         Passed - K in normal range and within 180 days    Potassium  Date Value Ref Range Status  11/29/2021 4.3 3.5 - 5.2 mmol/L Final         Passed - Cr in normal range and within 180 days    Creatinine, Ser  Date Value Ref Range Status  11/29/2021 1.05 0.76 - 1.27 mg/dL Final         Passed - eGFR is 30 or above and within 180 days    GFR calc Af Amer  Date Value Ref Range Status  05/25/2020 90 >59 mL/min/1.73 Final    Comment:    **[facility] currently reports eGFR in compliance with the current**   recommendations of the [employer]. [facility] will   update reporting as new guidelines are published from the NKF-ASN   Task force.    GFR calc non Af Amer  Date Value Ref Range Status  05/25/2020 78 >59 mL/min/1.73 Final   eGFR  Date Value Ref Range Status  11/29/2021 85 >59 mL/min/1.73 Final         Passed - Patient is not pregnant      Passed - Last BP in normal range    BP Readings from Last 1 Encounters:  11/29/21 113/73         Passed - Valid encounter within last 6 months    Recent Outpatient Visits           4 months ago Type 2 diabetes mellitus with obesity ([place])   [place], [place] T, NP   7 months ago  Chronic left shoulder pain   [place], [place] T, NP   8 months ago Chronic left shoulder pain   [facility] [place], Lauren A, NP   10 months ago Type 2 diabetes mellitus with obesity ([place])   [place], Henrine Screws T, NP   1 year ago Acute pain of left shoulder   [place], Barbaraann Faster, NP       Future Appointments             In 1 month Cannady, Barbaraann Faster, NP [facility]             FLUoxetine (PROZAC) 20 MG capsule [Pharmacy Med Name: FLUOXETINE 20MG CAPSULES] 90 capsule 4    Sig: TAKE 1 CAPSULE(20 MG) BY MOUTH DAILY     Psychiatry:  Antidepressants - SSRI Passed - 04/19/2022  7:27 PM      Passed - Completed PHQ-2 or PHQ-9 in the last 360 days      Passed - Valid encounter within  last 6 months    Recent Outpatient Visits           4 months ago Type 2 diabetes mellitus with obesity ([place])   [place], [place] T, NP   7 months ago Chronic left shoulder pain   [place], [place] T, NP   8 months ago Chronic left shoulder pain   [facility] [place], Lauren A, NP   10 months ago Type 2 diabetes mellitus with obesity ([place])   [place], Henrine Screws T, NP   1 year ago Acute pain of left shoulder   [place], Barbaraann Faster, NP       Future Appointments             In 1 month Cannady, Barbaraann Faster, NP [facility]

## 2022-05-09 ENCOUNTER — Other Ambulatory Visit: Payer: Self-pay | Admitting: Nurse Practitioner

## 2022-05-09 NOTE — Telephone Encounter (Signed)
Requested Prescriptions  Pending Prescriptions Disp Refills  . meloxicam (MOBIC) 7.5 MG tablet [Pharmacy Med Name: MELOXICAM 7.5MG TABLETS] 180 tablet 2    Sig: TAKE 2 TABLETS(15 MG) BY MOUTH DAILY     Analgesics:  COX2 Inhibitors Failed - 05/09/2022  9:10 AM      Failed - Manual Review: Labs are only required if the patient has taken medication for more than 8 weeks.      Passed - HGB in normal range and within 360 days    Hemoglobin  Date Value Ref Range Status  05/28/2021 15.0 13.0 - 17.7 g/dL Final         Passed - Cr in normal range and within 360 days    Creatinine, Ser  Date Value Ref Range Status  11/29/2021 1.05 0.76 - 1.27 mg/dL Final         Passed - HCT in normal range and within 360 days    Hematocrit  Date Value Ref Range Status  05/28/2021 44.8 37.5 - 51.0 % Final         Passed - AST in normal range and within 360 days    AST  Date Value Ref Range Status  11/29/2021 20 0 - 40 IU/L Final         Passed - ALT in normal range and within 360 days    ALT  Date Value Ref Range Status  11/29/2021 25 0 - 44 IU/L Final         Passed - eGFR is 30 or above and within 360 days    GFR calc Af Amer  Date Value Ref Range Status  05/25/2020 90 >59 mL/min/1.73 Final    Comment:    **[facility] currently reports eGFR in compliance with the current**   recommendations of the [employer]. [facility] will   update reporting as new guidelines are published from the NKF-ASN   Task force.    GFR calc non Af Amer  Date Value Ref Range Status  05/25/2020 78 >59 mL/min/1.73 Final   eGFR  Date Value Ref Range Status  11/29/2021 85 >59 mL/min/1.73 Final         Passed - Patient is not pregnant      Passed - Valid encounter within last 12 months    Recent Outpatient Visits          5 months ago Type 2 diabetes mellitus with obesity ([place])   [place], [place] T, NP   8 months ago Chronic left shoulder pain   [place], Jolene T, NP   9 months ago Chronic left shoulder pain   [facility] [place], Lauren A, NP   11 months ago Type 2 diabetes mellitus with obesity ([place])   [place], Jolene T, NP   1 year ago Acute pain of left shoulder   [place], Barbaraann Faster, NP      Future Appointments            In 1 month Cannady, Barbaraann Faster, NP [facility]

## 2022-05-22 ENCOUNTER — Ambulatory Visit: Payer: Self-pay

## 2022-05-22 ENCOUNTER — Telehealth: Payer: BC Managed Care – PPO | Admitting: Physician Assistant

## 2022-05-22 DIAGNOSIS — K5900 Constipation, unspecified: Secondary | ICD-10-CM | POA: Diagnosis not present

## 2022-05-22 NOTE — Patient Instructions (Signed)
Lance Kim, thank you for joining [name], PA-C for today's virtual visit.  While this provider is not your primary care provider (PCP), if your PCP is located in our provider database this encounter information will be shared with them immediately following your visit.  Consent: (Patient) Lance Kim provided verbal consent for this virtual visit at the beginning of the encounter.  Current Medications:  Current Outpatient Medications:    FLUoxetine (PROZAC) 20 MG capsule, TAKE 1 CAPSULE(20 MG) BY MOUTH DAILY, Disp: 90 capsule, Rfl: 4   lisinopril-hydrochlorothiazide (ZESTORETIC) 20-12.5 MG tablet, TAKE 1 TABLET BY MOUTH DAILY, Disp: 90 tablet, Rfl: 4   amLODipine (NORVASC) 10 MG tablet, TAKE 1 TABLET(10 MG) BY MOUTH DAILY, Disp: 90 tablet, Rfl: 4   Apremilast 30 MG TABS, Take 1 tablet by mouth 2 (two) times daily., Disp: , Rfl:    aspirin EC 81 MG tablet, Take 81 mg by mouth daily., Disp: , Rfl:    atorvastatin (LIPITOR) 20 MG tablet, TAKE 1 TABLET(20 MG) BY MOUTH DAILY, Disp: 90 tablet, Rfl: 4   cetirizine (ZYRTEC) 10 MG tablet, TAKE 1 TABLET(10 MG) BY MOUTH DAILY, Disp: 90 tablet, Rfl: 4   Cholecalciferol 1.25 MG (50000 UT) TABS, Take 1 tablet by mouth once a week., Disp: 12 tablet, Rfl: 4   CONTOUR NEXT TEST test strip, USE TO TEST SUGAR TWICE DAILY, Disp: 100 strip, Rfl: 1   FIBER ADULT GUMMIES PO, Take 2 tablets by mouth daily. , Disp: , Rfl:    fluticasone (FLONASE) 50 MCG/ACT nasal spray, SHAKE LIQUID AND USE 2 SPRAYS IN EACH NOSTRIL TWICE DAILY, Disp: 16 g, Rfl: 4   HALOG 0.1 % CREA, Apply 1 application topically in the morning and at bedtime., Disp: , Rfl:    meloxicam (MOBIC) 7.5 MG tablet, TAKE 2 TABLETS(15 MG) BY MOUTH DAILY, Disp: 180 tablet, Rfl: 0   Microlet Lancets MISC, USE TO TEST SUGAR TWICE DAILY, Disp: 100 each, Rfl: 6   Multiple Vitamin (MULTIVITAMIN) tablet, Take 2 tablets by mouth daily. , Disp: , Rfl:    ondansetron (ZOFRAN-ODT) 4 MG  disintegrating tablet, DISSOLVE 1 TABLET ON THE TONGUE EVERY 8 HOURS AS NEEDED FOR NAUSEA, Disp: 20 tablet, Rfl: 2   triamcinolone cream (KENALOG) 0.1 %, Apply 1 application topically 2 (two) times daily., Disp: 453.6 g, Rfl: 4   UNABLE TO FIND, Lancets (Contour Choice), Disp: , Rfl:    Medications ordered in this encounter:  No orders of the defined types were placed in this encounter.    *If you need refills on other medications prior to your next appointment, please contact your pharmacy*  Follow-Up: Call back or seek an in-person evaluation if the symptoms worsen or if the condition fails to improve as anticipated.  Other Instructions I encourage you to increase hydration and the amount of fiber in your diet.  Start a daily probiotic (Align, Culturelle, Digestive Advantage, etc.). Take 2 Tbs of Milk of Magnesia in a 4 oz glass of warmed prune juice every 2-3 days to help promote bowel movement. If no results within 24 hours, then repeat above regimen, adding a Dulcolax stool softener to regimen. If this does not promote a bowel movement, please call the office.  If you have been instructed to have an in-person evaluation today at a local Urgent Care facility, please use the link below. It will take you to a list of all of our available [facility], including address, phone number and hours  of operation. Please do not delay care.  [facility]  If you or a family member do not have a primary care provider, use the link below to schedule a visit and establish care. When you choose a [facility] primary care physician or advanced practice provider, you gain a long-term partner in health. Find a Primary Care Provider  Learn more about [facility] in-office and virtual care options: [facility]

## 2022-05-22 NOTE — Progress Notes (Signed)
Virtual Visit Consent   Lance Kim, you are scheduled for a virtual visit with a [facility] provider today. Just as with appointments in the office, your consent must be obtained to participate. Your consent will be active for this visit and any virtual visit you may have with one of our providers in the next 365 days. If you have a MyChart account, a copy of this consent can be sent to you electronically.  As this is a virtual visit, video technology does not allow for your provider to perform a traditional examination. This may limit your provider's ability to fully assess your condition. If your provider identifies any concerns that need to be evaluated in person or the need to arrange testing (such as labs, EKG, etc.), we will make arrangements to do so. Although advances in technology are sophisticated, we cannot ensure that it will always work on either your end or our end. If the connection with a video visit is poor, the visit may have to be switched to a telephone visit. With either a video or telephone visit, we are not always able to ensure that we have a secure connection.  By engaging in this virtual visit, you consent to the provision of healthcare and authorize for your insurance to be billed (if applicable) for the services provided during this visit. Depending on your insurance coverage, you may receive a charge related to this service.  I need to obtain your verbal consent now. Are you willing to proceed with your visit today? Lance Kim has provided verbal consent on 05/22/2022 for a virtual visit (video or telephone). Lance Kim, [place]  Date: 05/22/2022 2:12 PM  Virtual Visit via Video Note   I, Lance Kim, connected with  Lance Kim  ([id number], [birth year]) on 05/22/22 at  2:00 PM EDT by a video-enabled telemedicine application and verified that I am speaking with the correct person using two identifiers.  Location: Patient: Virtual Visit  Location Patient: Home Provider: Virtual Visit Location Provider: Home Office   I discussed the limitations of evaluation and management by telemedicine and the availability of in person appointments. The patient expressed understanding and agreed to proceed.    History of Present Illness: Lance Kim is a 51 y.o. who identifies as a male who was assigned male at birth, and is being seen today for some abdominal cramping and constipation over the past few days.  Notes having to strain pretty significantly to pass a bowel movement.  Notes last bowel movement was yesterday evening.  Denies any melena or hematochezia.  Denies recent change to diet.  Is trying to hydrate well.  Notes he was sick last week and taking a lot of cold medication prior to symptom onset.  Denies heartburn or indigestion.  Denies obstipation or inability to pass gas.  Denies nausea or vomiting.  Continues to take his daily fiber supplement.  Did start a probiotic yesterday  HPI: HPI  Problems:  Patient Active Problem List   Diagnosis Date Noted   Vitamin D deficiency 11/30/2021   Centrilobular emphysema ([place]) 05/18/2020   Cigarette nicotine dependence without complication 09/73/5329   Type 2 diabetes mellitus with obesity ([place]) 02/06/2020   Hypertension associated with type 2 diabetes mellitus ([place]) 07/15/2018   DDD (degenerative disc disease), lumbar 01/12/2018   Chronic pain of right knee 10/01/2017   Obstructive sleep apnea syndrome 04/10/2017   Depression 10/08/2016   Psoriasis 04/07/2016   Allergic rhinitis 02/23/2015  Chronic midline low back pain with right-sided sciatica 02/23/2015   Hyperlipidemia associated with type 2 diabetes mellitus ([place]) 12/28/2014    Allergies:  Allergies  Allergen Reactions   Mevacor [Lovastatin] Other (See Comments)    CRAMPS   Medications:  Current Outpatient Medications:    FLUoxetine (PROZAC) 20 MG capsule, TAKE 1 CAPSULE(20 MG) BY MOUTH DAILY, Disp: 90 capsule, Rfl:  4   lisinopril-hydrochlorothiazide (ZESTORETIC) 20-12.5 MG tablet, TAKE 1 TABLET BY MOUTH DAILY, Disp: 90 tablet, Rfl: 4   amLODipine (NORVASC) 10 MG tablet, TAKE 1 TABLET(10 MG) BY MOUTH DAILY, Disp: 90 tablet, Rfl: 4   Apremilast 30 MG TABS, Take 1 tablet by mouth 2 (two) times daily., Disp: , Rfl:    aspirin EC 81 MG tablet, Take 81 mg by mouth daily., Disp: , Rfl:    atorvastatin (LIPITOR) 20 MG tablet, TAKE 1 TABLET(20 MG) BY MOUTH DAILY, Disp: 90 tablet, Rfl: 4   cetirizine (ZYRTEC) 10 MG tablet, TAKE 1 TABLET(10 MG) BY MOUTH DAILY, Disp: 90 tablet, Rfl: 4   Cholecalciferol 1.25 MG (50000 UT) TABS, Take 1 tablet by mouth once a week., Disp: 12 tablet, Rfl: 4   CONTOUR NEXT TEST test strip, USE TO TEST SUGAR TWICE DAILY, Disp: 100 strip, Rfl: 1   FIBER ADULT GUMMIES PO, Take 2 tablets by mouth daily. , Disp: , Rfl:    fluticasone (FLONASE) 50 MCG/ACT nasal spray, SHAKE LIQUID AND USE 2 SPRAYS IN EACH NOSTRIL TWICE DAILY, Disp: 16 g, Rfl: 4   HALOG 0.1 % CREA, Apply 1 application topically in the morning and at bedtime., Disp: , Rfl:    meloxicam (MOBIC) 7.5 MG tablet, TAKE 2 TABLETS(15 MG) BY MOUTH DAILY, Disp: 180 tablet, Rfl: 0   Microlet Lancets MISC, USE TO TEST SUGAR TWICE DAILY, Disp: 100 each, Rfl: 6   Multiple Vitamin (MULTIVITAMIN) tablet, Take 2 tablets by mouth daily. , Disp: , Rfl:    ondansetron (ZOFRAN-ODT) 4 MG disintegrating tablet, DISSOLVE 1 TABLET ON THE TONGUE EVERY 8 HOURS AS NEEDED FOR NAUSEA, Disp: 20 tablet, Rfl: 2   triamcinolone cream (KENALOG) 0.1 %, Apply 1 application topically 2 (two) times daily., Disp: 453.6 g, Rfl: 4   UNABLE TO FIND, Lancets (Contour Choice), Disp: , Rfl:   Observations/Objective: Patient is well-developed, well-nourished in no acute distress.  Resting comfortably at home.  Head is normocephalic, atraumatic.  No labored breathing. Speech is clear and coherent with logical content.  Patient is alert and oriented at baseline.    Assessment and Plan: 1. Constipation, unspecified constipation type  Suspect the cold medication he was taking contributed to constipation.  Also being sick last week and not hydrating as well, in combination with still taking fiber supplement, likely cause some hardening of stools.  No alarm signs or symptoms present.  The mild abdominal discomfort is alleviated when he is able to have a bowel movement.  We will have him continue probiotic.  Increase fluid intake.  Okay to continue fiber supplement for now.  Bowel regimen reviewed with patient for him to start to help promote normal bowel movements.  Strict in person follow-up and urgent care/ER cautions reviewed  Follow Up Instructions: I discussed the assessment and treatment plan with the patient. The patient was provided an opportunity to ask questions and all were answered. The patient agreed with the plan and demonstrated an understanding of the instructions.  A copy of instructions were sent to the patient via MyChart unless otherwise noted below.  The patient was advised to call back or seek an in-person evaluation if the symptoms worsen or if the condition fails to improve as anticipated.  Time:  I spent 10 minutes with the patient via telehealth technology discussing the above problems/concerns.    Lance Rio, PA-C

## 2022-05-22 NOTE — Telephone Encounter (Signed)
  Chief Complaint: constipation, cramping abd pain Symptoms: occasional cold sweat, mild abdominal pain worsens with cramping prior to have BM Frequency: Sat Pertinent Negatives: Patient denies back pain , diarrhea, fever, urination pain, vomiting, occasional sweating "out of nowhere" Disposition: '[]'$ ED /'[]'$ Urgent Care (no appt availability in office) / '[]'$ Appointment(In office/virtual)/ '[x]'$  [facility]/ '[]'$ Home Care/ '[]'$ Refused Recommended Disposition /'[]'$ [facility]/ '[]'$  Follow-up with PCP Additional Notes: no appt's - signed pt up for att at 1400 with [facility] visit Reason for Disposition  [1] MODERATE pain (e.g., interferes with normal activities) AND [2] pain comes and goes (cramps) AND [3] present > 24 hours  (Exception: Pain with Vomiting or Diarrhea - see that Guideline.)  Answer Assessment - Initial Assessment Questions 1. LOCATION: "Where does it hurt?"      Above navel-  2. RADIATION: "Does the pain shoot anywhere else?" (e.g., chest, back)     no 3. ONSET: "When did the pain begin?" (Minutes, hours or days ago)      Sat 4. SUDDEN: "Gradual or sudden onset?"     gradual 5. PATTERN "Does the pain come and go, or is it constant?"    - If it comes and goes: "How long does it last?" "Do you have pain now?"     (Note: Comes and goes means the pain is intermittent. It goes away completely between bouts.)    - If constant: "Is it getting better, staying the same, or getting worse?"      (Note: Constant means the pain never goes away completely; most serious pain is constant and gets worse.)      Constant- better 6. SEVERITY: "How bad is the pain?"  (e.g., Scale 1-10; mild, moderate, or severe)    - MILD (1-3): Doesn't interfere with normal activities, abdomen soft and not tender to touch.     - MODERATE (4-7): Interferes with normal activities or awakens from sleep, abdomen tender to touch.     - SEVERE (8-10): Excruciating pain, doubled over, unable to do any  normal activities.       4/5 7. RECURRENT SYMPTOM: "Have you ever had this type of stomach pain before?" If Yes, ask: "When was the last time?" and "What happened that time?"      no 8. CAUSE: "What do you think is causing the stomach pain?"     Constipation? 9. RELIEVING/AGGRAVATING FACTORS: "What makes it better or worse?" (e.g., antacids, bending or twisting motion, bowel movement)     Trying to make bathroom makes it better-  10. OTHER SYMPTOMS: "Do you have any other symptoms?" (e.g., back pain, diarrhea, fever, urination pain, vomiting)       Sweating out of nowhere-  Protocols used: Abdominal Pain - Male-A-AH

## 2022-06-05 DIAGNOSIS — L853 Xerosis cutis: Secondary | ICD-10-CM | POA: Diagnosis not present

## 2022-06-05 DIAGNOSIS — L400 Psoriasis vulgaris: Secondary | ICD-10-CM | POA: Diagnosis not present

## 2022-06-05 DIAGNOSIS — L731 Pseudofolliculitis barbae: Secondary | ICD-10-CM | POA: Diagnosis not present

## 2022-06-05 DIAGNOSIS — Z79899 Other long term (current) drug therapy: Secondary | ICD-10-CM | POA: Diagnosis not present

## 2022-06-09 ENCOUNTER — Other Ambulatory Visit: Payer: Self-pay | Admitting: Nurse Practitioner

## 2022-06-09 NOTE — Telephone Encounter (Signed)
Requested Prescriptions  Pending Prescriptions Disp Refills  . amLODipine (NORVASC) 10 MG tablet [Pharmacy Med Name: AMLODIPINE BESYLATE '10MG'$  TABLETS] 90 tablet 0    Sig: TAKE 1 TABLET(10 MG) BY MOUTH DAILY     Cardiovascular: Calcium Channel Blockers 2 Failed - 06/09/2022  3:34 AM      Failed - Valid encounter within last 6 months    Recent Outpatient Visits          6 months ago Type 2 diabetes mellitus with obesity ([place])   [place] Guinda, Jolene T, NP   9 months ago Chronic left shoulder pain   [place] Hickman, [place] T, NP   10 months ago Chronic left shoulder pain   [facility] McElwee, Lauren A, NP   1 year ago Type 2 diabetes mellitus with obesity ([place])   [place], [place] T, NP   1 year ago Acute pain of left shoulder   [place], Barbaraann Faster, NP      Future Appointments            In 4 days Cannady, [place] T, NP [employer]           Passed - Last BP in normal range    BP Readings from Last 1 Encounters:  11/29/21 113/73         Passed - Last Heart Rate in normal range    Pulse Readings from Last 1 Encounters:  11/29/21 60

## 2022-06-13 ENCOUNTER — Encounter: Payer: BC Managed Care – PPO | Admitting: Nurse Practitioner

## 2022-06-13 DIAGNOSIS — N400 Enlarged prostate without lower urinary tract symptoms: Secondary | ICD-10-CM

## 2022-06-13 DIAGNOSIS — J432 Centrilobular emphysema: Secondary | ICD-10-CM

## 2022-06-13 DIAGNOSIS — Z0000 Encounter for general adult medical examination without abnormal findings: Secondary | ICD-10-CM

## 2022-06-13 DIAGNOSIS — G4733 Obstructive sleep apnea (adult) (pediatric): Secondary | ICD-10-CM

## 2022-06-13 DIAGNOSIS — E559 Vitamin D deficiency, unspecified: Secondary | ICD-10-CM

## 2022-06-13 DIAGNOSIS — F17210 Nicotine dependence, cigarettes, uncomplicated: Secondary | ICD-10-CM

## 2022-06-13 DIAGNOSIS — F325 Major depressive disorder, single episode, in full remission: Secondary | ICD-10-CM

## 2022-06-13 DIAGNOSIS — E1169 Type 2 diabetes mellitus with other specified complication: Secondary | ICD-10-CM

## 2022-06-13 DIAGNOSIS — L409 Psoriasis, unspecified: Secondary | ICD-10-CM

## 2022-06-13 DIAGNOSIS — E1159 Type 2 diabetes mellitus with other circulatory complications: Secondary | ICD-10-CM

## 2022-06-15 ENCOUNTER — Other Ambulatory Visit: Payer: Self-pay | Admitting: Nurse Practitioner

## 2022-06-16 ENCOUNTER — Encounter: Payer: Self-pay | Admitting: Physician Assistant

## 2022-06-16 ENCOUNTER — Ambulatory Visit: Payer: Self-pay

## 2022-06-16 ENCOUNTER — Telehealth: Payer: BC Managed Care – PPO | Admitting: Physician Assistant

## 2022-06-16 ENCOUNTER — Other Ambulatory Visit: Payer: Self-pay | Admitting: Nurse Practitioner

## 2022-06-16 DIAGNOSIS — J069 Acute upper respiratory infection, unspecified: Secondary | ICD-10-CM | POA: Diagnosis not present

## 2022-06-16 MED ORDER — BENZONATATE 200 MG PO CAPS: 200.0000 mg | ORAL_CAPSULE | Freq: Two times a day (BID) | ORAL | 0 refills | Status: DC | PRN

## 2022-06-16 NOTE — Telephone Encounter (Signed)
Copied from [facility]. Topic: General - Other >> Jun 16, 2022  9:46 AM Everette C wrote: Reason for CRM: Medication Refill - Medication: atorvastatin (LIPITOR) 20 MG tablet [[id number]]   cetirizine (ZYRTEC) 10 MG tablet [[id number]]   Has the patient contacted their pharmacy? Yes.  The patient has been directed to contact their PCP  (Agent: If no, request that the patient contact the pharmacy for the refill. If patient does not wish to contact the pharmacy document the reason why and proceed with request.) (Agent: If yes, when and what did the pharmacy advise?)  Preferred Pharmacy (with phone number or street name): [id number] [name], [place] [address] Phone: [phone] Fax: [phone] Hours: Not open 24 hours   Has the patient been seen for an appointment in the last year OR does the patient have an upcoming appointment? Yes.    Agent: Please be advised that RX refills may take up to 3 business days. We ask that you follow-up with your pharmacy.

## 2022-06-16 NOTE — Telephone Encounter (Signed)
  Chief Complaint: cold symptoms  Symptoms: cough, congestion, headache, chills and inner ear discomfort, decreased appetite Frequency: 06/13/22 Pertinent Negatives: Patient denies SOB or fever Disposition: '[]'$ ED /'[]'$ Urgent Care (no appt availability in office) / '[x]'$ Appointment(In office/virtual)/ '[]'$  [facility]/ '[]'$ Home Care/ '[]'$ Refused Recommended Disposition /'[]'$ [facility]/ '[]'$  Follow-up with PCP Additional Notes: pt has been taking coricidin HBP for cold and flu and hasn't noticed a lot of improvement. Pt states he doesn't have any home COVID tests. Scheduled pt for VV today at 1440 with Erin, PA. Care advice given and pt verbalized understanding. No other questions/concerns noted.   Summary: cold and flu like symptoms / rx req   The patient shares that they have been experiencing cold and flu like symptoms since Friday 06/13/22   The patient shares that they're currently experiencing cough, congestion, headache, chills and inner ear discomfort   The patient would like to speak with a member of clinical staff to discuss their symptoms further when possible   Please contact further when available      Reason for Disposition  Earache  Answer Assessment - Initial Assessment Questions 1. ONSET: "When did the nasal discharge start?"      06/13/22 3. COUGH: "Do you have a cough?" If Yes, ask: "Describe the color of your sputum" (clear, white, yellow, green)     yes 4. RESPIRATORY DISTRESS: "Describe your breathing."      no 5. FEVER: "Do you have a fever?" If Yes, ask: "What is your temperature, how was it measured, and when did it start?"     no 6. SEVERITY: "Overall, how bad are you feeling right now?" (e.g., doesn't interfere with normal activities, staying home from school/work, staying in bed)      Interfere with normal activities  7. OTHER SYMPTOMS: "Do you have any other symptoms?" (e.g., sore throat, earache, wheezing, vomiting)     cough, congestion, headache,  chills and L inner ear, decreased appetite  Protocols used: Common Cold-A-AH

## 2022-06-16 NOTE — Telephone Encounter (Signed)
Just filled, has appt in two weeks. Requested Prescriptions  Pending Prescriptions Disp Refills  . amLODipine (NORVASC) 10 MG tablet [Pharmacy Med Name: AMLODIPINE BESYLATE '10MG'$  TABLETS] 90 tablet 0    Sig: TAKE 1 TABLET(10 MG) BY MOUTH DAILY     Cardiovascular: Calcium Channel Blockers 2 Failed - 06/16/2022  8:04 AM      Failed - Valid encounter within last 6 months    Recent Outpatient Visits          Today Viral upper respiratory tract infection   [facility] Mecum, Erin E, PA-C   6 months ago Type 2 diabetes mellitus with obesity ([place])   [place], [place] T, NP   9 months ago Chronic left shoulder pain   [place] Fulda, [place] T, NP   10 months ago Chronic left shoulder pain   [facility] McElwee, Lauren A, NP   1 year ago Type 2 diabetes mellitus with obesity ([place])   [place], [place] T, NP      Future Appointments            In 2 weeks Cannady, Barbaraann Faster, NP [employer] [facility]           Passed - Last BP in normal range    BP Readings from Last 1 Encounters:  11/29/21 113/73         Passed - Last Heart Rate in normal range    Pulse Readings from Last 1 Encounters:  11/29/21 60

## 2022-06-16 NOTE — Progress Notes (Signed)
Virtual Visit via Video Note  I connected with Lance Kim on 06/16/22 at  2:40 PM EDT by a video enabled telemedicine application and verified that I am speaking with the correct person using two identifiers.  Today's Provider: Talitha Givens, MHS, PA-C Introduced myself to the patient as a PA-C and provided education on APPs in clinical practice.     Location: Patient: at home, [place], [place]  Provider: [place], [place]    I discussed the limitations of evaluation and management by telemedicine and the availability of in person appointments. The patient expressed understanding and agreed to proceed.   Chief Complaint  Patient presents with   Head congestion    Body aches, cough, headache, and no appetite. All started Friday. Patient did not test at home for covid. Has been taking HBP cold and flu does not really help.     History of Present Illness:    Onset: sudden  Duration: since Thurs Associated Symptoms: headaches, runny nose, sneezing, chills, sweats, decreased appetite, body aches, intermittently productive cough Interventions: has been taking OTC Coricidin since Friday Alleviating: Not much Aggravating: nothing  Reports symptoms seem to be getting worse  He has not tested himself for COVID at home  He denies recent sick contacts      Review of Systems  Constitutional:  Positive for chills, diaphoresis and malaise/fatigue.  HENT:  Positive for congestion. Negative for ear pain, hearing loss and sore throat.   Respiratory:  Positive for cough and sputum production. Negative for shortness of breath and wheezing.   Gastrointestinal:  Negative for constipation, diarrhea, nausea and vomiting.  Musculoskeletal:  Positive for myalgias.  Neurological:  Positive for headaches. Negative for dizziness.      Observations/Objective:  Due to the nature of the virtual visit, physical exam and observations are limited. Able to obtain the  following observations:   Alert, oriented, Appears comfortable, in no acute distress.  No scleral injection, no appreciated hoarseness, tachypnea, wheeze or strider. Able to maintain conversation without visible strain.   cough appreciated during visit.      Assessment and Plan:    Problem List Items Addressed This Visit   None Visit Diagnoses     Viral upper respiratory tract infection    -  Primary Acute,new concern Visit with patient indicates symptoms comprised of coughing, headaches, runny nose, chills, decreased appetite  since Thurs congruent with acute URI that is likely viral in nature  Patient is outside therapeutic window for antivirals- no flu or COVID testing performed today.  Due to nature and duration of symptoms recommended treatment regimen is symptomatic relief and follow up if needed Discussed with patient the various viral and bacterial etiologies of current illness and appropriate course of treatment Discussed OTC medication options for multisymptom relief such as Dayquil/Nyquil, Theraflu, AlkaSeltzer, etc. Recommend adding Mucinex to regimen to assist with congestion and mucus Will send in script for Tessalon pearls to assist with cough Discussed return precautions if symptoms are not improving or worsen over next 5-7 days.        Follow Up Instructions:    I discussed the assessment and treatment plan with the patient. The patient was provided an opportunity to ask questions and all were answered. The patient agreed with the plan and demonstrated an understanding of the instructions.   The patient was advised to call back or seek an in-person evaluation if the symptoms worsen or if the condition fails to improve as  anticipated.  I provided 13 minutes of non-face-to-face time during this encounter.   No follow-ups on file.   I, Laetitia Schnepf E Shantana Christon, PA-C, have reviewed all documentation for this visit. The documentation on 06/16/22 for the exam, diagnosis,  procedures, and orders are all accurate and complete.   Talitha Givens, MHS, PA-C [facility]

## 2022-06-16 NOTE — Telephone Encounter (Signed)
Requested Prescriptions  Pending Prescriptions Disp Refills  . atorvastatin (LIPITOR) 20 MG tablet [Pharmacy Med Name: ATORVASTATIN '20MG'$  TABLETS] 90 tablet 1    Sig: TAKE 1 TABLET(20 MG) BY MOUTH DAILY     Cardiovascular:  Antilipid - Statins Failed - 06/15/2022  4:33 PM      Failed - Lipid Panel in normal range within the last 12 months    Cholesterol, Total  Date Value Ref Range Status  11/29/2021 116 100 - 199 mg/dL Final   LDL Chol Calc (NIH)  Date Value Ref Range Status  11/29/2021 65 0 - 99 mg/dL Final   HDL  Date Value Ref Range Status  11/29/2021 34 (L) >39 mg/dL Final   Triglycerides  Date Value Ref Range Status  11/29/2021 89 0 - 149 mg/dL Final         Passed - Patient is not pregnant      Passed - Valid encounter within last 12 months    Recent Outpatient Visits          Today Viral upper respiratory tract infection   [facility] Mecum, Erin E, PA-C   6 months ago Type 2 diabetes mellitus with obesity ([place])   [place], Jolene T, NP   9 months ago Chronic left shoulder pain   [facility] [place], [place] T, NP   10 months ago Chronic left shoulder pain   [facility] McElwee, Lauren A, NP   1 year ago Type 2 diabetes mellitus with obesity ([place])   [place], Barbaraann Faster, NP      Future Appointments            In 2 weeks Cannady, Barbaraann Faster, NP [facility]           . cetirizine (ZYRTEC) 10 MG tablet [Pharmacy Med Name: CETIRIZINE '10MG'$  TABLETS] 90 tablet 1    Sig: TAKE 1 TABLET(10 MG) BY MOUTH DAILY     Ear, Nose, and Throat:  Antihistamines 2 Passed - 06/15/2022  4:33 PM      Passed - Cr in normal range and within 360 days    Creatinine, Ser  Date Value Ref Range Status  11/29/2021 1.05 0.76 - 1.27 mg/dL Final         Passed - Valid encounter within last 12 months    Recent Outpatient Visits          Today Viral upper respiratory tract infection    [facility] Mecum, Erin E, PA-C   6 months ago Type 2 diabetes mellitus with obesity ([place])   [place], Jolene T, NP   9 months ago Chronic left shoulder pain   [place], Jolene T, NP   10 months ago Chronic left shoulder pain   [facility] McElwee, Lauren A, NP   1 year ago Type 2 diabetes mellitus with obesity ([place])   [place], Barbaraann Faster, NP      Future Appointments            In 2 weeks Cannady, Barbaraann Faster, NP [employer] [facility]

## 2022-06-16 NOTE — Patient Instructions (Addendum)
  Based on your described symptoms and the duration of symptoms it is likely that you have a viral upper respiratory infection (often called a "cold")  Symptoms can last for 3-10 days with lingering cough and intermittent symptoms lasting weeks after that.  The goal of treatment at this time is to reduce your symptoms and discomfort   I have sent in Tessalon pearls for you to take twice per day to help with your cough I would recommend adding Mucinex and Tylenol to your daily regimen to help with your symptoms    You can use over the counter medications such as Dayquil/Nyquil, AlkaSeltzer formulations, etc to provide further relief of symptoms according to the manufacturer's instructions  If preferred you can use Coricidin to manage your symptoms rather than those medications mentioned above.   If your symptoms do not improve or become worse in the next 5-7 days please make an apt at the office so we can see you  Go to the ER if you begin to have more serious symptoms such as shortness of breath, trouble breathing, loss of consciousness, swelling around the eyes, high fever, severe lasting headaches, vision changes or neck pain/stiffness.

## 2022-06-17 NOTE — Telephone Encounter (Signed)
Requested Prescriptions  Pending Prescriptions Disp Refills  . atorvastatin (LIPITOR) 20 MG tablet 90 tablet 4     Cardiovascular:  Antilipid - Statins Failed - 06/16/2022 11:25 AM      Failed - Lipid Panel in normal range within the last 12 months    Cholesterol, Total  Date Value Ref Range Status  11/29/2021 116 100 - 199 mg/dL Final   LDL Chol Calc (NIH)  Date Value Ref Range Status  11/29/2021 65 0 - 99 mg/dL Final   HDL  Date Value Ref Range Status  11/29/2021 34 (L) >39 mg/dL Final   Triglycerides  Date Value Ref Range Status  11/29/2021 89 0 - 149 mg/dL Final         Passed - Patient is not pregnant      Passed - Valid encounter within last 12 months    Recent Outpatient Visits          Yesterday Viral upper respiratory tract infection   [facility] Mecum, Erin E, PA-C   6 months ago Type 2 diabetes mellitus with obesity ([place])   [place], Jolene T, NP   9 months ago Chronic left shoulder pain   [facility], Jolene T, NP   10 months ago Chronic left shoulder pain   [facility] McElwee, Lauren A, NP   1 year ago Type 2 diabetes mellitus with obesity ([place])   [place], Barbaraann Faster, NP      Future Appointments            In 1 week Cannady, Barbaraann Faster, NP [facility]           . cetirizine (ZYRTEC) 10 MG tablet 90 tablet 4    Sig: TAKE 1 TABLET(10 MG) BY MOUTH DAILY     Ear, Nose, and Throat:  Antihistamines 2 Passed - 06/16/2022 11:25 AM      Passed - Cr in normal range and within 360 days    Creatinine, Ser  Date Value Ref Range Status  11/29/2021 1.05 0.76 - 1.27 mg/dL Final         Passed - Valid encounter within last 12 months    Recent Outpatient Visits          Yesterday Viral upper respiratory tract infection   [facility] Mecum, Erin E, PA-C   6 months ago Type 2 diabetes mellitus with obesity ([place])   [place], Jolene T, NP   9 months ago Chronic left shoulder pain   [facility] [place], Jolene T, NP   10 months ago Chronic left shoulder pain   [facility] McElwee, Lauren A, NP   1 year ago Type 2 diabetes mellitus with obesity ([place])   [place], Barbaraann Faster, NP      Future Appointments            In 1 week Cannady, Barbaraann Faster, NP [facility]

## 2022-06-28 NOTE — Patient Instructions (Signed)
Diabetes Mellitus Basics  Diabetes mellitus, or diabetes, is a long-term (chronic) disease. It occurs when the body does not properly use sugar (glucose) that is released from food after you eat. Diabetes mellitus may be caused by one or both of these problems: Your pancreas does not make enough of a hormone called insulin. Your body does not react in a normal way to the insulin that it makes. Insulin lets glucose enter cells in your body. This gives you energy. If you have diabetes, glucose cannot get into cells. This causes high blood glucose (hyperglycemia). How to treat and manage diabetes You may need to take insulin or other diabetes medicines daily to keep your glucose in balance. If you are prescribed insulin, you will learn how to give yourself insulin by injection. You may need to adjust the amount of insulin you take based on the foods that you eat. You will need to check your blood glucose levels using a glucose monitor as told by your health care provider. The readings can help determine if you have low or high blood glucose. Generally, you should have these blood glucose levels: Before meals (preprandial): 80-130 mg/dL (4.4-7.2 mmol/L). After meals (postprandial): below 180 mg/dL (10 mmol/L). Hemoglobin A1c (HbA1c) level: less than 7%. Your health care provider will set treatment goals for you. Keep all follow-up visits. This is important. Follow these instructions at home: Diabetes medicines Take your diabetes medicines every day as told by your health care provider. List your diabetes medicines here: Name of medicine: ______________________________ Amount (dose): _______________ Time (a.m./p.m.): _______________ Notes: ___________________________________ Name of medicine: ______________________________ Amount (dose): _______________ Time (a.m./p.m.): _______________ Notes: ___________________________________ Name of medicine: ______________________________ Amount (dose):  _______________ Time (a.m./p.m.): _______________ Notes: ___________________________________ Insulin If you use insulin, list the types of insulin you use here: Insulin type: ______________________________ Amount (dose): _______________ Time (a.m./p.m.): _______________Notes: ___________________________________ Insulin type: ______________________________ Amount (dose): _______________ Time (a.m./p.m.): _______________ Notes: ___________________________________ Insulin type: ______________________________ Amount (dose): _______________ Time (a.m./p.m.): _______________ Notes: ___________________________________ Insulin type: ______________________________ Amount (dose): _______________ Time (a.m./p.m.): _______________ Notes: ___________________________________ Insulin type: ______________________________ Amount (dose): _______________ Time (a.m./p.m.): _______________ Notes: ___________________________________ Managing blood glucose  Check your blood glucose levels using a glucose monitor as told by your health care provider. Write down the times that you check your glucose levels here: Time: _______________ Notes: ___________________________________ Time: _______________ Notes: ___________________________________ Time: _______________ Notes: ___________________________________ Time: _______________ Notes: ___________________________________ Time: _______________ Notes: ___________________________________ Time: _______________ Notes: ___________________________________  Low blood glucose Low blood glucose (hypoglycemia) is when glucose is at or below 70 mg/dL (3.9 mmol/L). Symptoms may include: Feeling: Hungry. Sweaty and clammy. Irritable or easily upset. Dizzy. Sleepy. Having: A fast heartbeat. A headache. A change in your vision. Numbness around the mouth, lips, or tongue. Having trouble with: Moving (coordination). Sleeping. Treating low blood glucose To treat low blood  glucose, eat or drink something containing sugar right away. If you can think clearly and swallow safely, follow the 15:15 rule: Take 15 grams of a fast-acting carb (carbohydrate), as told by your health care provider. Some fast-acting carbs are: Glucose tablets: take 3-4 tablets. Hard candy: eat 3-5 pieces. Fruit juice: drink 4 oz (120 mL). Regular (not diet) soda: drink 4-6 oz (120-180 mL). Honey or sugar: eat 1 Tbsp (15 mL). Check your blood glucose levels 15 minutes after you take the carb. If your glucose is still at or below 70 mg/dL (3.9 mmol/L), take 15 grams of a carb again. If your glucose does not go above 70 mg/dL (3.9 mmol/L) after   3 tries, get help right away. After your glucose goes back to normal, eat a meal or a snack within 1 hour. Treating very low blood glucose If your glucose is at or below 54 mg/dL (3 mmol/L), you have very low blood glucose (severe hypoglycemia). This is an emergency. Do not wait to see if the symptoms will go away. Get medical help right away. Call your local emergency services (911 in the [place] Do not drive yourself to the hospital. Questions to ask your health care provider Should I talk with a diabetes educator? What equipment will I need to care for myself at home? What diabetes medicines do I need? When should I take them? How often do I need to check my blood glucose levels? What number can I call if I have questions? When is my follow-up visit? Where can I find a support group for people with diabetes? Where to find more information American Diabetes Association: www.diabetes.org Association of Diabetes Care and Education Specialists: www.diabeteseducator.org Contact a health care provider if: Your blood glucose is at or above 240 mg/dL (13.3 mmol/L) for 2 days in a row. You have been sick or have had a fever for 2 days or more, and you are not getting better. You have any of these problems for more than 6 hours: You cannot eat or  drink. You feel nauseous. You vomit. You have diarrhea. Get help right away if: Your blood glucose is lower than 54 mg/dL (3 mmol/L). You get confused. You have trouble thinking clearly. You have trouble breathing. These symptoms may represent a serious problem that is an emergency. Do not wait to see if the symptoms will go away. Get medical help right away. Call your local emergency services (911 in the [place] Do not drive yourself to the hospital. Summary Diabetes mellitus is a chronic disease that occurs when the body does not properly use sugar (glucose) that is released from food after you eat. Take insulin and diabetes medicines as told. Check your blood glucose every day, as often as told. Keep all follow-up visits. This is important. This information is not intended to replace advice given to you by your health care provider. Make sure you discuss any questions you have with your health care provider. Document Revised: 01/03/2020 Document Reviewed: 01/03/2020 Elsevier Patient Education  2023 [employer]  

## 2022-06-30 ENCOUNTER — Ambulatory Visit (INDEPENDENT_AMBULATORY_CARE_PROVIDER_SITE_OTHER): Payer: BC Managed Care – PPO | Admitting: Nurse Practitioner

## 2022-06-30 ENCOUNTER — Encounter: Payer: Self-pay | Admitting: Nurse Practitioner

## 2022-06-30 VITALS — BP 117/76 | HR 76 | Temp 98.2°F | Ht 63.0 in | Wt 258.5 lb

## 2022-06-30 DIAGNOSIS — E559 Vitamin D deficiency, unspecified: Secondary | ICD-10-CM

## 2022-06-30 DIAGNOSIS — Z0000 Encounter for general adult medical examination without abnormal findings: Secondary | ICD-10-CM | POA: Diagnosis not present

## 2022-06-30 DIAGNOSIS — N400 Enlarged prostate without lower urinary tract symptoms: Secondary | ICD-10-CM | POA: Diagnosis not present

## 2022-06-30 DIAGNOSIS — F325 Major depressive disorder, single episode, in full remission: Secondary | ICD-10-CM | POA: Diagnosis not present

## 2022-06-30 DIAGNOSIS — E785 Hyperlipidemia, unspecified: Secondary | ICD-10-CM | POA: Diagnosis not present

## 2022-06-30 DIAGNOSIS — I152 Hypertension secondary to endocrine disorders: Secondary | ICD-10-CM

## 2022-06-30 DIAGNOSIS — E1159 Type 2 diabetes mellitus with other circulatory complications: Secondary | ICD-10-CM | POA: Diagnosis not present

## 2022-06-30 DIAGNOSIS — L409 Psoriasis, unspecified: Secondary | ICD-10-CM

## 2022-06-30 DIAGNOSIS — E669 Obesity, unspecified: Secondary | ICD-10-CM

## 2022-06-30 DIAGNOSIS — J432 Centrilobular emphysema: Secondary | ICD-10-CM

## 2022-06-30 DIAGNOSIS — G4733 Obstructive sleep apnea (adult) (pediatric): Secondary | ICD-10-CM

## 2022-06-30 DIAGNOSIS — E1169 Type 2 diabetes mellitus with other specified complication: Secondary | ICD-10-CM

## 2022-06-30 DIAGNOSIS — Z23 Encounter for immunization: Secondary | ICD-10-CM

## 2022-06-30 DIAGNOSIS — F17210 Nicotine dependence, cigarettes, uncomplicated: Secondary | ICD-10-CM

## 2022-06-30 LAB — BAYER DCA HB A1C WAIVED: HB A1C (BAYER DCA - WAIVED): 6.6 % — ABNORMAL HIGH (ref 4.8–5.6)

## 2022-06-30 NOTE — Assessment & Plan Note (Signed)
Chronic, stable with A1c 6.6% remaining off Metformin and maintaining diet + weight & urine ALB 22 November 2021.  At this time wishes to maintain off medication, this is appropriate and recommend refocus on diet and regular exercise -- his daughter is a huge support with this.  Will continue off medication at this time and continue diet focus.  Return in 6 months.

## 2022-06-30 NOTE — Assessment & Plan Note (Signed)
I have recommended complete cessation of tobacco use. I have discussed various options available for assistance with tobacco cessation including over the counter methods (Nicotine gum, patch and lozenges). We also discussed prescription options (Chantix, Nicotine Inhaler / Nasal Spray). The patient is not interested in pursuing any prescription tobacco cessation options at this time.  

## 2022-06-30 NOTE — Assessment & Plan Note (Signed)
Chronic, ongoing.  Continue current medication regimen and adjust as needed. Lipid panel today. 

## 2022-06-30 NOTE — Assessment & Plan Note (Signed)
Followed by dermatology, continue this collaboration and medication as ordered by them.

## 2022-06-30 NOTE — Assessment & Plan Note (Signed)
Chronic, stable.  Denies SI/HI.  Continue current medication regimen and adjust as needed. 

## 2022-06-30 NOTE — Assessment & Plan Note (Signed)
Noted on labs 11/29/21 -- taking weekly supplement.  Recheck level today and can change to daily supplement if levels improved = 2000 units daily.

## 2022-06-30 NOTE — Assessment & Plan Note (Signed)
Chronic, stable.  Noted on CT imaging, lung cancer screening.  FEV1 99% and FEV1/FVC 95% on 11/23/20.  No current inhalers.  Recommend complete cessation of smoking and initiate inhalers as needed.  Spirometry next visit.

## 2022-06-30 NOTE — Progress Notes (Signed)
BP 117/76   Pulse 76   Temp 98.2 F (36.8 C) (Oral)   Ht '5\' 3"'$  (1.6 m)   Wt 258 lb 8 oz (117.3 kg)   SpO2 98%   BMI 45.79 kg/m    Subjective:    Patient ID: Lance Kim, male    DOB: 1971/03/13, 51 y.o.   MRN: [id number]  HPI: Lance Kim is a 51 y.o. male presenting on 06/30/2022 for comprehensive medical examination. Current medical complaints include:none   He currently lives with: daughter Interim Problems from his last visit: no  DIABETES Last visit A1c 6.5%.  Remains off medication at this time. Had significant weight loss in past and is attempting to maintain, but at this time has gained some back recently.  He does endorse poor diet, but is getting back on track with this.  Never went for sleep study in past. Hypoglycemic episodes:no Polydipsia/polyuria: no Visual disturbance: no Chest pain: no Paresthesias: no Glucose Monitoring: no             Accucheck frequency: not checking             Fasting glucose:              Post prandial:             Evening:              Before meals: Taking Insulin?: no             Long acting insulin:             Short acting insulin: Blood Pressure Monitoring: not checking Retinal Examination: Up To Date -- [facility] beginning of year Foot Exam: Up to Date Pneumovax: Up to Date Influenza: Not Up to Date -- is getting at work Aspirin: yes    HYPERTENSION / HYPERLIPIDEMIA Continues on Lisinopril-HCTZ, Amlodipine, and Atorvastatin, ASA.  Is followed by dermatology for psoriasis -- has visit with them next week.  Satisfied with current treatment? yes Duration of hypertension: chronic BP monitoring frequency: not checking BP range:  BP medication side effects: no Duration of hyperlipidemia: chronic Cholesterol medication side effects: no Cholesterol supplements: none Medication compliance: good compliance Aspirin: yes Recent stressors: no Recurrent headaches: no Visual changes: no Palpitations: no Dyspnea:  no Chest pain: no Lower extremity edema: no Dizzy/lightheaded: no    COPD Goes for lung screening, last 08/01/21 noting emphysema and bilateral gynecomastia. Continues to smoke daily -- smoking 1/2 or more PPD, started smoking at age 66. COPD status: stable Satisfied with current treatment?: yes Oxygen use: no Dyspnea frequency: none Cough frequency: none Rescue inhaler frequency:  none Limitation of activity: no Productive cough: none Last Spirometry: today Pneumovax: Up to Date Influenza: Not Up To Date -- receiving at work   DEPRESSION Continues on Prozac 20 MG daily. Mood status: stable Satisfied with current treatment?: yes Symptom severity: mild  Duration of current treatment : chronic Side effects: no Medication compliance: good compliance Psychotherapy/counseling: none Depressed mood: no Anxious mood: no Anhedonia: no Significant weight loss or gain: no Insomnia: none Fatigue: no Feelings of worthlessness or guilt: no Impaired concentration/indecisiveness: no Suicidal ideations: no Hopelessness: no Crying spells: no    06/30/2022    4:09 PM 06/16/2022    2:55 PM 11/29/2021    4:06 PM 09/10/2021    4:13 PM 03/22/2021    8:12 AM  Depression screen PHQ 2/9  Decreased Interest 0 0 1 2 0  Down, Depressed, Hopeless 0  0 1 2 0  PHQ - 2 Score 0 0 2 4 0  Altered sleeping 0 0 1 1 0  Tired, decreased energy 0 0 1 1 0  Change in appetite 0 0 1 1 0  Feeling bad or failure about yourself  0 0 0 0 0  Trouble concentrating 0 0 1 1 0  Moving slowly or fidgety/restless 0 0 0 1 0  Suicidal thoughts 0 0 0 0 0  PHQ-9 Score 0 0 6 9 0  Difficult doing work/chores  Not difficult at all   Not difficult at all       06/16/2022    2:56 PM 11/29/2021    4:07 PM 09/10/2021    4:14 PM 07/14/2018    4:47 PM  GAD 7 : Generalized Anxiety Score  Nervous, Anxious, on Edge 0 1 1 0  Control/stop worrying 0 1 1 0  Worry too much - different things 0 1 1 0  Trouble relaxing 0 0 1 0   Restless 0 0 0 0  Easily annoyed or irritable 0 0 1 0  Afraid - awful might happen 0 1 1 0  Total GAD 7 Score 0 4 6 0  Anxiety Difficulty Not difficult at all Not difficult at all     Functional Status Survey: Is the patient deaf or have difficulty hearing?: No Does the patient have difficulty seeing, even when wearing glasses/contacts?: No Does the patient have difficulty concentrating, remembering, or making decisions?: No Does the patient have difficulty walking or climbing stairs?: No Does the patient have difficulty dressing or bathing?: No Does the patient have difficulty doing errands alone such as visiting a doctor's office or shopping?: No  FALL RISK:    06/30/2022    4:09 PM 06/16/2022    2:55 PM 11/29/2021    4:07 PM 08/01/2019    3:55 PM 07/29/2019    1:54 PM  Fall Risk   Falls in the past year? 0 0 0 0 0  Number falls in past yr: 0 0 0 0   Injury with Fall? 0 0 0 0   Risk for fall due to : No Fall Risks No Fall Risks No Fall Risks    Follow up Falls evaluation completed Falls evaluation completed Falls evaluation completed     Past Medical History:  Past Medical History:  Diagnosis Date   Anxiety    Depression    Diabetes mellitus without complication ([place])    Hyperlipidemia    Hypertension    Hypertensive renal disease without failure    Tobacco use disorder     Surgical History:  Past Surgical History:  Procedure Laterality Date   COLONOSCOPY WITH PROPOFOL N/A 09/02/2019   Procedure: COLONOSCOPY WITH PROPOFOL;  Surgeon: [name], MD;  Location: [facility] ENDOSCOPY;  Service: Gastroenterology;  Laterality: N/A;   WISDOM TOOTH EXTRACTION      Medications:  Current Outpatient Medications on File Prior to Visit  Medication Sig   amLODipine (NORVASC) 10 MG tablet TAKE 1 TABLET(10 MG) BY MOUTH DAILY   Apremilast 30 MG TABS Take 1 tablet by mouth 2 (two) times daily.   aspirin EC 81 MG tablet Take 81 mg by mouth daily.   atorvastatin (LIPITOR) 20 MG tablet  TAKE 1 TABLET(20 MG) BY MOUTH DAILY   cetirizine (ZYRTEC) 10 MG tablet TAKE 1 TABLET(10 MG) BY MOUTH DAILY   Cholecalciferol 1.25 MG (50000 UT) TABS Take 1 tablet by mouth once a week.   CONTOUR  NEXT TEST test strip USE TO TEST SUGAR TWICE DAILY   desoximetasone (TOPICORT) 0.25 % cream Apply topically.   FIBER ADULT GUMMIES PO Take 2 tablets by mouth daily.    FLUoxetine (PROZAC) 20 MG capsule TAKE 1 CAPSULE(20 MG) BY MOUTH DAILY   fluticasone (FLONASE) 50 MCG/ACT nasal spray SHAKE LIQUID AND USE 2 SPRAYS IN EACH NOSTRIL TWICE DAILY   lisinopril-hydrochlorothiazide (ZESTORETIC) 20-12.5 MG tablet TAKE 1 TABLET BY MOUTH DAILY   meloxicam (MOBIC) 7.5 MG tablet TAKE 2 TABLETS(15 MG) BY MOUTH DAILY   Microlet Lancets MISC USE TO TEST SUGAR TWICE DAILY   Multiple Vitamin (MULTIVITAMIN) tablet Take 2 tablets by mouth daily.    ondansetron (ZOFRAN-ODT) 4 MG disintegrating tablet DISSOLVE 1 TABLET ON THE TONGUE EVERY 8 HOURS AS NEEDED FOR NAUSEA   triamcinolone cream (KENALOG) 0.1 % Apply 1 application topically 2 (two) times daily.   UNABLE TO FIND Lancets (Contour Choice)   No current facility-administered medications on file prior to visit.    Allergies:  Allergies  Allergen Reactions   Mevacor [Lovastatin] Other (See Comments)    CRAMPS    Social History:  Social History   Socioeconomic History   Marital status: Legally Separated    Spouse name: Not on file   Number of children: Not on file   Years of education: Not on file   Highest education level: Not on file  Occupational History   Not on file  Tobacco Use   Smoking status: Every Day    Packs/day: 0.75    Years: 32.00    Total pack years: 24.00    Types: Cigarettes   Smokeless tobacco: Never   Tobacco comments:    .5ppd now  Vaping Use   Vaping Use: Never used  Substance and Sexual Activity   Alcohol use: Yes    Alcohol/week: 2.0 standard drinks of alcohol    Types: 1 Glasses of wine, 1 Cans of beer per week     Comment: on occasion   Drug use: No   Sexual activity: Yes    Partners: Female    Comment: satidfied with sexual function  Other Topics Concern   Not on file  Social History Narrative   Not on file   Social Determinants of Health   Financial Resource Strain: Low Risk  (05/28/2021)   Overall Financial Resource Strain (CARDIA)    Difficulty of Paying Living Expenses: Not very hard  Food Insecurity: No Food Insecurity (05/28/2021)   Hunger Vital Sign    Worried About Running Out of Food in the Last Year: Never true    Ran Out of Food in the Last Year: Never true  Transportation Needs: No Transportation Needs (05/28/2021)   PRAPARE - Hydrologist (Medical): No    Lack of Transportation (Non-Medical): No  Physical Activity: Inactive (05/28/2021)   Exercise Vital Sign    Days of Exercise per Week: 0 days    Minutes of Exercise per Session: 0 min  Stress: No Stress Concern Present (05/28/2021)   [place]    Feeling of Stress : Not at all  Social Connections: Not on file  Intimate Partner Violence: Not At Risk (05/28/2021)   Humiliation, Afraid, Rape, and Kick questionnaire    Fear of Current or Ex-Partner: No    Emotionally Abused: No    Physically Abused: No    Sexually Abused: No   Social History   Tobacco Use  Smoking Status Every Day   Packs/day: 0.75   Years: 32.00   Total pack years: 24.00   Types: Cigarettes  Smokeless Tobacco Never  Tobacco Comments   .5ppd now   Social History   Substance and Sexual Activity  Alcohol Use Yes   Alcohol/week: 2.0 standard drinks of alcohol   Types: 1 Glasses of wine, 1 Cans of beer per week   Comment: on occasion    Family History:  Family History  Problem Relation Age of Onset   Diabetes Mother    Heart Problems Mother    Emphysema Father    Cancer Son    Cancer Maternal Grandmother    Prostate cancer Neg Hx    Bladder Cancer  Neg Hx    Kidney cancer Neg Hx     Past medical history, surgical history, medications, allergies, family history and social history reviewed with patient today and changes made to appropriate areas of the chart.   ROS  All other ROS negative except what is listed above and in the HPI.      Objective:    BP 117/76   Pulse 76   Temp 98.2 F (36.8 C) (Oral)   Ht '5\' 3"'$  (1.6 m)   Wt 258 lb 8 oz (117.3 kg)   SpO2 98%   BMI 45.79 kg/m   Wt Readings from Last 3 Encounters:  06/30/22 258 lb 8 oz (117.3 kg)  11/29/21 248 lb 3.2 oz (112.6 kg)  09/10/21 253 lb 9.6 oz (115 kg)    Physical Exam Vitals and nursing note reviewed.  Constitutional:      General: He is awake. He is not in acute distress.    Appearance: He is well-developed and well-groomed. He is obese. He is not ill-appearing or toxic-appearing.  HENT:     Head: Normocephalic and atraumatic.     Right Ear: Hearing, tympanic membrane, ear canal and external ear normal. No drainage.     Left Ear: Hearing, tympanic membrane, ear canal and external ear normal. No drainage.     Nose: Nose normal.     Mouth/Throat:     Pharynx: Uvula midline.  Eyes:     General: Lids are normal.        Right eye: No discharge.        Left eye: No discharge.     Extraocular Movements: Extraocular movements intact.     Conjunctiva/sclera: Conjunctivae normal.     Pupils: Pupils are equal, round, and reactive to light.     Visual Fields: Right eye visual fields normal and left eye visual fields normal.  Neck:     Thyroid: No thyromegaly.     Vascular: No carotid bruit or JVD.     Trachea: Trachea normal.  Cardiovascular:     Rate and Rhythm: Normal rate and regular rhythm.     Heart sounds: Normal heart sounds, S1 normal and S2 normal. No murmur heard.    No gallop.  Pulmonary:     Effort: Pulmonary effort is normal. No accessory muscle usage or respiratory distress.     Breath sounds: Normal breath sounds.  Abdominal:     General:  Bowel sounds are normal.     Palpations: Abdomen is soft. There is no hepatomegaly or splenomegaly.     Tenderness: There is no abdominal tenderness.  Musculoskeletal:        General: Normal range of motion.     Cervical back: Normal range of motion and neck supple.  Right lower leg: No edema.     Left lower leg: No edema.  Lymphadenopathy:     Head:     Right side of head: No submental, submandibular, tonsillar, preauricular or posterior auricular adenopathy.     Left side of head: No submental, submandibular, tonsillar, preauricular or posterior auricular adenopathy.     Cervical: No cervical adenopathy.  Skin:    General: Skin is warm and dry.     Capillary Refill: Capillary refill takes less than 2 seconds.     Findings: No rash.  Neurological:     Mental Status: He is alert and oriented to person, place, and time.     Gait: Gait is intact.     Deep Tendon Reflexes: Reflexes are normal and symmetric.     Reflex Scores:      Brachioradialis reflexes are 2+ on the right side and 2+ on the left side.      Patellar reflexes are 2+ on the right side and 2+ on the left side. Psychiatric:        Attention and Perception: Attention normal.        Mood and Affect: Mood normal.        Speech: Speech normal.        Behavior: Behavior normal. Behavior is cooperative.        Thought Content: Thought content normal.        Cognition and Memory: Cognition normal.    Diabetic Foot Exam - Simple   Simple Foot Form Visual Inspection No deformities, no ulcerations, no other skin breakdown bilaterally: Yes Sensation Testing Intact to touch and monofilament testing bilaterally: Yes Pulse Check Posterior Tibialis and Dorsalis pulse intact bilaterally: Yes Comments     Results for orders placed or performed in visit on 06/30/22  Bayer DCA Hb A1c Waived  Result Value Ref Range   HB A1C (BAYER DCA - WAIVED) 6.6 (H) 4.8 - 5.6 %      Assessment & Plan:   Problem List Items Addressed  This Visit       Cardiovascular and Mediastinum   Hypertension associated with type 2 diabetes mellitus (HCC)    Chronic, stable with BP at goal in office.  Recommend he monitor BP at least a few mornings a week at home and document.  DASH diet at home.  Continue current medication regimen and adjust as needed.  Labs today: CMP, CBC, TSH.  Return in 6 months.       Relevant Orders   Bayer DCA Hb A1c Waived (Completed)   CBC with Differential/Platelet   Comprehensive metabolic panel   TSH     Respiratory   Centrilobular emphysema (HCC)    Chronic, stable.  Noted on CT imaging, lung cancer screening.  FEV1 99% and FEV1/FVC 95% on 11/23/20.  No current inhalers.  Recommend complete cessation of smoking and initiate inhalers as needed.  Spirometry next visit.      Relevant Orders   CBC with Differential/Platelet   Obstructive sleep apnea syndrome    Consider sleep study in future, at this time wishes to wait.        Endocrine   Hyperlipidemia associated with type 2 diabetes mellitus (HCC)    Chronic, ongoing.  Continue current medication regimen and adjust as needed.  Lipid panel today.      Relevant Orders   Bayer DCA Hb A1c Waived (Completed)   Comprehensive metabolic panel   Lipid Panel w/o Chol/HDL Ratio   Type 2 diabetes  mellitus with obesity ([place]) - Primary    Chronic, stable with A1c 6.6% remaining off Metformin and maintaining diet + weight & urine ALB 22 November 2021.  At this time wishes to maintain off medication, this is appropriate and recommend refocus on diet and regular exercise -- his daughter is a huge support with this.  Will continue off medication at this time and continue diet focus.  Return in 6 months.      Relevant Orders   Bayer DCA Hb A1c Waived (Completed)     Musculoskeletal and Integument   Psoriasis    Followed by dermatology, continue this collaboration and medication as ordered by them.        Other   Cigarette nicotine dependence without  complication    I have recommended complete cessation of tobacco use. I have discussed various options available for assistance with tobacco cessation including over the counter methods (Nicotine gum, patch and lozenges). We also discussed prescription options (Chantix, Nicotine Inhaler / Nasal Spray). The patient is not interested in pursuing any prescription tobacco cessation options at this time.       Depression    Chronic, stable.  Denies SI/HI.  Continue current medication regimen and adjust as needed.        Vitamin D deficiency    Noted on labs 11/29/21 -- taking weekly supplement.  Recheck level today and can change to daily supplement if levels improved = 2000 units daily.      Relevant Orders   VITAMIN D 25 Hydroxy (Vit-D Deficiency, Fractures)   Other Visit Diagnoses     Benign prostatic hyperplasia without lower urinary tract symptoms       PSA on labs today   Relevant Orders   PSA   Encounter for annual physical exam       Annual physical today with labs and health maintenance reviewed, discussed with patient.        Discussed aspirin prophylaxis for myocardial infarction prevention and decision was it was not indicated  LABORATORY TESTING:  Health maintenance labs ordered today as discussed above.   The natural history of prostate cancer and ongoing controversy regarding screening and potential treatment outcomes of prostate cancer has been discussed with the patient. The meaning of a false positive PSA and a false negative PSA has been discussed. He indicates understanding of the limitations of this screening test and wishes to proceed with screening PSA testing.   IMMUNIZATIONS:   - Tdap: Tetanus vaccination status reviewed: last tetanus booster within 10 years. - Influenza: Not Up To Date -- receiving at work - Pneumovax: Up to date - Prevnar: Not applicable - Zostavax vaccine: Refused  SCREENING: - Colonoscopy: Up to date  Discussed with patient purpose  of the colonoscopy is to detect colon cancer at curable precancerous or early stages   - AAA Screening: Not applicable  -Hearing Test: Not applicable  -Spirometry: Not applicable   PATIENT COUNSELING:    Sexuality: Discussed sexually transmitted diseases, partner selection, use of condoms, avoidance of unintended pregnancy  and contraceptive alternatives.   Advised to avoid cigarette smoking.  I discussed with the patient that most people either abstain from alcohol or drink within safe limits (<=14/week and <=4 drinks/occasion for males, <=7/weeks and <= 3 drinks/occasion for females) and that the risk for alcohol disorders and other health effects rises proportionally with the number of drinks per week and how often a drinker exceeds daily limits.  Discussed cessation/primary prevention of drug use and availability  of treatment for abuse.   Diet: Encouraged to adjust caloric intake to maintain  or achieve ideal body weight, to reduce intake of dietary saturated fat and total fat, to limit sodium intake by avoiding high sodium foods and not adding table salt, and to maintain adequate dietary potassium and calcium preferably from fresh fruits, vegetables, and low-fat dairy products.    Stressed the importance of regular exercise  Injury prevention: Discussed safety belts, safety helmets, smoke detector, smoking near bedding or upholstery.   Dental health: Discussed importance of regular tooth brushing, flossing, and dental visits.   Follow up plan: NEXT PREVENTATIVE PHYSICAL DUE IN 1 YEAR. Return in about 6 months (around 12/30/2022) for T2DM, HTN/HLD, COPD.

## 2022-06-30 NOTE — Assessment & Plan Note (Signed)
Chronic, stable with BP at goal in office.  Recommend he monitor BP at least a few mornings a week at home and document.  DASH diet at home.  Continue current medication regimen and adjust as needed.  Labs today: CMP, CBC, TSH.  Return in 6 months.

## 2022-06-30 NOTE — Assessment & Plan Note (Signed)
Consider sleep study in future, at this time wishes to wait.

## 2022-07-01 LAB — CBC WITH DIFFERENTIAL/PLATELET
Basophils Absolute: 0.1 10*3/uL (ref 0.0–0.2)
Basos: 1 %
EOS (ABSOLUTE): 0.3 10*3/uL (ref 0.0–0.4)
Eos: 3 %
Hematocrit: 43.6 % (ref 37.5–51.0)
Hemoglobin: 14.8 g/dL (ref 13.0–17.7)
Immature Grans (Abs): 0 10*3/uL (ref 0.0–0.1)
Immature Granulocytes: 0 %
Lymphocytes Absolute: 3.8 10*3/uL — ABNORMAL HIGH (ref 0.7–3.1)
Lymphs: 43 %
MCH: 28.6 pg (ref 26.6–33.0)
MCHC: 33.9 g/dL (ref 31.5–35.7)
MCV: 84 fL (ref 79–97)
Monocytes Absolute: 0.8 10*3/uL (ref 0.1–0.9)
Monocytes: 9 %
Neutrophils Absolute: 3.9 10*3/uL (ref 1.4–7.0)
Neutrophils: 44 %
Platelets: 290 10*3/uL (ref 150–450)
RBC: 5.17 x10E6/uL (ref 4.14–5.80)
RDW: 13.3 % (ref 11.6–15.4)
WBC: 8.9 10*3/uL (ref 3.4–10.8)

## 2022-07-01 LAB — COMPREHENSIVE METABOLIC PANEL
ALT: 35 IU/L (ref 0–44)
AST: 26 IU/L (ref 0–40)
Albumin/Globulin Ratio: 1.4 (ref 1.2–2.2)
Albumin: 4.3 g/dL (ref 3.8–4.9)
Alkaline Phosphatase: 91 IU/L (ref 44–121)
BUN/Creatinine Ratio: 9 (ref 9–20)
BUN: 9 mg/dL (ref 6–24)
Bilirubin Total: <0.2 mg/dL (ref 0.0–1.2)
CO2: 20 mmol/L (ref 20–29)
Calcium: 9.5 mg/dL (ref 8.7–10.2)
Chloride: 101 mmol/L (ref 96–106)
Creatinine, Ser: 1.04 mg/dL (ref 0.76–1.27)
Globulin, Total: 3 g/dL (ref 1.5–4.5)
Glucose: 116 mg/dL — ABNORMAL HIGH (ref 70–99)
Potassium: 4.1 mmol/L (ref 3.5–5.2)
Sodium: 139 mmol/L (ref 134–144)
Total Protein: 7.3 g/dL (ref 6.0–8.5)
eGFR: 86 mL/min/{1.73_m2} (ref >59)

## 2022-07-01 LAB — LIPID PANEL W/O CHOL/HDL RATIO
Cholesterol, Total: 152 mg/dL (ref 100–199)
HDL: 43 mg/dL (ref >39)
LDL Chol Calc (NIH): 81 mg/dL (ref 0–99)
Triglycerides: 165 mg/dL — ABNORMAL HIGH (ref 0–149)
VLDL Cholesterol Cal: 28 mg/dL (ref 5–40)

## 2022-07-01 LAB — TSH: TSH: 2.37 u[IU]/mL (ref 0.450–4.500)

## 2022-07-01 LAB — PSA: Prostate Specific Ag, Serum: 0.5 ng/mL (ref 0.0–4.0)

## 2022-07-01 LAB — VITAMIN D 25 HYDROXY (VIT D DEFICIENCY, FRACTURES): Vit D, 25-Hydroxy: 46.7 ng/mL (ref 30.0–100.0)

## 2022-07-01 NOTE — Progress Notes (Signed)
Contacted via MyChart  Good afternoon Kyung Rudd, overall labs have returned and look stable.  I do not recommend any medication changes at this time.  Continue all of current medications.  Any questions? Keep being stellar!!  Thank you for allowing me to participate in your care.  I appreciate you. Kindest regards, Braylynn Lewing

## 2022-07-31 ENCOUNTER — Ambulatory Visit: Payer: Self-pay

## 2022-07-31 NOTE — Telephone Encounter (Signed)
  Chief Complaint: shoulder pain Symptoms: L shoulder pain and numbness that went all way down to wrist  Frequency: yesterday Pertinent Negatives: NA Disposition: '[]'$ ED /'[]'$ Urgent Care (no appt availability in office) / '[x]'$ Appointment(In office/virtual)/ '[]'$  [facility]/ '[]'$ Home Care/ '[]'$ Refused Recommended Disposition /'[]'$ [facility]/ '[]'$  Follow-up with PCP Additional Notes: pt states he has started working out in evening and noticed yesterday his shoulder had numbness that went all way down to his writst. He says numbness used to just go to elbow but now getting worse. Pt had XR last year from [place], NP so advised pt to schedule appt and may need updated imagining. Scheduled appt for 08/01/22 at 1620.   Summary: Pt stated he was working out yesterday and had numbness in his shoulder that went down to his wrist.   Pt stated he was working out yesterday and had numbness in his shoulder that went down to his wrist. Pt requests that a nurse return his call to discuss. Cb# [phone]         Reason for Disposition  Numbness (i.e., loss of sensation) in hand or fingers  Answer Assessment - Initial Assessment Questions 1. ONSET: "When did the pain start?"     Last night  2. LOCATION: "Where is the pain located?"     L shoulder  3. PAIN: "How bad is the pain?" (Scale 1-10; or mild, moderate, severe)   - MILD (1-3): doesn't interfere with normal activities   - MODERATE (4-7): interferes with normal activities (e.g., work or school) or awakens from sleep   - SEVERE (8-10): excruciating pain, unable to do any normal activities, unable to move arm at all due to pain     Mild  4. WORK OR EXERCISE: "Has there been any recent work or exercise that involved this part of the body?"     Was working out last night  6. OTHER SYMPTOMS: "Do you have any other symptoms?" (e.g., neck pain, swelling, rash, fever, numbness, weakness)     Numbness down to wrist  Protocols used: Shoulder  Pain-A-AH

## 2022-08-01 ENCOUNTER — Encounter: Payer: Self-pay | Admitting: Nurse Practitioner

## 2022-08-01 ENCOUNTER — Inpatient Hospital Stay: Admission: RE | Admit: 2022-08-01 | Payer: BC Managed Care – PPO | Source: Ambulatory Visit

## 2022-08-01 ENCOUNTER — Ambulatory Visit: Payer: BC Managed Care – PPO | Admitting: Nurse Practitioner

## 2022-08-01 VITALS — BP 110/74 | HR 71 | Temp 98.1°F | Ht 62.99 in | Wt 256.7 lb

## 2022-08-01 DIAGNOSIS — M5412 Radiculopathy, cervical region: Secondary | ICD-10-CM | POA: Diagnosis not present

## 2022-08-01 MED ORDER — GABAPENTIN 600 MG PO TABS: 300.0000 mg | ORAL_TABLET | Freq: Every day | ORAL | 1 refills | Status: AC

## 2022-08-01 MED ORDER — METHOCARBAMOL 500 MG PO TABS: 500.0000 mg | ORAL_TABLET | Freq: Three times a day (TID) | ORAL | 1 refills | Status: AC | PRN

## 2022-08-01 NOTE — Patient Instructions (Addendum)
Cervical radiculopathy physical therapy exercises Shoulder impingement physical therapy  Cervical Radiculopathy  Cervical radiculopathy means that a nerve in the neck (a cervical nerve) is pinched or bruised. This can happen because of an injury to the cervical spine (vertebrae) in the neck, or as a normal part of getting older. This condition can cause pain or loss of feeling (numbness) that runs from your neck all the way down to your arm and fingers. Often, this condition gets better with rest. Treatment may be needed if the condition does not get better. What are the causes? A neck injury. A bulging disk in your spine. Sudden muscle tightening (muscle spasms). Tight muscles in your neck due to overuse. Arthritis. Breakdown in the bones and joints of the spine (spondylosis) due to getting older. Bone spurs that form near the nerves in the neck. What are the signs or symptoms? Pain. The pain may: Run from the neck to the arm and hand. Be very bad or irritating. Get worse when you move your neck. Loss of feeling or tingling in your arm or hand. Weakness in your arm or hand, in very bad cases. How is this treated? In many cases, treatment is not needed for this condition. With rest, the condition often gets better over time. If treatment is needed, options may include: Wearing a soft neck collar (cervical collar) for short periods of time. Doing exercises (physical therapy) to strengthen your neck muscles. Taking medicines. Having shots (injections) in your spine, in very bad cases. Having surgery. This may be needed if other treatments do not help. The type of surgery that is used will depend on the cause of your condition. Follow these instructions at home: If you have a soft neck collar: Wear it as told by your doctor. Take it off only as told by your doctor. Ask your doctor if you can take the collar off for cleaning and bathing. If you are allowed to take the collar off for  cleaning or bathing: Follow instructions from your doctor about how to take off the collar safely. Clean the collar by wiping it with mild soap and water and drying it completely. Take out any removable pads in the collar every 1-2 days. Wash them by hand with soap and water. Let them air-dry completely before you put them back in the collar. Check your skin under the collar for redness or sores. If you see any, tell your doctor. Managing pain     Take over-the-counter and prescription medicines only as told by your doctor. If told, put ice on the painful area. To do this: If you have a soft neck collar, take if off as told by your doctor. Put ice in a plastic bag. Place a towel between your skin and the bag. Leave the ice on for 20 minutes, 2-3 times a day. Take off the ice if your skin turns bright red. This is very important. If you cannot feel pain, heat, or cold, you have a greater risk of damage to the area. If using ice does not help, you can try using heat. Use the heat source that your doctor recommends, such as a moist heat pack or a heating pad. Place a towel between your skin and the heat source. Leave the heat on for 20-30 minutes. Take off the heat if your skin turns bright red. This is very important. If you cannot feel pain, heat, or cold, you have a greater risk of getting burned. You may try a gentle  neck and shoulder rub (massage). Activity Rest as needed. Return to your normal activities when your doctor says that it is safe. Do exercises as told by your doctor or physical therapist. You may have to avoid lifting. Ask your doctor how much you can safely lift. General instructions Use a flat pillow when you sleep. Do not drive while wearing a soft neck collar. If you do not have a soft neck collar, ask your doctor if it is safe to drive while your neck heals. Ask your doctor if you should avoid driving or using machines while you are taking your medicine. Do not smoke  or use any products that contain nicotine or tobacco. If you need help quitting, ask your doctor. Keep all follow-up visits. Contact a doctor if: Your condition does not get better with treatment. Get help right away if: Your pain gets worse and medicine does not help. You lose feeling or feel weak in your hand, arm, face, or leg. You have a high fever. Your neck is stiff. You cannot control when you poop or pee (have incontinence). You have trouble with walking, balance, or talking. Summary Cervical radiculopathy means that a nerve in the neck is pinched or bruised. A nerve can get pinched from a bulging disk, arthritis, an injury to the neck, or other causes. Symptoms include pain, tingling, or loss of feeling that goes from the neck to the arm or hand. Weakness in your arm or hand can happen in very bad cases. Treatment may include resting, wearing a soft neck collar, and doing exercises. You might need to take medicines for pain. In very bad cases, shots or surgery may be needed. This information is not intended to replace advice given to you by your health care provider. Make sure you discuss any questions you have with your health care provider. Document Revised: 03/07/2021 Document Reviewed: 03/07/2021 Elsevier Patient Education  [place].

## 2022-08-01 NOTE — Assessment & Plan Note (Signed)
Acute to left side with arm numbness intermittent and discomfort along trapezius.  At this time will continue Gabapentin, which is offering benefit -- refill sent, he has been taking leftover with benefit.  Script for Robaxin to take as needed.  Recommend OTC Voltaren gel and Icy/Hot patches. Would benefit physical therapy, but due to work hard to schedule.  Recommend PT exercises at home and guided him on web sites to utilize to perform at home exercises.  If worsening or ongoing then return to office.  No red flag symptoms today.

## 2022-08-01 NOTE — Progress Notes (Signed)
BP 110/74   Pulse 71   Temp 98.1 F (36.7 C) (Oral)   Ht 5' 2.99" (1.6 m)   Wt 256 lb 11.2 oz (116.4 kg)   SpO2 97%   BMI 45.48 kg/m    Subjective:    Patient ID: Lance Kim, male    DOB: Sep 27, 1970, 51 y.o.   MRN: [id number]  HPI: Lance Kim is a 51 y.o. male  Chief Complaint  Patient presents with   Arm Numbness    Has been off and on for months   ARM NUMBNESS Has had left arm numbness on and off for the past few months.  Notices this more intensely after working out.  Started after episode of left shoulder pain.  Right hand dominant.    Had similar episode one year ago with imaging done noting left shoulder mild degenerative changes and cervical spine noting degenerative changes & neural foraminal narrowing. Duration: months Location: left Mechanism of injury: unknown Onset: gradual Severity: mild  Quality:  aching numbness Frequency: intermittent Radiation: no Aggravating factors: working out at times  Alleviating factors: Gabapentin  Status: stable Treatments attempted: Gabapentin, heat  Relief with NSAIDs?:  No NSAIDs Taken Swelling: no Redness: no  Warmth: no Trauma: no Chest pain: no  Shortness of breath: no  Fever: no Decreased sensation:  occasional to fingers Paresthesias: no Weakness: no   Relevant past medical, surgical, family and social history reviewed and updated as indicated. Interim medical history since our last visit reviewed. Allergies and medications reviewed and updated.  Review of Systems  Constitutional:  Negative for activity change, diaphoresis, fatigue and fever.  Respiratory:  Negative for cough, chest tightness, shortness of breath and wheezing.   Cardiovascular:  Negative for chest pain, palpitations and leg swelling.  Gastrointestinal: Negative.   Endocrine: Negative.   Musculoskeletal:  Positive for arthralgias.  Neurological:  Positive for numbness. Negative for dizziness, tremors, facial asymmetry, weakness  and headaches.  Psychiatric/Behavioral: Negative.      Per HPI unless specifically indicated above     Objective:    BP 110/74   Pulse 71   Temp 98.1 F (36.7 C) (Oral)   Ht 5' 2.99" (1.6 m)   Wt 256 lb 11.2 oz (116.4 kg)   SpO2 97%   BMI 45.48 kg/m   Wt Readings from Last 3 Encounters:  08/01/22 256 lb 11.2 oz (116.4 kg)  06/30/22 258 lb 8 oz (117.3 kg)  11/29/21 248 lb 3.2 oz (112.6 kg)    Physical Exam Vitals and nursing note reviewed.  Constitutional:      General: He is awake. He is not in acute distress.    Appearance: He is well-developed and well-groomed. He is obese. He is not ill-appearing or toxic-appearing.  HENT:     Head: Normocephalic.     Right Ear: Hearing normal. No drainage.     Left Ear: Hearing normal. No drainage.  Eyes:     General: Lids are normal.        Right eye: No discharge.        Left eye: No discharge.     Conjunctiva/sclera: Conjunctivae normal.     Pupils: Pupils are equal, round, and reactive to light.  Neck:     Vascular: No carotid bruit.  Cardiovascular:     Rate and Rhythm: Normal rate and regular rhythm.     Heart sounds: Normal heart sounds, S1 normal and S2 normal. No murmur heard.    No gallop.  Pulmonary:     Effort: Pulmonary effort is normal. No accessory muscle usage or respiratory distress.     Breath sounds: Normal breath sounds.  Abdominal:     General: Bowel sounds are normal.     Palpations: Abdomen is soft.  Musculoskeletal:     Right shoulder: Normal.     Left shoulder: Tenderness (along trapezius) present. No swelling, effusion, laceration, bony tenderness or crepitus. Normal range of motion. Normal strength. Normal pulse.     Cervical back: Normal range of motion and neck supple.     Right lower leg: No edema.     Left lower leg: No edema.     Comments: Grip strength equal bilaterally.  No decreased in ROM.  Skin:    General: Skin is warm and dry.     Capillary Refill: Capillary refill takes less than 2  seconds.     Findings: No rash.  Neurological:     Mental Status: He is alert and oriented to person, place, and time.     Cranial Nerves: Cranial nerves 2-12 are intact.     Sensory: Sensation is intact.     Motor: No weakness.     Coordination: Coordination is intact.     Deep Tendon Reflexes: Reflexes are normal and symmetric.     Reflex Scores:      Brachioradialis reflexes are 2+ on the right side and 2+ on the left side.      Patellar reflexes are 2+ on the right side and 2+ on the left side. Psychiatric:        Attention and Perception: Attention normal.        Mood and Affect: Mood normal.        Speech: Speech normal.        Behavior: Behavior normal. Behavior is cooperative.        Thought Content: Thought content normal.    Results for orders placed or performed in visit on 06/30/22  Bayer DCA Hb A1c Waived  Result Value Ref Range   HB A1C (BAYER DCA - WAIVED) 6.6 (H) 4.8 - 5.6 %  CBC with Differential/Platelet  Result Value Ref Range   WBC 8.9 3.4 - 10.8 x10E3/uL   RBC 5.17 4.14 - 5.80 x10E6/uL   Hemoglobin 14.8 13.0 - 17.7 g/dL   Hematocrit 43.6 37.5 - 51.0 %   MCV 84 79 - 97 fL   MCH 28.6 26.6 - 33.0 pg   MCHC 33.9 31.5 - 35.7 g/dL   RDW 13.3 11.6 - 15.4 %   Platelets 290 150 - 450 x10E3/uL   Neutrophils 44 Not Estab. %   Lymphs 43 Not Estab. %   Monocytes 9 Not Estab. %   Eos 3 Not Estab. %   Basos 1 Not Estab. %   Neutrophils Absolute 3.9 1.4 - 7.0 x10E3/uL   Lymphocytes Absolute 3.8 (H) 0.7 - 3.1 x10E3/uL   Monocytes Absolute 0.8 0.1 - 0.9 x10E3/uL   EOS (ABSOLUTE) 0.3 0.0 - 0.4 x10E3/uL   Basophils Absolute 0.1 0.0 - 0.2 x10E3/uL   Immature Granulocytes 0 Not Estab. %   Immature Grans (Abs) 0.0 0.0 - 0.1 x10E3/uL  Comprehensive metabolic panel  Result Value Ref Range   Glucose 116 (H) 70 - 99 mg/dL   BUN 9 6 - 24 mg/dL   Creatinine, Ser 1.04 0.76 - 1.27 mg/dL   eGFR 86 >59 mL/min/1.73   BUN/Creatinine Ratio 9 9 - 20   Sodium 139 134 - 144 mmol/L  Potassium 4.1 3.5 - 5.2 mmol/L   Chloride 101 96 - 106 mmol/L   CO2 20 20 - 29 mmol/L   Calcium 9.5 8.7 - 10.2 mg/dL   Total Protein 7.3 6.0 - 8.5 g/dL   Albumin 4.3 3.8 - 4.9 g/dL   Globulin, Total 3.0 1.5 - 4.5 g/dL   Albumin/Globulin Ratio 1.4 1.2 - 2.2   Bilirubin Total <0.2 0.0 - 1.2 mg/dL   Alkaline Phosphatase 91 44 - 121 IU/L   AST 26 0 - 40 IU/L   ALT 35 0 - 44 IU/L  Lipid Panel w/o Chol/HDL Ratio  Result Value Ref Range   Cholesterol, Total 152 100 - 199 mg/dL   Triglycerides 165 (H) 0 - 149 mg/dL   HDL 43 >39 mg/dL   VLDL Cholesterol Cal 28 5 - 40 mg/dL   LDL Chol Calc (NIH) 81 0 - 99 mg/dL  TSH  Result Value Ref Range   TSH 2.370 0.450 - 4.500 uIU/mL  PSA  Result Value Ref Range   Prostate Specific Ag, Serum 0.5 0.0 - 4.0 ng/mL  VITAMIN D 25 Hydroxy (Vit-D Deficiency, Fractures)  Result Value Ref Range   Vit D, 25-Hydroxy 46.7 30.0 - 100.0 ng/mL      Assessment & Plan:   Problem List Items Addressed This Visit       Nervous and Auditory   Cervical radiculopathy - Primary    Acute to left side with arm numbness intermittent and discomfort along trapezius.  At this time will continue Gabapentin, which is offering benefit -- refill sent, he has been taking leftover with benefit.  Script for Robaxin to take as needed.  Recommend OTC Voltaren gel and Icy/Hot patches. Would benefit physical therapy, but due to work hard to schedule.  Recommend PT exercises at home and guided him on web sites to utilize to perform at home exercises.  If worsening or ongoing then return to office.  No red flag symptoms today.      Relevant Medications   gabapentin (NEURONTIN) 600 MG tablet   methocarbamol (ROBAXIN) 500 MG tablet     Follow up plan: Return if symptoms worsen or fail to improve.

## 2022-08-04 ENCOUNTER — Ambulatory Visit
Admission: RE | Admit: 2022-08-04 | Discharge: 2022-08-04 | Disposition: A | Discharge status: Home / Self Care | Payer: BC Managed Care – PPO | Source: Ambulatory Visit | Attending: Acute Care | Admitting: Acute Care

## 2022-08-04 DIAGNOSIS — F17200 Nicotine dependence, unspecified, uncomplicated: Secondary | ICD-10-CM | POA: Insufficient documentation

## 2022-08-04 DIAGNOSIS — Z87891 Personal history of nicotine dependence: Secondary | ICD-10-CM | POA: Insufficient documentation

## 2022-08-04 DIAGNOSIS — F17210 Nicotine dependence, cigarettes, uncomplicated: Secondary | ICD-10-CM | POA: Insufficient documentation

## 2022-08-06 ENCOUNTER — Other Ambulatory Visit: Payer: Self-pay

## 2022-08-06 DIAGNOSIS — F17210 Nicotine dependence, cigarettes, uncomplicated: Secondary | ICD-10-CM

## 2022-08-06 DIAGNOSIS — Z122 Encounter for screening for malignant neoplasm of respiratory organs: Secondary | ICD-10-CM

## 2022-08-06 DIAGNOSIS — Z87891 Personal history of nicotine dependence: Secondary | ICD-10-CM

## 2022-08-18 ENCOUNTER — Other Ambulatory Visit: Payer: Self-pay | Admitting: Nurse Practitioner

## 2022-08-18 NOTE — Telephone Encounter (Signed)
Requested Prescriptions  Pending Prescriptions Disp Refills   meloxicam (MOBIC) 7.5 MG tablet [Pharmacy Med Name: MELOXICAM 7.5MG TABLETS] 180 tablet 0    Sig: TAKE 2 TABLETS(15 MG) BY MOUTH DAILY     Analgesics:  COX2 Inhibitors Failed - 08/18/2022  6:26 AM      Failed - Manual Review: Labs are only required if the patient has taken medication for more than 8 weeks.      Passed - HGB in normal range and within 360 days    Hemoglobin  Date Value Ref Range Status  06/30/2022 14.8 13.0 - 17.7 g/dL Final         Passed - Cr in normal range and within 360 days    Creatinine, Ser  Date Value Ref Range Status  06/30/2022 1.04 0.76 - 1.27 mg/dL Final         Passed - HCT in normal range and within 360 days    Hematocrit  Date Value Ref Range Status  06/30/2022 43.6 37.5 - 51.0 % Final         Passed - AST in normal range and within 360 days    AST  Date Value Ref Range Status  06/30/2022 26 0 - 40 IU/L Final         Passed - ALT in normal range and within 360 days    ALT  Date Value Ref Range Status  06/30/2022 35 0 - 44 IU/L Final         Passed - eGFR is 30 or above and within 360 days    GFR calc Af Amer  Date Value Ref Range Status  05/25/2020 90 >59 mL/min/1.73 Final    Comment:    **[facility] currently reports eGFR in compliance with the current**   recommendations of the [employer]. [facility] will   update reporting as new guidelines are published from the NKF-ASN   Task force.    GFR calc non Af Amer  Date Value Ref Range Status  05/25/2020 78 >59 mL/min/1.73 Final   eGFR  Date Value Ref Range Status  06/30/2022 86 >59 mL/min/1.73 Final         Passed - Patient is not pregnant      Passed - Valid encounter within last 12 months    Recent Outpatient Visits           2 weeks ago Cervical radiculopathy   [place], Jolene T, NP   1 month ago Type 2 diabetes mellitus with obesity ([place])   [place], [place] T, NP   2 months ago Viral upper respiratory tract infection   [facility] Mecum, Erin E, PA-C   8 months ago Type 2 diabetes mellitus with obesity ([place])   [place] Cannady, Jolene T, NP   11 months ago Chronic left shoulder pain   [place], Barbaraann Faster, NP       Future Appointments             In 4 months Cannady, Barbaraann Faster, NP [employer] [facility], [facility]

## 2022-09-12 ENCOUNTER — Other Ambulatory Visit: Payer: Self-pay | Admitting: Nurse Practitioner

## 2022-09-13 NOTE — Telephone Encounter (Signed)
Requested Prescriptions  Pending Prescriptions Disp Refills   amLODipine (NORVASC) 10 MG tablet [Pharmacy Med Name: AMLODIPINE BESYLATE '10MG'$  TABLETS] 90 tablet 0    Sig: TAKE 1 TABLET(10 MG) BY MOUTH DAILY     Cardiovascular: Calcium Channel Blockers 2 Passed - 09/12/2022  9:49 AM      Passed - Last BP in normal range    BP Readings from Last 1 Encounters:  08/01/22 110/74         Passed - Last Heart Rate in normal range    Pulse Readings from Last 1 Encounters:  08/01/22 71         Passed - Valid encounter within last 6 months    Recent Outpatient Visits           1 month ago Cervical radiculopathy   [place], Jolene T, NP   2 months ago Type 2 diabetes mellitus with obesity ([place])   [place], [place] T, NP   2 months ago Viral upper respiratory tract infection   [facility] Mecum, Erin E, PA-C   9 months ago Type 2 diabetes mellitus with obesity ([place])   [place], Henrine Screws T, NP   1 year ago Chronic left shoulder pain   [place], Barbaraann Faster, NP       Future Appointments             In 3 months Cannady, Barbaraann Faster, NP [facility], [facility]

## 2022-11-29 ENCOUNTER — Other Ambulatory Visit: Payer: Self-pay | Admitting: Nurse Practitioner

## 2022-12-01 NOTE — Telephone Encounter (Signed)
Requested Prescriptions  Pending Prescriptions Disp Refills   cetirizine (ZYRTEC) 10 MG tablet [Pharmacy Med Name: CETIRIZINE 10MG  TABLETS] 90 tablet 0    Sig: TAKE 1 TABLET(10 MG) BY MOUTH DAILY     Ear, Nose, and Throat:  Antihistamines 2 Passed - 11/29/2022  6:21 AM      Passed - Cr in normal range and within 360 days    Creatinine, Ser  Date Value Ref Range Status  06/30/2022 1.04 0.76 - 1.27 mg/dL Final         Passed - Valid encounter within last 12 months    Recent Outpatient Visits           4 months ago Cervical radiculopathy   [place], [place] T, NP   5 months ago Type 2 diabetes mellitus with obesity ([place])   [place], Henrine Screws T, NP   5 months ago Viral upper respiratory tract infection   [place] [facility] Mecum, Erin E, PA-C   1 year ago Type 2 diabetes mellitus with obesity ([place])   [place], Henrine Screws T, NP   1 year ago Chronic left shoulder pain   [place], Barbaraann Faster, NP       Future Appointments             In 4 weeks Cannady, [place] T, NP [place], [facility]             amLODipine (NORVASC) 10 MG tablet [Pharmacy Med Name: AMLODIPINE BESYLATE 10MG  TABLETS] 90 tablet 0    Sig: TAKE 1 TABLET(10 MG) BY MOUTH DAILY     Cardiovascular: Calcium Channel Blockers 2 Passed - 11/29/2022  6:21 AM      Passed - Last BP in normal range    BP Readings from Last 1 Encounters:  08/01/22 110/74         Passed - Last Heart Rate in normal range    Pulse Readings from Last 1 Encounters:  08/01/22 71         Passed - Valid encounter within last 6 months    Recent Outpatient Visits           4 months ago Cervical radiculopathy   [place], [place] T, NP   5 months ago Type 2 diabetes mellitus with obesity ([place])   [place], [place] T, NP   5 months ago Viral upper respiratory tract infection   [place] [facility] Mecum, Erin E, PA-C   1 year ago Type 2 diabetes mellitus with obesity ([place])   [place], Henrine Screws T, NP   1 year ago Chronic left shoulder pain   [place], Barbaraann Faster, NP       Future Appointments             In 4 weeks Cannady, Barbaraann Faster, NP [place], [facility]

## 2022-12-30 ENCOUNTER — Ambulatory Visit: Payer: Managed Care, Other (non HMO) | Admitting: Nurse Practitioner

## 2023-01-08 ENCOUNTER — Telehealth: Payer: Self-pay

## 2023-01-08 ENCOUNTER — Other Ambulatory Visit: Payer: Self-pay | Admitting: Nurse Practitioner

## 2023-01-08 NOTE — Telephone Encounter (Signed)
Requested medication (s) are due for refill today: yes  Requested medication (s) are on the active medication list: yes  Last refill:  12/01/22  Future visit scheduled: yes  Notes to clinic:   Manual Review: Route requests for 50,000 IU strength to the provider      Requested Prescriptions  Pending Prescriptions Disp Refills   Cholecalciferol (VITAMIN D3) 1.25 MG (50000 UT) CAPS [Pharmacy Med Name: VITAMIN D3 50,000 IU (CHOLE) CAP] 12 capsule     Sig: TAKE 1 CAPSULE BY MOUTH ONCE A WEEK     Endocrinology:  Vitamins - Vitamin D Supplementation 2 Failed - 01/08/2023  3:33 AM      Failed - Manual Review: Route requests for 50,000 IU strength to the provider      Passed - Ca in normal range and within 360 days    Calcium  Date Value Ref Range Status  06/30/2022 9.5 8.7 - 10.2 mg/dL Final         Passed - Vitamin D in normal range and within 360 days    Vit D, 25-Hydroxy  Date Value Ref Range Status  06/30/2022 46.7 30.0 - 100.0 ng/mL Final    Comment:    Vitamin D deficiency has been defined by the Institute of Medicine and an Endocrine Society practice guideline as a level of serum 25-OH vitamin D less than 20 ng/mL (1,2). The Endocrine Society went on to further define vitamin D insufficiency as a level between 21 and 29 ng/mL (2). 1. IOM (Institute of Medicine). 2010. Dietary reference    intakes for calcium and D. [place]: The    [employer]. 2. Holick MF, Binkley [place], Bischoff-Ferrari HA, et al.    Evaluation, treatment, and prevention of vitamin D    deficiency: an Endocrine Society clinical practice    guideline. JCEM. 2011 Jul; 96(7):1911-30.          Passed - Valid encounter within last 12 months    Recent Outpatient Visits           5 months ago Cervical radiculopathy   [place] [facility] [place], [place] T, NP   6 months ago Type 2 diabetes mellitus with obesity (HCC)   [place] [facility] [place], Corrie Dandy  T, NP   6 months ago Viral upper respiratory tract infection   [place] [facility] Mecum, Erin E, PA-C   1 year ago Type 2 diabetes mellitus with obesity (HCC)   [place] [facility] [place], Corrie Dandy T, NP   1 year ago Chronic left shoulder pain   [facility] [place], Dorie Rank, NP

## 2023-01-08 NOTE — Telephone Encounter (Signed)
Error

## 2023-02-27 ENCOUNTER — Other Ambulatory Visit: Payer: Self-pay | Admitting: Nurse Practitioner

## 2023-02-27 NOTE — Telephone Encounter (Signed)
Appt- 03/03/23 Requested Prescriptions  Pending Prescriptions Disp Refills   atorvastatin (LIPITOR) 20 MG tablet [Pharmacy Med Name: ATORVASTATIN 20MG  TABLETS] 90 tablet 1    Sig: TAKE 1 TABLET(20 MG) BY MOUTH DAILY     Cardiovascular:  Antilipid - Statins Failed - 02/27/2023  6:21 AM      Failed - Lipid Panel in normal range within the last 12 months    Cholesterol, Total  Date Value Ref Range Status  06/30/2022 152 100 - 199 mg/dL Final   LDL Chol Calc (NIH)  Date Value Ref Range Status  06/30/2022 81 0 - 99 mg/dL Final   HDL  Date Value Ref Range Status  06/30/2022 43 >39 mg/dL Final   Triglycerides  Date Value Ref Range Status  06/30/2022 165 (H) 0 - 149 mg/dL Final         Passed - Patient is not pregnant      Passed - Valid encounter within last 12 months    Recent Outpatient Visits           7 months ago Cervical radiculopathy   [place] [facility] [place], [place] T, NP   8 months ago Type 2 diabetes mellitus with obesity (HCC)   [place] [facility] [place], [place] T, NP   8 months ago Viral upper respiratory tract infection   [place] [facility] Mecum, Erin E, PA-C   1 year ago Type 2 diabetes mellitus with obesity (HCC)   [place] [facility] [place], Corrie Dandy T, NP   1 year ago Chronic left shoulder pain   [place] [facility] [place], Dorie Rank, NP       Future Appointments             In 4 days [place], Dorie Rank, NP [place] [facility]             cetirizine (ZYRTEC) 10 MG tablet [Pharmacy Med Name: CETIRIZINE 10MG  TABLETS] 90 tablet 0    Sig: TAKE 1 TABLET(10 MG) BY MOUTH DAILY     Ear, Nose, and Throat:  Antihistamines 2 Passed - 02/27/2023  6:21 AM      Passed - Cr in normal range and within 360 days    Creatinine, Ser  Date Value Ref Range Status  06/30/2022 1.04 0.76 - 1.27 mg/dL Final         Passed - Valid encounter within last 12  months    Recent Outpatient Visits           7 months ago Cervical radiculopathy   [place] [facility] [place], [place] T, NP   8 months ago Type 2 diabetes mellitus with obesity (HCC)   [place] [facility] [place], [place] T, NP   8 months ago Viral upper respiratory tract infection   [place] [facility] Mecum, Erin E, PA-C   1 year ago Type 2 diabetes mellitus with obesity (HCC)   [place] [facility] [place], Corrie Dandy T, NP   1 year ago Chronic left shoulder pain   [place] [facility] [place], Dorie Rank, NP       Future Appointments             In 4 days [place], Dorie Rank, NP  [facility]             amLODipine (NORVASC) 10 MG tablet [Pharmacy Med Name: AMLODIPINE BESYLATE 10MG  TABLETS]  90 tablet 0    Sig: TAKE 1 TABLET(10 MG) BY MOUTH DAILY     Cardiovascular: Calcium Channel Blockers 2 Failed - 02/27/2023  6:21 AM      Failed - Valid encounter within last 6 months    Recent Outpatient Visits           7 months ago Cervical radiculopathy   [place] [facility] [place], [place] T, NP   8 months ago Type 2 diabetes mellitus with obesity (HCC)   [place] [facility] [place], Corrie Dandy T, NP   8 months ago Viral upper respiratory tract infection   [place] [facility] Mecum, Erin E, PA-C   1 year ago Type 2 diabetes mellitus with obesity (HCC)   [place] [facility] [place], Corrie Dandy T, NP   1 year ago Chronic left shoulder pain   [place] [facility] [place], Dorie Rank, NP       Future Appointments             In 4 days [place], Corrie Dandy T, NP [place] [facility]            Passed - Last BP in normal range    BP Readings from Last 1 Encounters:  08/01/22 110/74         Passed - Last Heart Rate in normal range    Pulse Readings from Last 1 Encounters:   08/01/22 71

## 2023-03-01 NOTE — Patient Instructions (Signed)
Be Involved in Your Health Care:  Taking Medications When medications are taken as directed, they can greatly improve your health. But if they are not taken as instructed, they may not work. In some cases, not taking them correctly can be harmful. To help ensure your treatment remains effective and safe, understand your medications and how to take them.  Your lab results, notes and after visit summary will be available on My Chart. We strongly encourage you to use this feature. If lab results are abnormal the clinic will contact you with the appropriate steps. If the clinic does not contact you assume the results are satisfactory. You can always see your results on My Chart. If you have questions regarding your condition, please contact the clinic during office hours. You can also ask questions on My Chart.  We at [facility] are grateful that you chose us to provide care. We strive to provide excellent and compassionate care and are always looking for feedback. If you get a survey from the clinic please complete this.   Diabetes Mellitus Basics  Diabetes mellitus, or diabetes, is a long-term (chronic) disease. It occurs when the body does not properly use sugar (glucose) that is released from food after you eat. Diabetes mellitus may be caused by one or both of these problems: Your pancreas does not make enough of a hormone called insulin. Your body does not react in a normal way to the insulin that it makes. Insulin lets glucose enter cells in your body. This gives you energy. If you have diabetes, glucose cannot get into cells. This causes high blood glucose (hyperglycemia). How to treat and manage diabetes You may need to take insulin or other diabetes medicines daily to keep your glucose in balance. If you are prescribed insulin, you will learn how to give yourself insulin by injection. You may need to adjust the amount of insulin you take based on the foods that you eat. You will  need to check your blood glucose levels using a glucose monitor as told by your health care provider. The readings can help determine if you have low or high blood glucose. Generally, you should have these blood glucose levels: Before meals (preprandial): 80-130 mg/dL (4.4-7.2 mmol/L). After meals (postprandial): below 180 mg/dL (10 mmol/L). Hemoglobin A1c (HbA1c) level: less than 7%. Your health care provider will set treatment goals for you. Keep all follow-up visits. This is important. Follow these instructions at home: Diabetes medicines Take your diabetes medicines every day as told by your health care provider. List your diabetes medicines here: Name of medicine: ______________________________ Amount (dose): _______________ Time (a.m./p.m.): _______________ Notes: ___________________________________ Name of medicine: ______________________________ Amount (dose): _______________ Time (a.m./p.m.): _______________ Notes: ___________________________________ Name of medicine: ______________________________ Amount (dose): _______________ Time (a.m./p.m.): _______________ Notes: ___________________________________ Insulin If you use insulin, list the types of insulin you use here: Insulin type: ______________________________ Amount (dose): _______________ Time (a.m./p.m.): _______________Notes: ___________________________________ Insulin type: ______________________________ Amount (dose): _______________ Time (a.m./p.m.): _______________ Notes: ___________________________________ Insulin type: ______________________________ Amount (dose): _______________ Time (a.m./p.m.): _______________ Notes: ___________________________________ Insulin type: ______________________________ Amount (dose): _______________ Time (a.m./p.m.): _______________ Notes: ___________________________________ Insulin type: ______________________________ Amount (dose): _______________ Time (a.m./p.m.): _______________  Notes: ___________________________________ Managing blood glucose  Check your blood glucose levels using a glucose monitor as told by your health care provider. Write down the times that you check your glucose levels here: Time: _______________ Notes: ___________________________________ Time: _______________ Notes: ___________________________________ Time: _______________ Notes: ___________________________________ Time: _______________ Notes: ___________________________________ Time: _______________ Notes: ___________________________________ Time: _______________ Notes: ___________________________________    Low blood glucose Low blood glucose (hypoglycemia) is when glucose is at or below 70 mg/dL (3.9 mmol/L). Symptoms may include: Feeling: Hungry. Sweaty and clammy. Irritable or easily upset. Dizzy. Sleepy. Having: A fast heartbeat. A headache. A change in your vision. Numbness around the mouth, lips, or tongue. Having trouble with: Moving (coordination). Sleeping. Treating low blood glucose To treat low blood glucose, eat or drink something containing sugar right away. If you can think clearly and swallow safely, follow the 15:15 rule: Take 15 grams of a fast-acting carb (carbohydrate), as told by your health care provider. Some fast-acting carbs are: Glucose tablets: take 3-4 tablets. Hard candy: eat 3-5 pieces. Fruit juice: drink 4 oz (120 mL). Regular (not diet) soda: drink 4-6 oz (120-180 mL). Honey or sugar: eat 1 Tbsp (15 mL). Check your blood glucose levels 15 minutes after you take the carb. If your glucose is still at or below 70 mg/dL (3.9 mmol/L), take 15 grams of a carb again. If your glucose does not go above 70 mg/dL (3.9 mmol/L) after 3 tries, get help right away. After your glucose goes back to normal, eat a meal or a snack within 1 hour. Treating very low blood glucose If your glucose is at or below 54 mg/dL (3 mmol/L), you have very low blood glucose  (severe hypoglycemia). This is an emergency. Do not wait to see if the symptoms will go away. Get medical help right away. Call your local emergency services (911 in the [place] Do not drive yourself to the hospital. Questions to ask your health care provider Should I talk with a diabetes educator? What equipment will I need to care for myself at home? What diabetes medicines do I need? When should I take them? How often do I need to check my blood glucose levels? What number can I call if I have questions? When is my follow-up visit? Where can I find a support group for people with diabetes? Where to find more information American Diabetes Association: www.diabetes.org Association of Diabetes Care and Education Specialists: www.diabeteseducator.org Contact a health care provider if: Your blood glucose is at or above 240 mg/dL (13.3 mmol/L) for 2 days in a row. You have been sick or have had a fever for 2 days or more, and you are not getting better. You have any of these problems for more than 6 hours: You cannot eat or drink. You feel nauseous. You vomit. You have diarrhea. Get help right away if: Your blood glucose is lower than 54 mg/dL (3 mmol/L). You get confused. You have trouble thinking clearly. You have trouble breathing. These symptoms may represent a serious problem that is an emergency. Do not wait to see if the symptoms will go away. Get medical help right away. Call your local emergency services (911 in the [place] Do not drive yourself to the hospital. Summary Diabetes mellitus is a chronic disease that occurs when the body does not properly use sugar (glucose) that is released from food after you eat. Take insulin and diabetes medicines as told. Check your blood glucose every day, as often as told. Keep all follow-up visits. This is important. This information is not intended to replace advice given to you by your health care provider. Make sure you discuss any  questions you have with your health care provider. Document Revised: 01/03/2020 Document Reviewed: 01/03/2020 Elsevier Patient Education  2024 [employer]  

## 2023-03-03 ENCOUNTER — Encounter: Payer: Self-pay | Admitting: Nurse Practitioner

## 2023-03-03 ENCOUNTER — Ambulatory Visit: Payer: Managed Care, Other (non HMO) | Admitting: Nurse Practitioner

## 2023-03-03 VITALS — BP 118/79 | HR 61 | Temp 98.5°F | Ht 62.99 in | Wt 248.0 lb

## 2023-03-03 DIAGNOSIS — E1169 Type 2 diabetes mellitus with other specified complication: Secondary | ICD-10-CM | POA: Diagnosis not present

## 2023-03-03 DIAGNOSIS — E1159 Type 2 diabetes mellitus with other circulatory complications: Secondary | ICD-10-CM

## 2023-03-03 DIAGNOSIS — J432 Centrilobular emphysema: Secondary | ICD-10-CM | POA: Diagnosis not present

## 2023-03-03 DIAGNOSIS — G4733 Obstructive sleep apnea (adult) (pediatric): Secondary | ICD-10-CM

## 2023-03-03 DIAGNOSIS — Z6841 Body Mass Index (BMI) 40.0 and over, adult: Secondary | ICD-10-CM

## 2023-03-03 DIAGNOSIS — I152 Hypertension secondary to endocrine disorders: Secondary | ICD-10-CM

## 2023-03-03 DIAGNOSIS — F325 Major depressive disorder, single episode, in full remission: Secondary | ICD-10-CM | POA: Diagnosis not present

## 2023-03-03 DIAGNOSIS — E785 Hyperlipidemia, unspecified: Secondary | ICD-10-CM

## 2023-03-03 DIAGNOSIS — E669 Obesity, unspecified: Secondary | ICD-10-CM

## 2023-03-03 DIAGNOSIS — F17210 Nicotine dependence, cigarettes, uncomplicated: Secondary | ICD-10-CM

## 2023-03-03 MED ORDER — FLUOXETINE HCL 20 MG PO CAPS: ORAL_CAPSULE | ORAL | 4 refills | Status: DC

## 2023-03-03 MED ORDER — AMLODIPINE BESYLATE 10 MG PO TABS: ORAL_TABLET | ORAL | 4 refills | Status: DC

## 2023-03-03 MED ORDER — LISINOPRIL-HYDROCHLOROTHIAZIDE 20-12.5 MG PO TABS: 1.0000 | ORAL_TABLET | Freq: Every day | ORAL | 4 refills | Status: DC

## 2023-03-03 MED ORDER — ATORVASTATIN CALCIUM 20 MG PO TABS: ORAL_TABLET | ORAL | 4 refills | Status: DC

## 2023-03-03 NOTE — Assessment & Plan Note (Signed)
I have recommended complete cessation of tobacco use. I have discussed various options available for assistance with tobacco cessation including over the counter methods (Nicotine gum, patch and lozenges). We also discussed prescription options (Chantix, Nicotine Inhaler / Nasal Spray). The patient is not interested in pursuing any prescription tobacco cessation options at this time.  

## 2023-03-03 NOTE — Assessment & Plan Note (Signed)
Chronic, stable.  Noted on CT imaging, lung cancer screening.  FEV1 99% and FEV1/FVC 95% on 11/23/20.  No current inhalers.  Recommend complete cessation of smoking and initiate inhalers as needed.  Spirometry next visit. 

## 2023-03-03 NOTE — Assessment & Plan Note (Signed)
BMI 43.94.  Has lost 8 pounds.  Recommended eating smaller high protein, low fat meals more frequently and exercising 30 mins a day 5 times a week with a goal of 10-15lb weight loss in the next 3 months. Patient voiced their understanding and motivation to adhere to these recommendations.

## 2023-03-03 NOTE — Assessment & Plan Note (Signed)
Chronic, stable with A1c 6.4% remaining off Metformin and maintaining diet + weight & urine ALB 22 November 2021 -- recheck today.  At this time wishes to maintain off medication, this is appropriate and recommend continue focus on diet and regular exercise -- his daughter is a huge support with this.  Will continue off medication at this time and continue diet focus.  Return in October for physical.

## 2023-03-03 NOTE — Assessment & Plan Note (Signed)
Chronic, stable.  Denies SI/HI.  Continue current medication regimen and adjust as needed. 

## 2023-03-03 NOTE — Assessment & Plan Note (Signed)
Chronic, stable. BP at goal in office today.  Recommend he monitor BP at least a few mornings a week at home and document.  DASH diet at home.  Continue current medication regimen and adjust as needed.  Labs today: CMP and lipid.  Return in October for physical.

## 2023-03-03 NOTE — Progress Notes (Signed)
BP 118/79   Pulse 61   Temp 98.5 F (36.9 C) (Oral)   Ht 5' 2.99" (1.6 m)   Wt 248 lb (112.5 kg)   SpO2 99%   BMI 43.94 kg/m    Subjective:    Patient ID: Lance Kim, male    DOB: 11/12/1970, 52 y.o.   MRN: [id number]  HPI: Lance Kim is a 52 y.o. male  Chief Complaint  Patient presents with   Diabetes   DIABETES A1c 6.6% October.  Remains off medication at this time -- focusing on diet and has returned to working out slowly. Has lost 8 pounds since last visit.  Has not gone for sleep study due to cost.  Takes Gabapentin only as needed for back pain, along with Meloxicam. Hypoglycemic episodes:no Polydipsia/polyuria: no Visual disturbance: no Chest pain: no Paresthesias: no Glucose Monitoring: no             Accucheck frequency: not checking             Fasting glucose:              Post prandial:             Evening:              Before meals: Taking Insulin?: no             Long acting insulin:             Short acting insulin: Blood Pressure Monitoring: not checking Retinal Examination: Not Up To Date -- Eye Lab in 2023 Foot Exam: Up to Date Pneumovax: Up to Date Influenza: Up to Date --  gets at work Aspirin: yes    HYPERTENSION / HYPERLIPIDEMIA Continues on Lisinopril-HCTZ, Amlodipine, and Atorvastatin, ASA.   Satisfied with current treatment? yes Duration of hypertension: chronic BP monitoring frequency: not checking BP range:  BP medication side effects: no Duration of hyperlipidemia: chronic Cholesterol medication side effects: no Cholesterol supplements: none Medication compliance: good compliance Aspirin: yes Recent stressors: no Recurrent headaches: no Visual changes: no Palpitations: no Dyspnea: no Chest pain: no Lower extremity edema: occasional to ankles Dizzy/lightheaded: no    COPD Attends lung screening, last 08/06/22 noting emphysema and bilateral gynecomastia.   Continues to smoke daily -- smoking 1/2, started  smoking at age 26. COPD status: stable Satisfied with current treatment?: yes Oxygen use: no Dyspnea frequency: none Cough frequency: none Rescue inhaler frequency: none Limitation of activity: no Productive cough: none Last Spirometry: today Pneumovax: Up to Date Influenza: Up To Date   DEPRESSION Continues on Prozac 20 MG daily. Mood status: stable Satisfied with current treatment?: yes Symptom severity: mild  Duration of current treatment : chronic Side effects: no Medication compliance: good compliance Psychotherapy/counseling: none Depressed mood: occasional Anxious mood: occasional Anhedonia: no Significant weight loss or gain: no Insomnia: none Fatigue: no Feelings of worthlessness or guilt: no Impaired concentration/indecisiveness: no Suicidal ideations: no Hopelessness: no Crying spells: no    03/03/2023    4:31 PM 08/01/2022    4:40 PM 06/30/2022    4:09 PM 06/16/2022    2:55 PM 11/29/2021    4:06 PM  Depression screen PHQ 2/9  Decreased Interest 1 0 0 0 1  Down, Depressed, Hopeless 1 1 0 0 1  PHQ - 2 Score 2 1 0 0 2  Altered sleeping 1 0 0 0 1  Tired, decreased energy 1 1 0 0 1  Change in appetite 1 0 0 0  1  Feeling bad or failure about yourself  1 0 0 0 0  Trouble concentrating 1 0 0 0 1  Moving slowly or fidgety/restless 0 0 0 0 0  Suicidal thoughts 0 0 0 0 0  PHQ-9 Score 7 2 0 0 6  Difficult doing work/chores Not difficult at all Not difficult at all  Not difficult at all        03/03/2023    4:32 PM 08/01/2022    4:40 PM 06/16/2022    2:56 PM 11/29/2021    4:07 PM  GAD 7 : Generalized Anxiety Score  Nervous, Anxious, on Edge 1 1 0 1  Control/stop worrying 1  0 1  Worry too much - different things 1 1 0 1  Trouble relaxing 1 1 0 0  Restless 0 0 0 0  Easily annoyed or irritable 1 1 0 0  Afraid - awful might happen 1 1 0 1  Total GAD 7 Score 6  0 4  Anxiety Difficulty Not difficult at all Not difficult at all Not difficult at all Not  difficult at all   Relevant past medical, surgical, family and social history reviewed and updated as indicated. Interim medical history since our last visit reviewed. Allergies and medications reviewed and updated.  Review of Systems  Constitutional:  Negative for activity change, diaphoresis, fatigue and fever.  Respiratory:  Negative for cough, chest tightness, shortness of breath and wheezing.   Cardiovascular:  Negative for chest pain, palpitations and leg swelling.  Gastrointestinal: Negative.   Endocrine: Negative.   Neurological: Negative.   Psychiatric/Behavioral: Negative.      Per HPI unless specifically indicated above     Objective:    BP 118/79   Pulse 61   Temp 98.5 F (36.9 C) (Oral)   Ht 5' 2.99" (1.6 m)   Wt 248 lb (112.5 kg)   SpO2 99%   BMI 43.94 kg/m   Wt Readings from Last 3 Encounters:  03/03/23 248 lb (112.5 kg)  08/01/22 256 lb 11.2 oz (116.4 kg)  06/30/22 258 lb 8 oz (117.3 kg)    Physical Exam Vitals and nursing note reviewed.  Constitutional:      General: He is awake. He is not in acute distress.    Appearance: He is well-developed and well-groomed. He is obese. He is not ill-appearing or toxic-appearing.  HENT:     Head: Normocephalic.     Right Ear: Hearing and external ear normal. No drainage.     Left Ear: Hearing and external ear normal. No drainage.     Mouth/Throat:     Pharynx: Uvula midline.  Eyes:     General: Lids are normal.        Right eye: No discharge.        Left eye: No discharge.     Conjunctiva/sclera: Conjunctivae normal.     Pupils: Pupils are equal, round, and reactive to light.  Neck:     Vascular: No carotid bruit.  Cardiovascular:     Rate and Rhythm: Normal rate and regular rhythm.     Heart sounds: Normal heart sounds, S1 normal and S2 normal. No murmur heard.    No gallop.     Comments: No edema to legs. Pulmonary:     Effort: Pulmonary effort is normal. No accessory muscle usage or respiratory  distress.     Breath sounds: Normal breath sounds.  Abdominal:     General: Bowel sounds are normal.  Palpations: Abdomen is soft.  Musculoskeletal:        General: Normal range of motion.     Cervical back: Normal range of motion and neck supple.     Right lower leg: No edema.     Left lower leg: No edema.  Skin:    General: Skin is warm and dry.     Capillary Refill: Capillary refill takes less than 2 seconds.  Neurological:     Mental Status: He is alert and oriented to person, place, and time.     Deep Tendon Reflexes: Reflexes are normal and symmetric.  Psychiatric:        Attention and Perception: Attention normal.        Mood and Affect: Mood normal.        Speech: Speech normal.        Behavior: Behavior normal. Behavior is cooperative.        Thought Content: Thought content normal.     Results for orders placed or performed in visit on 06/30/22  Bayer DCA Hb A1c Waived  Result Value Ref Range   HB A1C (BAYER DCA - WAIVED) 6.6 (H) 4.8 - 5.6 %  CBC with Differential/Platelet  Result Value Ref Range   WBC 8.9 3.4 - 10.8 x10E3/uL   RBC 5.17 4.14 - 5.80 x10E6/uL   Hemoglobin 14.8 13.0 - 17.7 g/dL   Hematocrit 16.1 09.6 - 51.0 %   MCV 84 79 - 97 fL   MCH 28.6 26.6 - 33.0 pg   MCHC 33.9 31.5 - 35.7 g/dL   RDW 04.5 40.9 - 81.1 %   Platelets 290 150 - 450 x10E3/uL   Neutrophils 44 Not Estab. %   Lymphs 43 Not Estab. %   Monocytes 9 Not Estab. %   Eos 3 Not Estab. %   Basos 1 Not Estab. %   Neutrophils Absolute 3.9 1.4 - 7.0 x10E3/uL   Lymphocytes Absolute 3.8 (H) 0.7 - 3.1 x10E3/uL   Monocytes Absolute 0.8 0.1 - 0.9 x10E3/uL   EOS (ABSOLUTE) 0.3 0.0 - 0.4 x10E3/uL   Basophils Absolute 0.1 0.0 - 0.2 x10E3/uL   Immature Granulocytes 0 Not Estab. %   Immature Grans (Abs) 0.0 0.0 - 0.1 x10E3/uL  Comprehensive metabolic panel  Result Value Ref Range   Glucose 116 (H) 70 - 99 mg/dL   BUN 9 6 - 24 mg/dL   Creatinine, Ser 9.14 0.76 - 1.27 mg/dL   eGFR 86 >78  GN/FAO/1.30   BUN/Creatinine Ratio 9 9 - 20   Sodium 139 134 - 144 mmol/L   Potassium 4.1 3.5 - 5.2 mmol/L   Chloride 101 96 - 106 mmol/L   CO2 20 20 - 29 mmol/L   Calcium 9.5 8.7 - 10.2 mg/dL   Total Protein 7.3 6.0 - 8.5 g/dL   Albumin 4.3 3.8 - 4.9 g/dL   Globulin, Total 3.0 1.5 - 4.5 g/dL   Albumin/Globulin Ratio 1.4 1.2 - 2.2   Bilirubin Total <0.2 0.0 - 1.2 mg/dL   Alkaline Phosphatase 91 44 - 121 IU/L   AST 26 0 - 40 IU/L   ALT 35 0 - 44 IU/L  Lipid Panel w/o Chol/HDL Ratio  Result Value Ref Range   Cholesterol, Total 152 100 - 199 mg/dL   Triglycerides 865 (H) 0 - 149 mg/dL   HDL 43 >78 mg/dL   VLDL Cholesterol Cal 28 5 - 40 mg/dL   LDL Chol Calc (NIH) 81 0 - 99 mg/dL  TSH  Result Value Ref Range   TSH 2.370 0.450 - 4.500 uIU/mL  PSA  Result Value Ref Range   Prostate Specific Ag, Serum 0.5 0.0 - 4.0 ng/mL  VITAMIN D 25 Hydroxy (Vit-D Deficiency, Fractures)  Result Value Ref Range   Vit D, 25-Hydroxy 46.7 30.0 - 100.0 ng/mL      Assessment & Plan:   Problem List Items Addressed This Visit       Cardiovascular and Mediastinum   Hypertension associated with type 2 diabetes mellitus (HCC)    Chronic, stable. BP at goal in office today.  Recommend he monitor BP at least a few mornings a week at home and document.  DASH diet at home.  Continue current medication regimen and adjust as needed.  Labs today: CMP and lipid.  Return in October for physical.       Relevant Medications   amLODipine (NORVASC) 10 MG tablet   atorvastatin (LIPITOR) 20 MG tablet   lisinopril-hydrochlorothiazide (ZESTORETIC) 20-12.5 MG tablet   Other Relevant Orders   Bayer DCA Hb A1c Waived   Microalbumin, Urine Waived   Comprehensive metabolic panel     Respiratory   Centrilobular emphysema (HCC)    Chronic, stable.  Noted on CT imaging, lung cancer screening.  FEV1 99% and FEV1/FVC 95% on 11/23/20.  No current inhalers.  Recommend complete cessation of smoking and initiate inhalers as  needed.  Spirometry next visit.      Obstructive sleep apnea syndrome    Consider sleep study in future, at this time wishes to wait.        Endocrine   Hyperlipidemia associated with type 2 diabetes mellitus (HCC)    Chronic, ongoing.  Continue current medication regimen and adjust as needed.  Lipid panel today.      Relevant Medications   amLODipine (NORVASC) 10 MG tablet   atorvastatin (LIPITOR) 20 MG tablet   lisinopril-hydrochlorothiazide (ZESTORETIC) 20-12.5 MG tablet   Other Relevant Orders   Bayer DCA Hb A1c Waived   Comprehensive metabolic panel   Lipid Panel w/o Chol/HDL Ratio   Type 2 diabetes mellitus with obesity (HCC) - Primary    Chronic, stable with A1c 6.4% remaining off Metformin and maintaining diet + weight & urine ALB 22 November 2021 -- recheck today.  At this time wishes to maintain off medication, this is appropriate and recommend continue focus on diet and regular exercise -- his daughter is a huge support with this.  Will continue off medication at this time and continue diet focus.  Return in October for physical.      Relevant Medications   atorvastatin (LIPITOR) 20 MG tablet   lisinopril-hydrochlorothiazide (ZESTORETIC) 20-12.5 MG tablet   Other Relevant Orders   Bayer DCA Hb A1c Waived   Microalbumin, Urine Waived     Other   Cigarette nicotine dependence without complication    I have recommended complete cessation of tobacco use. I have discussed various options available for assistance with tobacco cessation including over the counter methods (Nicotine gum, patch and lozenges). We also discussed prescription options (Chantix, Nicotine Inhaler / Nasal Spray). The patient is not interested in pursuing any prescription tobacco cessation options at this time.       Depression    Chronic, stable.  Denies SI/HI.  Continue current medication regimen and adjust as needed.        Relevant Medications   FLUoxetine (PROZAC) 20 MG capsule   Obesity     BMI 43.94.  Has lost 8 pounds.  Recommended eating smaller high protein, low fat meals more frequently and exercising 30 mins a day 5 times a week with a goal of 10-15lb weight loss in the next 3 months. Patient voiced their understanding and motivation to adhere to these recommendations.         Follow up plan: Return in about 4 months (around 07/06/2023) for Annual physical due after 07/01/23 -- spirometry needed.

## 2023-03-03 NOTE — Assessment & Plan Note (Signed)
Chronic, ongoing.  Continue current medication regimen and adjust as needed. Lipid panel today. 

## 2023-03-03 NOTE — Assessment & Plan Note (Signed)
Consider sleep study in future, at this time wishes to wait. 

## 2023-03-04 LAB — COMPREHENSIVE METABOLIC PANEL
ALT: 27 IU/L (ref 0–44)
AST: 24 IU/L (ref 0–40)
Albumin: 4.2 g/dL (ref 3.8–4.9)
Alkaline Phosphatase: 76 IU/L (ref 44–121)
BUN/Creatinine Ratio: 13 (ref 9–20)
BUN: 14 mg/dL (ref 6–24)
Bilirubin Total: <0.2 mg/dL (ref 0.0–1.2)
CO2: 23 mmol/L (ref 20–29)
Calcium: 9.7 mg/dL (ref 8.7–10.2)
Chloride: 101 mmol/L (ref 96–106)
Creatinine, Ser: 1.06 mg/dL (ref 0.76–1.27)
Globulin, Total: 2.8 g/dL (ref 1.5–4.5)
Glucose: 88 mg/dL (ref 70–99)
Potassium: 4.3 mmol/L (ref 3.5–5.2)
Sodium: 138 mmol/L (ref 134–144)
Total Protein: 7 g/dL (ref 6.0–8.5)
eGFR: 84 mL/min/{1.73_m2} (ref >59)

## 2023-03-04 LAB — LIPID PANEL W/O CHOL/HDL RATIO
Cholesterol, Total: 120 mg/dL (ref 100–199)
HDL: 36 mg/dL — ABNORMAL LOW (ref >39)
LDL Chol Calc (NIH): 60 mg/dL (ref 0–99)
Triglycerides: 135 mg/dL (ref 0–149)
VLDL Cholesterol Cal: 24 mg/dL (ref 5–40)

## 2023-03-04 LAB — MICROALBUMIN, URINE WAIVED
Creatinine, Urine Waived: 10 mg/dL (ref 10–300)
Microalb, Ur Waived: 10 mg/L (ref 0–19)
Microalb/Creat Ratio: <30 mg/g (ref <30)

## 2023-03-04 LAB — BAYER DCA HB A1C WAIVED: HB A1C (BAYER DCA - WAIVED): 6.4 % — ABNORMAL HIGH (ref 4.8–5.6)

## 2023-03-04 NOTE — Progress Notes (Signed)
Contacted via MyChart   Good morning Melville, your labs have returned and overall continue to look great.  No medication changes needed.  Any questions? Keep being amazing!!  Thank you for allowing me to participate in your care.  I appreciate you. Kindest regards, Camelle Henkels

## 2023-05-23 ENCOUNTER — Other Ambulatory Visit: Payer: Self-pay | Admitting: Nurse Practitioner

## 2023-05-25 NOTE — Telephone Encounter (Signed)
Requested Prescriptions  Pending Prescriptions Disp Refills   cetirizine (ZYRTEC) 10 MG tablet [Pharmacy Med Name: CETIRIZINE 10MG  TABLETS] 90 tablet 2    Sig: TAKE 1 TABLET(10 MG) BY MOUTH DAILY     Ear, Nose, and Throat:  Antihistamines 2 Passed - 05/23/2023  6:20 AM      Passed - Cr in normal range and within 360 days    Creatinine, Ser  Date Value Ref Range Status  03/03/2023 1.06 0.76 - 1.27 mg/dL Final         Passed - Valid encounter within last 12 months    Recent Outpatient Visits           2 months ago Type 2 diabetes mellitus with obesity (HCC)   [facility] [place], [place] T, NP   9 months ago Cervical radiculopathy   [facility] [place], [place] T, NP   10 months ago Type 2 diabetes mellitus with obesity (HCC)   [facility] [place], Corrie Dandy T, NP   11 months ago Viral upper respiratory tract infection   [facility] [place], Erin E, PA-C   1 year ago Type 2 diabetes mellitus with obesity (HCC)   [facility] [place], Dorie Rank, NP       Future Appointments             In 1 month [place], Dorie Rank, NP  [facility]

## 2023-06-28 NOTE — Patient Instructions (Signed)
Be Involved in Caring For Your Health:  Taking Medications When medications are taken as directed, they can greatly improve your health. But if they are not taken as prescribed, they may not work. In some cases, not taking them correctly can be harmful. To help ensure your treatment remains effective and safe, understand your medications and how to take them. Bring your medications to each visit for review by your provider.  Your lab results, notes, and after visit summary will be available on My Chart. We strongly encourage you to use this feature. If lab results are abnormal the clinic will contact you with the appropriate steps. If the clinic does not contact you assume the results are satisfactory. You can always view your results on My Chart. If you have questions regarding your health or results, please contact the clinic during office hours. You can also ask questions on My Chart.  We at [facility] are grateful that you chose [place] to provide your care. We strive to provide evidence-based and compassionate care and are always looking for feedback. If you get a survey from the clinic please complete this so we can hear your opinions.  Diabetes Mellitus and Nutrition, Adult When you have diabetes, or diabetes mellitus, it is very important to have healthy eating habits because your blood sugar (glucose) levels are greatly affected by what you eat and drink. Eating healthy foods in the right amounts, at about the same times every day, can help you: Manage your blood glucose. Lower your risk of heart disease. Improve your blood pressure. Reach or maintain a healthy weight. What can affect my meal plan? Every person with diabetes is different, and each person has different needs for a meal plan. Your health care provider may recommend that you work with a dietitian to make a meal plan that is best for you. Your meal plan may vary depending on factors such as: The calories you need. The  medicines you take. Your weight. Your blood glucose, blood pressure, and cholesterol levels. Your activity level. Other health conditions you have, such as heart or kidney disease. How do carbohydrates affect me? Carbohydrates, also called carbs, affect your blood glucose level more than any other type of food. Eating carbs raises the amount of glucose in your blood. It is important to know how many carbs you can safely have in each meal. This is different for every person. Your dietitian can help you calculate how many carbs you should have at each meal and for each snack. How does alcohol affect me? Alcohol can cause a decrease in blood glucose (hypoglycemia), especially if you use insulin or take certain diabetes medicines by mouth. Hypoglycemia can be a life-threatening condition. Symptoms of hypoglycemia, such as sleepiness, dizziness, and confusion, are similar to symptoms of having too much alcohol. Do not drink alcohol if: Your health care provider tells you not to drink. You are pregnant, may be pregnant, or are planning to become pregnant. If you drink alcohol: Limit how much you have to: 0-1 drink a day for women. 0-2 drinks a day for men. Know how much alcohol is in your drink. In the [place] one drink equals one 12 oz bottle of beer (355 mL), one 5 oz glass of wine (148 mL), or one 1 oz glass of hard liquor (44 mL). Keep yourself hydrated with water, diet soda, or unsweetened iced tea. Keep in mind that regular soda, juice, and other mixers may contain a lot of sugar and must  be counted as carbs. What are tips for following this plan?  Reading food labels Start by checking the serving size on the Nutrition Facts label of packaged foods and drinks. The number of calories and the amount of carbs, fats, and other nutrients listed on the label are based on one serving of the item. Many items contain more than one serving per package. Check the total grams (g) of carbs in one  serving. Check the number of grams of saturated fats and trans fats in one serving. Choose foods that have a low amount or none of these fats. Check the number of milligrams (mg) of salt (sodium) in one serving. Most people should limit total sodium intake to less than 2,300 mg per day. Always check the nutrition information of foods labeled as "low-fat" or "nonfat." These foods may be higher in added sugar or refined carbs and should be avoided. Talk to your dietitian to identify your daily goals for nutrients listed on the label. Shopping Avoid buying canned, pre-made, or processed foods. These foods tend to be high in fat, sodium, and added sugar. Shop around the outside edge of the grocery store. This is where you will most often find fresh fruits and vegetables, bulk grains, fresh meats, and fresh dairy products. Cooking Use low-heat cooking methods, such as baking, instead of high-heat cooking methods, such as deep frying. Cook using healthy oils, such as olive, canola, or sunflower oil. Avoid cooking with butter, cream, or high-fat meats. Meal planning Eat meals and snacks regularly, preferably at the same times every day. Avoid going long periods of time without eating. Eat foods that are high in fiber, such as fresh fruits, vegetables, beans, and whole grains. Eat 4-6 oz (112-168 g) of lean protein each day, such as lean meat, chicken, fish, eggs, or tofu. One ounce (oz) (28 g) of lean protein is equal to: 1 oz (28 g) of meat, chicken, or fish. 1 egg.  cup (62 g) of tofu. Eat some foods each day that contain healthy fats, such as avocado, nuts, seeds, and fish. What foods should I eat? Fruits Berries. Apples. Oranges. Peaches. Apricots. Plums. Grapes. Mangoes. Papayas. Pomegranates. Kiwi. Cherries. Vegetables Leafy greens, including lettuce, spinach, kale, chard, collard greens, mustard greens, and cabbage. Beets. Cauliflower. Broccoli. Carrots. Green beans. Tomatoes. Peppers.  Onions. Cucumbers. Brussels sprouts. Grains Whole grains, such as whole-wheat or whole-grain bread, crackers, tortillas, cereal, and pasta. Unsweetened oatmeal. Quinoa. Brown or wild rice. Meats and other proteins Seafood. Poultry without skin. Lean cuts of poultry and beef. Tofu. Nuts. Seeds. Dairy Low-fat or fat-free dairy products such as milk, yogurt, and cheese. The items listed above may not be a complete list of foods and beverages you can eat and drink. Contact a dietitian for more information. What foods should I avoid? Fruits Fruits canned with syrup. Vegetables Canned vegetables. Frozen vegetables with butter or cream sauce. Grains Refined white flour and flour products such as bread, pasta, snack foods, and cereals. Avoid all processed foods. Meats and other proteins Fatty cuts of meat. Poultry with skin. Breaded or fried meats. Processed meat. Avoid saturated fats. Dairy Full-fat yogurt, cheese, or milk. Beverages Sweetened drinks, such as soda or iced tea. The items listed above may not be a complete list of foods and beverages you should avoid. Contact a dietitian for more information. Questions to ask a health care provider Do I need to meet with a certified diabetes care and education specialist? Do I need to meet with a  dietitian? What number can I call if I have questions? When are the best times to check my blood glucose? Where to find more information: American Diabetes Association: diabetes.org Academy of Nutrition and Dietetics: eatright.[employer] of Diabetes and Digestive and Kidney Diseases: StageSync.si Association of Diabetes Care & Education Specialists: diabeteseducator.org Summary It is important to have healthy eating habits because your blood sugar (glucose) levels are greatly affected by what you eat and drink. It is important to use alcohol carefully. A healthy meal plan will help you manage your blood glucose and lower your risk of  heart disease. Your health care provider may recommend that you work with a dietitian to make a meal plan that is best for you. This information is not intended to replace advice given to you by your health care provider. Make sure you discuss any questions you have with your health care provider. Document Revised: 04/04/2020 Document Reviewed: 04/04/2020 Elsevier Patient Education  2024 [employer].

## 2023-07-03 ENCOUNTER — Encounter: Payer: Self-pay | Admitting: Nurse Practitioner

## 2023-07-03 ENCOUNTER — Ambulatory Visit (INDEPENDENT_AMBULATORY_CARE_PROVIDER_SITE_OTHER): Payer: Managed Care, Other (non HMO) | Admitting: Nurse Practitioner

## 2023-07-03 VITALS — BP 113/69 | HR 67 | Temp 98.6°F | Ht 63.0 in | Wt 252.2 lb

## 2023-07-03 DIAGNOSIS — J432 Centrilobular emphysema: Secondary | ICD-10-CM

## 2023-07-03 DIAGNOSIS — F325 Major depressive disorder, single episode, in full remission: Secondary | ICD-10-CM

## 2023-07-03 DIAGNOSIS — E1169 Type 2 diabetes mellitus with other specified complication: Secondary | ICD-10-CM

## 2023-07-03 DIAGNOSIS — I152 Hypertension secondary to endocrine disorders: Secondary | ICD-10-CM

## 2023-07-03 DIAGNOSIS — Z23 Encounter for immunization: Secondary | ICD-10-CM

## 2023-07-03 DIAGNOSIS — E66813 Obesity, class 3: Secondary | ICD-10-CM

## 2023-07-03 DIAGNOSIS — N400 Enlarged prostate without lower urinary tract symptoms: Secondary | ICD-10-CM

## 2023-07-03 DIAGNOSIS — Z0000 Encounter for general adult medical examination without abnormal findings: Secondary | ICD-10-CM

## 2023-07-03 DIAGNOSIS — E1159 Type 2 diabetes mellitus with other circulatory complications: Secondary | ICD-10-CM | POA: Diagnosis not present

## 2023-07-03 DIAGNOSIS — F17210 Nicotine dependence, cigarettes, uncomplicated: Secondary | ICD-10-CM

## 2023-07-03 DIAGNOSIS — E559 Vitamin D deficiency, unspecified: Secondary | ICD-10-CM

## 2023-07-03 DIAGNOSIS — L409 Psoriasis, unspecified: Secondary | ICD-10-CM

## 2023-07-03 DIAGNOSIS — Z6841 Body Mass Index (BMI) 40.0 and over, adult: Secondary | ICD-10-CM

## 2023-07-03 DIAGNOSIS — G4733 Obstructive sleep apnea (adult) (pediatric): Secondary | ICD-10-CM

## 2023-07-03 DIAGNOSIS — E669 Obesity, unspecified: Secondary | ICD-10-CM

## 2023-07-03 MED ORDER — BENZONATATE 100 MG PO CAPS: 100.0000 mg | ORAL_CAPSULE | Freq: Three times a day (TID) | ORAL | 5 refills | Status: DC | PRN

## 2023-07-03 MED ORDER — MELOXICAM 7.5 MG PO TABS: 7.5000 mg | ORAL_TABLET | Freq: Every day | ORAL | 4 refills | Status: AC

## 2023-07-03 NOTE — Assessment & Plan Note (Signed)
BMI 44.68.  Recommended eating smaller high protein, low fat meals more frequently and exercising 30 mins a day 5 times a week with a goal of 10-15lb weight loss in the next 3 months. Patient voiced their understanding and motivation to adhere to these recommendations.

## 2023-07-03 NOTE — Assessment & Plan Note (Signed)
Followed by dermatology, continue this collaboration and medication as ordered by them. 

## 2023-07-03 NOTE — Assessment & Plan Note (Signed)
Chronic, stable. BP at goal in office today.  Recommend he monitor BP at least a few mornings a week at home and document.  DASH diet at home.  Continue current medication regimen and adjust as needed.  Labs today: CBC, CMP, TSH.  Refills as needed.

## 2023-07-03 NOTE — Assessment & Plan Note (Signed)
Chronic, stable.  Denies SI/HI.  Continue current medication regimen and adjust as needed.

## 2023-07-03 NOTE — Assessment & Plan Note (Signed)
Consider sleep study in future, at this time wishes to wait. 

## 2023-07-03 NOTE — Assessment & Plan Note (Signed)
Chronic, stable with A1c 6.4% last visit remaining off Metformin and maintaining diet + weight & urine ALB 23 November 2022.  Recheck A1c today.  At this time wishes to maintain off medication, this is appropriate and recommend continue focus on diet and regular exercise -- his daughter is a huge support with this.  Will restart medication as needed. - Vaccinations up to date - Eye exam and foot up to date - ACE and statin on board

## 2023-07-03 NOTE — Assessment & Plan Note (Signed)
Chronic, stable.  Noted on CT imaging, lung cancer screening.  FEV1 99% and FEV1/FVC 95% on 11/23/20.  No current inhalers.  Recommend complete cessation of smoking and initiate inhalers as needed.  Spirometry next visit.

## 2023-07-03 NOTE — Assessment & Plan Note (Signed)
Chronic, ongoing.  Continue current medication regimen and adjust as needed. Lipid panel today. 

## 2023-07-03 NOTE — Assessment & Plan Note (Signed)
I have recommended complete cessation of tobacco use. I have discussed various options available for assistance with tobacco cessation including over the counter methods (Nicotine gum, patch and lozenges). We also discussed prescription options (Chantix, Nicotine Inhaler / Nasal Spray). The patient is not interested in pursuing any prescription tobacco cessation options at this time.  

## 2023-07-03 NOTE — Progress Notes (Signed)
BP 113/69   Pulse 67   Temp 98.6 F (37 C) (Oral)   Ht 5\' 3"  (1.6 m)   Wt 252 lb 3.2 oz (114.4 kg)   SpO2 98%   BMI 44.68 kg/m    Subjective:    Patient ID: Lance Kim, male    DOB: 01/01/1971, 52 y.o.   MRN: [id number]  HPI: Lance Kim is a 52 y.o. male presenting on 07/03/2023 for comprehensive medical examination. Current medical complaints include:none   He currently lives with: daughter Interim Problems from his last visit: no  DIABETES A1c in June was 6.4%.  Remains off medication at this time. Had significant weight loss over 2 years ago and is trying to maintain.  Never attended sleep study in past.  Takes Gabapentin only as needed + Meloxicam. Hypoglycemic episodes:no Polydipsia/polyuria: no Visual disturbance: no Chest pain: no Paresthesias: no Glucose Monitoring: no             Accucheck frequency: not checking             Fasting glucose:              Post prandial:             Evening:              Before meals: Taking Insulin?: no             Long acting insulin:             Short acting insulin: Blood Pressure Monitoring: not checking Retinal Examination: Up To Date -- [facility] Foot Exam: Up to Date Pneumovax: Up to Date Influenza: Up to Date  Aspirin: yes    HYPERTENSION / HYPERLIPIDEMIA Continues on Lisinopril-HCTZ, Amlodipine, and Atorvastatin, ASA.    Is followed by dermatology for psoriasis, last saw earlier this month.   Satisfied with current treatment? yes Duration of hypertension: chronic BP monitoring frequency: not checking BP range:  BP medication side effects: no Duration of hyperlipidemia: chronic Cholesterol medication side effects: no Cholesterol supplements: none Medication compliance: good compliance Aspirin: yes Recent stressors: no Recurrent headaches: no Visual changes: no Palpitations: no Dyspnea: no Chest pain: no Lower extremity edema: occasional Dizzy/lightheaded: no  The ASCVD Risk score (Arnett  DK, et al., 2019) failed to calculate for the following reasons:   The valid total cholesterol range is 130 to 320 mg/dL  COPD Goes for lung screening, last 08/04/22.  Notes emphysema and bilateral gynecomastia. Requests refills on Tessalon, which he occasionally uses.  Continues to smoke daily -- smoking 1/2 PPD, started smoking at age 62. COPD status: stable Satisfied with current treatment?: yes Oxygen use: no Dyspnea frequency: none Cough frequency: none Rescue inhaler frequency:  none Limitation of activity: no Productive cough: none Last Spirometry: today Pneumovax: Up to Date Influenza: Up To Date   DEPRESSION Continues on Prozac 20 MG daily.  He reports being stable with this dosing. Mood status: stable Satisfied with current treatment?: yes Symptom severity: mild  Duration of current treatment : chronic Side effects: no Medication compliance: good compliance Psychotherapy/counseling: none Depressed mood: no Anxious mood: no Anhedonia: no Significant weight loss or gain: no Insomnia: no Fatigue: no Feelings of worthlessness or guilt: no Impaired concentration/indecisiveness: no Suicidal ideations: no Hopelessness: no Crying spells: no    07/03/2023    3:48 PM 03/03/2023    4:31 PM 08/01/2022    4:40 PM 06/30/2022    4:09 PM 06/16/2022    2:55 PM  Depression screen PHQ 2/9  Decreased Interest 1 1 0 0 0  Down, Depressed, Hopeless 1 1 1  0 0  PHQ - 2 Score 2 2 1  0 0  Altered sleeping 1 1 0 0 0  Tired, decreased energy 1 1 1  0 0  Change in appetite 1 1 0 0 0  Feeling bad or failure about yourself  1 1 0 0 0  Trouble concentrating 1 1 0 0 0  Moving slowly or fidgety/restless 0 0 0 0 0  Suicidal thoughts 0 0 0 0 0  PHQ-9 Score 7 7 2  0 0  Difficult doing work/chores Somewhat difficult Not difficult at all Not difficult at all  Not difficult at all       07/03/2023    3:49 PM 03/03/2023    4:32 PM 08/01/2022    4:40 PM 06/16/2022    2:56 PM  GAD 7 :  Generalized Anxiety Score  Nervous, Anxious, on Edge 1 1 1  0  Control/stop worrying 1 1  0  Worry too much - different things 1 1 1  0  Trouble relaxing 1 1 1  0  Restless 0 0 0 0  Easily annoyed or irritable 1 1 1  0  Afraid - awful might happen 1 1 1  0  Total GAD 7 Score 6 6  0  Anxiety Difficulty Somewhat difficult Not difficult at all Not difficult at all Not difficult at all   Functional Status Survey: Is the patient deaf or have difficulty hearing?: No Does the patient have difficulty seeing, even when wearing glasses/contacts?: No Does the patient have difficulty concentrating, remembering, or making decisions?: No Does the patient have difficulty walking or climbing stairs?: No Does the patient have difficulty dressing or bathing?: No Does the patient have difficulty doing errands alone such as visiting a doctor's office or shopping?: No  FALL RISK:    07/03/2023    3:48 PM 08/01/2022    4:39 PM 06/30/2022    4:09 PM 06/16/2022    2:55 PM 11/29/2021    4:07 PM  Fall Risk   Falls in the past year? 0 0 0 0 0  Number falls in past yr: 0 0 0 0 0  Injury with Fall? 0 0 0 0 0  Risk for fall due to : No Fall Risks No Fall Risks No Fall Risks No Fall Risks No Fall Risks  Follow up Falls evaluation completed Falls evaluation completed Falls evaluation completed Falls evaluation completed Falls evaluation completed   Past Medical History:  Past Medical History:  Diagnosis Date   Anxiety    Depression    Diabetes mellitus without complication (HCC)    Hyperlipidemia    Hypertension    Hypertensive renal disease without failure    Tobacco use disorder    Surgical History:  Past Surgical History:  Procedure Laterality Date   COLONOSCOPY WITH PROPOFOL N/A 09/02/2019   Procedure: COLONOSCOPY WITH PROPOFOL;  Surgeon: Wyline Mood, MD;  Location: [facility] ENDOSCOPY;  Service: Gastroenterology;  Laterality: N/A;   WISDOM TOOTH EXTRACTION      Medications:  Current Outpatient  Medications on File Prior to Visit  Medication Sig   amLODipine (NORVASC) 10 MG tablet TAKE 1 TABLET(10 MG) BY MOUTH DAILY   Apremilast 30 MG TABS Take 1 tablet by mouth 2 (two) times daily.   aspirin EC 81 MG tablet Take 81 mg by mouth daily.   atorvastatin (LIPITOR) 20 MG tablet TAKE 1 TABLET(20 MG) BY MOUTH DAILY  cetirizine (ZYRTEC) 10 MG tablet TAKE 1 TABLET(10 MG) BY MOUTH DAILY   Cholecalciferol (VITAMIN D3) 1.25 MG (50000 UT) CAPS TAKE 1 CAPSULE BY MOUTH ONCE A WEEK   CONTOUR NEXT TEST test strip USE TO TEST SUGAR TWICE DAILY   desoximetasone (TOPICORT) 0.25 % cream Apply topically.   FIBER ADULT GUMMIES PO Take 2 tablets by mouth daily.    FLUoxetine (PROZAC) 20 MG capsule TAKE 1 CAPSULE(20 MG) BY MOUTH DAILY   fluticasone (FLONASE) 50 MCG/ACT nasal spray SHAKE LIQUID AND USE 2 SPRAYS IN EACH NOSTRIL TWICE DAILY   gabapentin (NEURONTIN) 600 MG tablet Take 0.5 tablets (300 mg total) by mouth at bedtime.   lisinopril-hydrochlorothiazide (ZESTORETIC) 20-12.5 MG tablet Take 1 tablet by mouth daily.   methocarbamol (ROBAXIN) 500 MG tablet Take 1 tablet (500 mg total) by mouth every 8 (eight) hours as needed for muscle spasms.   Microlet Lancets MISC USE TO TEST SUGAR TWICE DAILY   mometasone (ELOCON) 0.1 % lotion Apply 1 Application topically daily.   Multiple Vitamin (MULTIVITAMIN) tablet Take 2 tablets by mouth daily.    ondansetron (ZOFRAN-ODT) 4 MG disintegrating tablet DISSOLVE 1 TABLET ON THE TONGUE EVERY 8 HOURS AS NEEDED FOR NAUSEA   Probiotic Product (PROBIOTIC PO) Take 1 tablet by mouth daily at 2 PM.   triamcinolone cream (KENALOG) 0.1 % Apply 1 application topically 2 (two) times daily.   No current facility-administered medications on file prior to visit.    Allergies:  Allergies  Allergen Reactions   Mevacor [Lovastatin] Other (See Comments)    CRAMPS    Social History:  Social History   Socioeconomic History   Marital status: Legally Separated    Spouse name:  Not on file   Number of children: Not on file   Years of education: Not on file   Highest education level: Not on file  Occupational History   Not on file  Tobacco Use   Smoking status: Every Day    Current packs/day: 0.75    Average packs/day: 0.8 packs/day for 32.0 years (24.0 ttl pk-yrs)    Types: Cigarettes   Smokeless tobacco: Never   Tobacco comments:    .5ppd now  Vaping Use   Vaping status: Never Used  Substance and Sexual Activity   Alcohol use: Yes    Alcohol/week: 2.0 standard drinks of alcohol    Types: 1 Glasses of wine, 1 Cans of beer per week    Comment: on occasion   Drug use: No   Sexual activity: Yes    Partners: Female    Comment: satidfied with sexual function  Other Topics Concern   Not on file  Social History Narrative   Not on file   Social Determinants of Health   Financial Resource Strain: Low Risk  (05/28/2021)   Overall Financial Resource Strain (CARDIA)    Difficulty of Paying Living Expenses: Not very hard  Food Insecurity: No Food Insecurity (05/28/2021)   Hunger Vital Sign    Worried About Running Out of Food in the Last Year: Never true    Ran Out of Food in the Last Year: Never true  Transportation Needs: No Transportation Needs (05/28/2021)   PRAPARE - Administrator, Civil Service (Medical): No    Lack of Transportation (Non-Medical): No  Physical Activity: Inactive (05/28/2021)   Exercise Vital Sign    Days of Exercise per Week: 0 days    Minutes of Exercise per Session: 0 min  Stress: No Stress  Concern Present (05/28/2021)   [employer] of Occupational Health - Occupational Stress Questionnaire    Feeling of Stress : Not at all  Social Connections: Not on file  Intimate Partner Violence: Not At Risk (05/28/2021)   Humiliation, Afraid, Rape, and Kick questionnaire    Fear of Current or Ex-Partner: No    Emotionally Abused: No    Physically Abused: No    Sexually Abused: No   Social History   Tobacco Use   Smoking Status Every Day   Current packs/day: 0.75   Average packs/day: 0.8 packs/day for 32.0 years (24.0 ttl pk-yrs)   Types: Cigarettes  Smokeless Tobacco Never  Tobacco Comments   .5ppd now   Social History   Substance and Sexual Activity  Alcohol Use Yes   Alcohol/week: 2.0 standard drinks of alcohol   Types: 1 Glasses of wine, 1 Cans of beer per week   Comment: on occasion    Family History:  Family History  Problem Relation Age of Onset   Diabetes Mother    Heart Problems Mother    Emphysema Father    Cancer Son    Cancer Maternal Grandmother    Prostate cancer Neg Hx    Bladder Cancer Neg Hx    Kidney cancer Neg Hx     Past medical history, surgical history, medications, allergies, family history and social history reviewed with patient today and changes made to appropriate areas of the chart.   ROS  All other ROS negative except what is listed above and in the HPI.      Objective:    BP 113/69   Pulse 67   Temp 98.6 F (37 C) (Oral)   Ht 5\' 3"  (1.6 m)   Wt 252 lb 3.2 oz (114.4 kg)   SpO2 98%   BMI 44.68 kg/m   Wt Readings from Last 3 Encounters:  07/03/23 252 lb 3.2 oz (114.4 kg)  03/03/23 248 lb (112.5 kg)  08/01/22 256 lb 11.2 oz (116.4 kg)    Physical Exam Vitals and nursing note reviewed.  Constitutional:      General: He is awake. He is not in acute distress.    Appearance: He is well-developed and well-groomed. He is obese. He is not ill-appearing or toxic-appearing.  HENT:     Head: Normocephalic and atraumatic.     Right Ear: Hearing, tympanic membrane, ear canal and external ear normal. No drainage.     Left Ear: Hearing, tympanic membrane, ear canal and external ear normal. No drainage.     Nose: Nose normal.     Mouth/Throat:     Pharynx: Uvula midline.  Eyes:     General: Lids are normal.        Right eye: No discharge.        Left eye: No discharge.     Extraocular Movements: Extraocular movements intact.      Conjunctiva/sclera: Conjunctivae normal.     Pupils: Pupils are equal, round, and reactive to light.     Visual Fields: Right eye visual fields normal and left eye visual fields normal.  Neck:     Thyroid: No thyromegaly.     Vascular: No carotid bruit or JVD.     Trachea: Trachea normal.  Cardiovascular:     Rate and Rhythm: Normal rate and regular rhythm.     Heart sounds: Normal heart sounds, S1 normal and S2 normal. No murmur heard.    No gallop.  Pulmonary:     Effort:  Pulmonary effort is normal. No accessory muscle usage or respiratory distress.     Breath sounds: Normal breath sounds.  Abdominal:     General: Bowel sounds are normal.     Palpations: Abdomen is soft. There is no hepatomegaly or splenomegaly.     Tenderness: There is no abdominal tenderness.  Musculoskeletal:        General: Normal range of motion.     Cervical back: Normal range of motion and neck supple.     Right lower leg: No edema.     Left lower leg: No edema.  Lymphadenopathy:     Head:     Right side of head: No submental, submandibular, tonsillar, preauricular or posterior auricular adenopathy.     Left side of head: No submental, submandibular, tonsillar, preauricular or posterior auricular adenopathy.     Cervical: No cervical adenopathy.  Skin:    General: Skin is warm and dry.     Capillary Refill: Capillary refill takes less than 2 seconds.     Findings: No rash.  Neurological:     Mental Status: He is alert and oriented to person, place, and time.     Gait: Gait is intact.     Deep Tendon Reflexes: Reflexes are normal and symmetric.     Reflex Scores:      Brachioradialis reflexes are 2+ on the right side and 2+ on the left side.      Patellar reflexes are 2+ on the right side and 2+ on the left side. Psychiatric:        Attention and Perception: Attention normal.        Mood and Affect: Mood normal.        Speech: Speech normal.        Behavior: Behavior normal. Behavior is cooperative.         Thought Content: Thought content normal.        Cognition and Memory: Cognition normal.    Diabetic Foot Exam - Simple   Simple Foot Form Visual Inspection See comments: Yes Sensation Testing Intact to touch and monofilament testing bilaterally: Yes Pulse Check Posterior Tibialis and Dorsalis pulse intact bilaterally: Yes Comments Dry feet     Results for orders placed or performed in visit on 03/03/23  Bayer DCA Hb A1c Waived  Result Value Ref Range   HB A1C (BAYER DCA - WAIVED) 6.4 (H) 4.8 - 5.6 %  Microalbumin, Urine Waived  Result Value Ref Range   Microalb, Ur Waived 10 0 - 19 mg/L   Creatinine, Urine Waived 10 10 - 300 mg/dL   Microalb/Creat Ratio <30 <30 mg/g  Comprehensive metabolic panel  Result Value Ref Range   Glucose 88 70 - 99 mg/dL   BUN 14 6 - 24 mg/dL   Creatinine, Ser 1.61 0.76 - 1.27 mg/dL   eGFR 84 >09 UE/AVW/0.98   BUN/Creatinine Ratio 13 9 - 20   Sodium 138 134 - 144 mmol/L   Potassium 4.3 3.5 - 5.2 mmol/L   Chloride 101 96 - 106 mmol/L   CO2 23 20 - 29 mmol/L   Calcium 9.7 8.7 - 10.2 mg/dL   Total Protein 7.0 6.0 - 8.5 g/dL   Albumin 4.2 3.8 - 4.9 g/dL   Globulin, Total 2.8 1.5 - 4.5 g/dL   Bilirubin Total <1.1 0.0 - 1.2 mg/dL   Alkaline Phosphatase 76 44 - 121 IU/L   AST 24 0 - 40 IU/L   ALT 27 0 - 44 IU/L  Lipid Panel w/o Chol/HDL Ratio  Result Value Ref Range   Cholesterol, Total 120 100 - 199 mg/dL   Triglycerides 161 0 - 149 mg/dL   HDL 36 (L) >09 mg/dL   VLDL Cholesterol Cal 24 5 - 40 mg/dL   LDL Chol Calc (NIH) 60 0 - 99 mg/dL      Assessment & Plan:   Problem List Items Addressed This Visit       Cardiovascular and Mediastinum   Hypertension associated with type 2 diabetes mellitus (HCC)    Chronic, stable. BP at goal in office today.  Recommend he monitor BP at least a few mornings a week at home and document.  DASH diet at home.  Continue current medication regimen and adjust as needed.  Labs today: CBC, CMP, TSH.   Refills as needed.       Relevant Orders   HgB A1c   TSH   CBC with Differential/Platelet   VITAMIN D 25 Hydroxy (Vit-D Deficiency, Fractures)     Respiratory   Centrilobular emphysema (HCC)    Chronic, stable.  Noted on CT imaging, lung cancer screening.  FEV1 99% and FEV1/FVC 95% on 11/23/20.  No current inhalers.  Recommend complete cessation of smoking and initiate inhalers as needed.  Spirometry next visit.      Relevant Medications   benzonatate (TESSALON PERLES) 100 MG capsule   Obstructive sleep apnea syndrome    Consider sleep study in future, at this time wishes to wait.        Endocrine   Hyperlipidemia associated with type 2 diabetes mellitus (HCC)    Chronic, ongoing.  Continue current medication regimen and adjust as needed.  Lipid panel today.      Relevant Orders   HgB A1c   Comprehensive metabolic panel   Lipid Panel w/o Chol/HDL Ratio   CBC with Differential/Platelet   Type 2 diabetes mellitus with obesity (HCC) - Primary    Chronic, stable with A1c 6.4% last visit remaining off Metformin and maintaining diet + weight & urine ALB 23 November 2022.  Recheck A1c today.  At this time wishes to maintain off medication, this is appropriate and recommend continue focus on diet and regular exercise -- his daughter is a huge support with this.  Will restart medication as needed. - Vaccinations up to date - Eye exam and foot up to date - ACE and statin on board      Relevant Orders   HgB A1c   CBC with Differential/Platelet     Musculoskeletal and Integument   Psoriasis    Followed by dermatology, continue this collaboration and medication as ordered by them.        Other   Cigarette nicotine dependence without complication    I have recommended complete cessation of tobacco use. I have discussed various options available for assistance with tobacco cessation including over the counter methods (Nicotine gum, patch and lozenges). We also discussed prescription  options (Chantix, Nicotine Inhaler / Nasal Spray). The patient is not interested in pursuing any prescription tobacco cessation options at this time.       Depression    Chronic, stable.  Denies SI/HI.  Continue current medication regimen and adjust as needed.        Obesity    BMI 44.68.  Recommended eating smaller high protein, low fat meals more frequently and exercising 30 mins a day 5 times a week with a goal of 10-15lb weight loss in the next 3 months. Patient  voiced their understanding and motivation to adhere to these recommendations.       Vitamin D deficiency    Chronic. Taking weekly supplement.  Recheck level today and can change to daily supplement if levels improved = 2000 units daily.      Relevant Orders   VITAMIN D 25 Hydroxy (Vit-D Deficiency, Fractures)   Other Visit Diagnoses     Benign prostatic hyperplasia without lower urinary tract symptoms       PSA on labs today.   Relevant Orders   PSA   Flu vaccine need       Flu vaccine today, educated patient.   Relevant Orders   Flu vaccine trivalent PF, 6mos and older(Flulaval,Afluria,Fluarix,Fluzone) (Completed)   Encounter for annual physical exam       Annual physical today with labs and health maintenance reviewed, discussed with patient.       Discussed aspirin prophylaxis for myocardial infarction prevention and decision was it was not indicated  LABORATORY TESTING:  Health maintenance labs ordered today as discussed above.   The natural history of prostate cancer and ongoing controversy regarding screening and potential treatment outcomes of prostate cancer has been discussed with the patient. The meaning of a false positive PSA and a false negative PSA has been discussed. He indicates understanding of the limitations of this screening test and wishes to proceed with screening PSA testing.   IMMUNIZATIONS:   - Tdap: Tetanus vaccination status reviewed: last tetanus booster within 10 years. -  Influenza: Up To Date - Pneumovax: Up to date - Prevnar: Not applicable - Zostavax vaccine: Refused - Covid: has had 2-3 of these  SCREENING: - Colonoscopy: Up to date  Discussed with patient purpose of the colonoscopy is to detect colon cancer at curable precancerous or early stages   - AAA Screening: Not applicable  -Hearing Test: Not applicable  -Spirometry: Not applicable   PATIENT COUNSELING:    Sexuality: Discussed sexually transmitted diseases, partner selection, use of condoms, avoidance of unintended pregnancy  and contraceptive alternatives.   Advised to avoid cigarette smoking.  I discussed with the patient that most people either abstain from alcohol or drink within safe limits (<=14/week and <=4 drinks/occasion for males, <=7/weeks and <= 3 drinks/occasion for females) and that the risk for alcohol disorders and other health effects rises proportionally with the number of drinks per week and how often a drinker exceeds daily limits.  Discussed cessation/primary prevention of drug use and availability of treatment for abuse.   Diet: Encouraged to adjust caloric intake to maintain  or achieve ideal body weight, to reduce intake of dietary saturated fat and total fat, to limit sodium intake by avoiding high sodium foods and not adding table salt, and to maintain adequate dietary potassium and calcium preferably from fresh fruits, vegetables, and low-fat dairy products.    Stressed the importance of regular exercise  Injury prevention: Discussed safety belts, safety helmets, smoke detector, smoking near bedding or upholstery.   Dental health: Discussed importance of regular tooth brushing, flossing, and dental visits.   Follow up plan: NEXT PREVENTATIVE PHYSICAL DUE IN 1 YEAR. Return in about 6 months (around 01/01/2024) for T2DM, HTN/HLD, MOOD -- spirometry needed.

## 2023-07-03 NOTE — Assessment & Plan Note (Signed)
Chronic. Taking weekly supplement.  Recheck level today and can change to daily supplement if levels improved = 2000 units daily.

## 2023-07-04 LAB — CBC WITH DIFFERENTIAL/PLATELET
Basophils Absolute: 0.1 10*3/uL (ref 0.0–0.2)
Basos: 1 %
EOS (ABSOLUTE): 0.2 10*3/uL (ref 0.0–0.4)
Eos: 2 %
Hematocrit: 42.2 % (ref 37.5–51.0)
Hemoglobin: 14.3 g/dL (ref 13.0–17.7)
Immature Grans (Abs): 0 10*3/uL (ref 0.0–0.1)
Immature Granulocytes: 0 %
Lymphocytes Absolute: 2.9 10*3/uL (ref 0.7–3.1)
Lymphs: 40 %
MCH: 28.8 pg (ref 26.6–33.0)
MCHC: 33.9 g/dL (ref 31.5–35.7)
MCV: 85 fL (ref 79–97)
Monocytes Absolute: 0.7 10*3/uL (ref 0.1–0.9)
Monocytes: 10 %
Neutrophils Absolute: 3.5 10*3/uL (ref 1.4–7.0)
Neutrophils: 47 %
Platelets: 249 10*3/uL (ref 150–450)
RBC: 4.96 x10E6/uL (ref 4.14–5.80)
RDW: 13.6 % (ref 11.6–15.4)
WBC: 7.3 10*3/uL (ref 3.4–10.8)

## 2023-07-04 LAB — COMPREHENSIVE METABOLIC PANEL
ALT: 35 [IU]/L (ref 0–44)
AST: 26 [IU]/L (ref 0–40)
Albumin: 3.9 g/dL (ref 3.8–4.9)
Alkaline Phosphatase: 76 [IU]/L (ref 44–121)
BUN/Creatinine Ratio: 9 (ref 9–20)
BUN: 10 mg/dL (ref 6–24)
Bilirubin Total: 0.3 mg/dL (ref 0.0–1.2)
CO2: 23 mmol/L (ref 20–29)
Calcium: 9.3 mg/dL (ref 8.7–10.2)
Chloride: 102 mmol/L (ref 96–106)
Creatinine, Ser: 1.14 mg/dL (ref 0.76–1.27)
Globulin, Total: 2.9 g/dL (ref 1.5–4.5)
Glucose: 99 mg/dL (ref 70–99)
Potassium: 4.2 mmol/L (ref 3.5–5.2)
Sodium: 138 mmol/L (ref 134–144)
Total Protein: 6.8 g/dL (ref 6.0–8.5)
eGFR: 77 mL/min/{1.73_m2} (ref >59)

## 2023-07-04 LAB — PSA: Prostate Specific Ag, Serum: 0.5 ng/mL (ref 0.0–4.0)

## 2023-07-04 LAB — VITAMIN D 25 HYDROXY (VIT D DEFICIENCY, FRACTURES): Vit D, 25-Hydroxy: 74.4 ng/mL (ref 30.0–100.0)

## 2023-07-04 LAB — TSH: TSH: 0.855 u[IU]/mL (ref 0.450–4.500)

## 2023-07-04 LAB — LIPID PANEL W/O CHOL/HDL RATIO
Cholesterol, Total: 125 mg/dL (ref 100–199)
HDL: 35 mg/dL — ABNORMAL LOW (ref >39)
LDL Chol Calc (NIH): 62 mg/dL (ref 0–99)
Triglycerides: 166 mg/dL — ABNORMAL HIGH (ref 0–149)
VLDL Cholesterol Cal: 28 mg/dL (ref 5–40)

## 2023-07-04 LAB — HEMOGLOBIN A1C
Est. average glucose Bld gHb Est-mCnc: 140 mg/dL
Hgb A1c MFr Bld: 6.5 % — ABNORMAL HIGH (ref 4.8–5.6)

## 2023-07-04 NOTE — Progress Notes (Signed)
Contacted via MyChart   Good morning Obbie, your labs have returned: - A1c remains stable at 6.5%, previous was 6.4%.  Great job!! - Lipid panel shows stable LDL, but triglycerides a little elevated.  Continue medications and focus on healthy diet. - Kidney function, creatinine and eGFR, remains normal, as is liver function, AST and ALT.  - Remainder of labs are stable.  Any questions? Keep being excellent!!  Thank you for allowing me to participate in your care.  I appreciate you. Kindest regards, Jazmyne Beauchesne

## 2023-07-13 ENCOUNTER — Encounter: Payer: Self-pay | Admitting: *Deleted

## 2023-07-22 ENCOUNTER — Ambulatory Visit: Payer: Managed Care, Other (non HMO) | Admitting: Pediatrics

## 2023-07-22 ENCOUNTER — Encounter: Payer: Self-pay | Admitting: Pediatrics

## 2023-07-22 VITALS — BP 100/70 | HR 65 | Wt 252.8 lb

## 2023-07-22 DIAGNOSIS — H539 Unspecified visual disturbance: Secondary | ICD-10-CM | POA: Diagnosis not present

## 2023-07-22 DIAGNOSIS — H5713 Ocular pain, bilateral: Secondary | ICD-10-CM

## 2023-07-22 DIAGNOSIS — K5900 Constipation, unspecified: Secondary | ICD-10-CM

## 2023-07-22 MED ORDER — NAPHAZOLINE-PHENIRAMINE 0.025-0.3 % OP SOLN: 1.0000 [drp] | Freq: Four times a day (QID) | OPHTHALMIC | 0 refills | Status: DC | PRN

## 2023-07-22 NOTE — Patient Instructions (Addendum)
For daily prevention of constipation: Take Miralax once or twice daily (1 capful in water by mouth daily). Or take daily senna (2 tablets by mouth once or twice a day (maximum dose is 4 tablets twice a day).  Some people may prefer one over the other.  You can take both if you need to.  For eye drops: - I sent some over to pharmacy - the lubricating eye drops are over the counter Use both

## 2023-07-22 NOTE — Progress Notes (Unsigned)
Office Visit  BP 100/70   Pulse 65   Wt 252 lb 12.8 oz (114.7 kg)   SpO2 99%   BMI 44.78 kg/m    Subjective:    Patient ID: Lance Kim, male    DOB: 12/13/1970, 52 y.o.   MRN: [id number]  HPI: Lance Kim is a 52 y.o. male  Chief Complaint  Patient presents with  . Eye Problem    Patient says he is having R eye discomfort. Patient says when he woke up Monday morning, his R eye was swollen. Patient says he thinks both are puffy, but the right is more puffy. Patient says he only change he had recently has been probiotic and fiber. Patient says he does have some tenderness and declines having any drainage from the eyes.  . Constipation    Discussed the use of AI scribe software for clinical note transcription with the patient, who gave verbal consent to proceed.  History of Present Illness            Relevant past medical, surgical, family and social history reviewed and updated as indicated. Interim medical history since our last visit reviewed. Allergies and medications reviewed and updated.  ROS per HPI unless specifically indicated above     Objective:    BP 100/70   Pulse 65   Wt 252 lb 12.8 oz (114.7 kg)   SpO2 99%   BMI 44.78 kg/m   Wt Readings from Last 3 Encounters:  07/22/23 252 lb 12.8 oz (114.7 kg)  07/03/23 252 lb 3.2 oz (114.4 kg)  03/03/23 248 lb (112.5 kg)     Physical Exam      07/03/2023    3:48 PM 03/03/2023    4:31 PM 08/01/2022    4:40 PM 06/30/2022    4:09 PM 06/16/2022    2:55 PM  Depression screen PHQ 2/9  Decreased Interest 1 1 0 0 0  Down, Depressed, Hopeless 1 1 1  0 0  PHQ - 2 Score 2 2 1  0 0  Altered sleeping 1 1 0 0 0  Tired, decreased energy 1 1 1  0 0  Change in appetite 1 1 0 0 0  Feeling bad or failure about yourself  1 1 0 0 0  Trouble concentrating 1 1 0 0 0  Moving slowly or fidgety/restless 0 0 0 0 0  Suicidal thoughts 0 0 0 0 0  PHQ-9 Score 7 7 2  0 0  Difficult doing work/chores Somewhat difficult Not  difficult at all Not difficult at all  Not difficult at all       07/03/2023    3:49 PM 03/03/2023    4:32 PM 08/01/2022    4:40 PM 06/16/2022    2:56 PM  GAD 7 : Generalized Anxiety Score  Nervous, Anxious, on Edge 1 1 1  0  Control/stop worrying 1 1  0  Worry too much - different things 1 1 1  0  Trouble relaxing 1 1 1  0  Restless 0 0 0 0  Easily annoyed or irritable 1 1 1  0  Afraid - awful might happen 1 1 1  0  Total GAD 7 Score 6 6  0  Anxiety Difficulty Somewhat difficult Not difficult at all Not difficult at all Not difficult at all       Assessment & Plan:  Assessment & Plan   Eye discomfort, bilateral     Assessment and Plan  Follow up plan: No follow-ups on file.  Dom Haverland Howell Pringle, MD

## 2023-07-23 ENCOUNTER — Encounter: Payer: Self-pay | Admitting: Pediatrics

## 2023-07-24 ENCOUNTER — Encounter: Payer: Self-pay | Admitting: Pediatrics

## 2023-07-24 ENCOUNTER — Telehealth (INDEPENDENT_AMBULATORY_CARE_PROVIDER_SITE_OTHER): Payer: Managed Care, Other (non HMO) | Admitting: Pediatrics

## 2023-07-24 DIAGNOSIS — K5900 Constipation, unspecified: Secondary | ICD-10-CM | POA: Diagnosis not present

## 2023-07-24 DIAGNOSIS — H5713 Ocular pain, bilateral: Secondary | ICD-10-CM

## 2023-07-24 NOTE — Progress Notes (Signed)
Telehealth Visit  I connected with  Lance Kim on 07/24/23 by a video enabled telemedicine application and verified that I am speaking with the correct person using two identifiers.   I discussed the limitations of evaluation and management by telemedicine. The patient expressed understanding and agreed to proceed.  Subjective:    Patient ID: Lance Kim, male    DOB: 1970-12-23, 52 y.o.   MRN: [id number]  HPI: Lance Kim is a 52 y.o. male  Chief Complaint  Patient presents with   Eye Problem    Patient says the area isn't hurting as much, but it is still about the same such as the swelling, but has noticed some swelling went down. Patient was seen by Ophthalmologist was prescribed eye drops and to follow up in a week.    Discussed the use of AI scribe software for clinical note transcription with the patient, who gave verbal consent to proceed.  History of Present Illness   The patient recently visited an ophthalmologist due to eye redness. The ophthalmologist prescribed eye drops, the specifics of which the patient could not recall, but mentioned a pink-lidded bottle. The patient was instructed to discontinue the use of allergy drops and instead use a lubricant along with the newly prescribed drops. The cause of the eye redness was not definitively determined, but allergies were ruled out.  In addition to the eye issue, the patient has been experiencing difficulties with bowel movements. Despite using Miralax, Senna, and a fiber supplement, the patient has not seen significant improvement. The patient plans to monitor the situation over the weekend before deciding whether to schedule a follow-up appointment.      Relevant past medical, surgical, family and social history reviewed and updated as indicated. Interim medical history since our last visit reviewed. Allergies and medications reviewed and updated.  ROS per HPI unless specifically indicated above      Objective:    There were no vitals taken for this visit.  Wt Readings from Last 3 Encounters:  07/22/23 252 lb 12.8 oz (114.7 kg)  07/03/23 252 lb 3.2 oz (114.4 kg)  03/03/23 248 lb (112.5 kg)     Physical Exam Constitutional:      General: He is not in acute distress.    Appearance: Normal appearance. He is not ill-appearing.  Pulmonary:     Effort: Pulmonary effort is normal.  Neurological:     General: No focal deficit present.     Mental Status: He is alert. Mental status is at baseline.  Psychiatric:        Mood and Affect: Mood normal.        Thought Content: Thought content normal.     LIMITED EXAM GIVEN VIDEO VISIT     Assessment & Plan:  Assessment & Plan   Eye discomfort, bilateral Evaluated by ophthalmologist who prescribed new eye drops (type unknown) and recommended discontinuation of allergy drops. No specific diagnosis provided by ophthalmologist. -Continue prescribed eye drops as directed by ophthalmologist. -Discontinue use of allergy drops. -Return to ophthalmologist in 1 week for follow-up.  Constipation, unspecified constipation type Ongoing issue despite use of Miralax, Senna, and fiber supplement. -Schedule follow-up appointment if no improvement by early next week.   Follow up plan: Return if symptoms worsen or fail to improve.  Anvay Tennis P Oshay Stranahan, MD  This visit was completed via video visit through MyChart due to the restrictions of the COVID-19 pandemic. All issues as above were discussed and addressed. Physical  exam was done as above through visual confirmation on video through MyChart. If it was felt that the patient should be evaluated in the office, they were directed there. The patient verbally consented to this visit."} Location of the patient: home Location of the provider: work Those involved with this call:  Provider: Modena Nunnery, MD CMA: Wilhemena Durie, CMA Time spent on call:  5 minutes on the phone discussing health concerns. 15  minutes total spent in review of patient's record and preparation of their chart.

## 2023-07-24 NOTE — Patient Instructions (Signed)
Follow up as needed

## 2023-08-06 ENCOUNTER — Inpatient Hospital Stay: Admission: RE | Admit: 2023-08-06 | Payer: BC Managed Care – PPO | Source: Ambulatory Visit

## 2023-08-06 ENCOUNTER — Ambulatory Visit: Payer: BC Managed Care – PPO

## 2023-08-10 ENCOUNTER — Encounter: Payer: Self-pay | Admitting: Nurse Practitioner

## 2023-08-10 DIAGNOSIS — F17210 Nicotine dependence, cigarettes, uncomplicated: Secondary | ICD-10-CM

## 2023-08-21 ENCOUNTER — Encounter: Payer: Self-pay | Admitting: Nurse Practitioner

## 2023-08-21 ENCOUNTER — Ambulatory Visit: Payer: Managed Care, Other (non HMO) | Admitting: Nurse Practitioner

## 2023-08-21 VITALS — BP 128/84 | HR 65 | Temp 98.8°F | Ht 64.0 in | Wt 252.2 lb

## 2023-08-21 DIAGNOSIS — K5901 Slow transit constipation: Secondary | ICD-10-CM | POA: Insufficient documentation

## 2023-08-21 DIAGNOSIS — R61 Generalized hyperhidrosis: Secondary | ICD-10-CM

## 2023-08-21 DIAGNOSIS — J069 Acute upper respiratory infection, unspecified: Secondary | ICD-10-CM

## 2023-08-21 DIAGNOSIS — J029 Acute pharyngitis, unspecified: Secondary | ICD-10-CM | POA: Diagnosis not present

## 2023-08-21 DIAGNOSIS — K5909 Other constipation: Secondary | ICD-10-CM

## 2023-08-21 MED ORDER — LINACLOTIDE 72 MCG PO CAPS: 72.0000 ug | ORAL_CAPSULE | Freq: Every day | ORAL | 0 refills | Status: DC

## 2023-08-21 NOTE — Assessment & Plan Note (Addendum)
Chronic.  Has tried OTC miralax, fiber and senna.  Will start Linzess daily.  Discussed side effects and benefits of medication during visit today.  Follow up in 2 weeks with Jolene.

## 2023-08-21 NOTE — Progress Notes (Signed)
BP 128/84 (BP Location: Left Arm, Patient Position: Sitting, Cuff Size: Normal)   Pulse 65   Temp 98.8 F (37.1 C) (Oral)   Ht 5\' 4"  (1.626 m)   Wt 252 lb 3.2 oz (114.4 kg)   SpO2 99%   BMI 43.29 kg/m    Subjective:    Patient ID: Lance Kim, male    DOB: 05/23/1971, 52 y.o.   MRN: [id number]  HPI: Lance Kim is a 52 y.o. male  Chief Complaint  Patient presents with   Sore Throat    Started this morning   Night Sweats   Constipation    OTC products have been ineffective   Muscle Pain   Joint Swelling   UPPER RESPIRATORY TRACT INFECTION Worst symptom: sore throat started this morning Fever: no Cough: yes Shortness of breath: no Wheezing: no Chest pain: no Chest tightness: no Chest congestion: yes Nasal congestion: yes Runny nose: yes Post nasal drip: yes Sneezing: yes Sore throat: yes Swollen glands: no Sinus pressure: no Headache: no Face pain: no Toothache: no Ear pain: no bilateral Ear pressure: no bilateral Eyes red/itching:no Eye drainage/crusting: no  Vomiting: no Rash: no Fatigue: yes Sick contacts: no Strep contacts: no  Context: worse Recurrent sinusitis: no Relief with OTC cold/cough medications: no  Treatments attempted:  cough drops and tessalon  ABDOMINAL ISSUES Duration: months Nature: bloating Location: diffuse  Severity: moderate  Radiation: no Episode duration: Frequency: intermittent Alleviating factors: BM Aggravating factors: not being able to go to the bathroom Treatments attempted:  miralax- BID and colace Constipation: yes Diarrhea: no Episodes of diarrhea/day: Mucous in the stool: no Heartburn: no Bloating:yes Flatulence: yes Nausea: no Vomiting: no Episodes of vomit/day: Melena or hematochezia: no Rash: no Jaundice: no Fever: no Weight loss: no He is drinking 8-10 bottles of water per day.     Relevant past medical, surgical, family and social history reviewed and updated as indicated.  Interim medical history since our last visit reviewed. Allergies and medications reviewed and updated.  Review of Systems  Constitutional:  Positive for fatigue. Negative for fever.  HENT:  Positive for congestion, postnasal drip, rhinorrhea and sore throat. Negative for ear pain, sinus pressure, sinus pain and sneezing.   Respiratory:  Positive for cough. Negative for chest tightness, shortness of breath and wheezing.   Gastrointestinal:  Positive for abdominal pain and constipation. Negative for diarrhea, nausea and vomiting.  Skin:  Negative for rash.  Neurological:  Negative for headaches.    Per HPI unless specifically indicated above     Objective:    BP 128/84 (BP Location: Left Arm, Patient Position: Sitting, Cuff Size: Normal)   Pulse 65   Temp 98.8 F (37.1 C) (Oral)   Ht 5\' 4"  (1.626 m)   Wt 252 lb 3.2 oz (114.4 kg)   SpO2 99%   BMI 43.29 kg/m   Wt Readings from Last 3 Encounters:  08/21/23 252 lb 3.2 oz (114.4 kg)  07/22/23 252 lb 12.8 oz (114.7 kg)  07/03/23 252 lb 3.2 oz (114.4 kg)    Physical Exam Vitals and nursing note reviewed.  Constitutional:      General: He is not in acute distress.    Appearance: Normal appearance. He is not ill-appearing, toxic-appearing or diaphoretic.  HENT:     Head: Normocephalic.     Right Ear: External ear normal. There is impacted cerumen.     Left Ear: External ear normal. There is impacted cerumen.     Nose:  Nose normal. No congestion or rhinorrhea.     Mouth/Throat:     Mouth: Mucous membranes are moist.     Pharynx: Posterior oropharyngeal erythema present. No oropharyngeal exudate.  Eyes:     General:        Right eye: No discharge.        Left eye: No discharge.     Extraocular Movements: Extraocular movements intact.     Conjunctiva/sclera: Conjunctivae normal.     Pupils: Pupils are equal, round, and reactive to light.  Cardiovascular:     Rate and Rhythm: Normal rate and regular rhythm.     Heart sounds: No  murmur heard. Pulmonary:     Effort: Pulmonary effort is normal. No respiratory distress.     Breath sounds: Normal breath sounds. No wheezing, rhonchi or rales.  Abdominal:     General: Abdomen is flat. Bowel sounds are normal. There is no distension.     Tenderness: There is no abdominal tenderness. There is no right CVA tenderness, left CVA tenderness or guarding.  Musculoskeletal:     Cervical back: Normal range of motion and neck supple.  Skin:    General: Skin is warm and dry.     Capillary Refill: Capillary refill takes less than 2 seconds.  Neurological:     General: No focal deficit present.     Mental Status: He is alert and oriented to person, place, and time.  Psychiatric:        Mood and Affect: Mood normal.        Behavior: Behavior normal.        Thought Content: Thought content normal.        Judgment: Judgment normal.     Results for orders placed or performed in visit on 08/21/23  Rapid Strep screen([facility])   Specimen: Other   Other  Result Value Ref Range   Strep Gp A Ag, IA W/Reflex Negative Negative  Culture, Group A Strep   Other  Result Value Ref Range   Strep A Culture WILL FOLLOW       Assessment & Plan:   Problem List Items Addressed This Visit       Other   Other constipation - Primary    Chronic.  Has tried OTC miralax, fiber and senna.  Will start Linzess daily.  Discussed side effects and benefits of medication during visit today.  Follow up in 2 weeks with Jolene.      Other Visit Diagnoses     Viral upper respiratory tract infection       Recommend over the counter symptom management. Recommend staying well hydrated and resting.  Strep negative in office.  Follow up if not improved.   Sore throat       Relevant Orders   Rapid Strep screen([facility]) (Completed)   Night sweats       Relevant Orders   Novel Coronavirus, NAA ([facility])        Follow up plan: Return in about 2 weeks (around 09/04/2023) for  Joint Swelling and Muscle Pain- with Jolene.

## 2023-08-22 LAB — NOVEL CORONAVIRUS, NAA: SARS-CoV-2, NAA: NOT DETECTED

## 2023-08-24 LAB — CULTURE, GROUP A STREP

## 2023-08-24 LAB — RAPID STREP SCREEN (MED CTR MEBANE ONLY): Strep Gp A Ag, IA W/Reflex: NEGATIVE

## 2023-08-25 ENCOUNTER — Other Ambulatory Visit: Payer: Self-pay

## 2023-08-25 DIAGNOSIS — Z122 Encounter for screening for malignant neoplasm of respiratory organs: Secondary | ICD-10-CM

## 2023-08-25 DIAGNOSIS — F17210 Nicotine dependence, cigarettes, uncomplicated: Secondary | ICD-10-CM

## 2023-08-25 DIAGNOSIS — Z87891 Personal history of nicotine dependence: Secondary | ICD-10-CM

## 2023-09-05 NOTE — Patient Instructions (Incomplete)
IMAGING LOCATION: [id number] [place], [place], [place] [address] [phone]   Be Involved in Caring For Your Health:  Taking Medications When medications are taken as directed, they can greatly improve your health. But if they are not taken as prescribed, they may not work. In some cases, not taking them correctly can be harmful. To help ensure your treatment remains effective and safe, understand your medications and how to take them. Bring your medications to each visit for review by your provider.  Your lab results, notes, and after visit summary will be available on My Chart. We strongly encourage you to use this feature. If lab results are abnormal the clinic will contact you with the appropriate steps. If the clinic does not contact you assume the results are satisfactory. You can always view your results on My Chart. If you have questions regarding your health or results, please contact the clinic during office hours. You can also ask questions on My Chart.  We at [facility] are grateful that you chose [place] to provide your care. We strive to provide evidence-based and compassionate care and are always looking for feedback. If you get a survey from the clinic please complete this so we can hear your opinions.  Joint Pain  Joint pain can be caused by many things. It is likely to go away if you follow instructions from your doctor for taking care of yourself at home. Sometimes, you may need more treatment. Follow these instructions at home: Managing pain, stiffness, and swelling     If told, put ice on the painful area. To do this: If you have a removable elastic bandage, sling, or splint, take it off as told by your doctor. Put ice in a plastic bag. Place a towel between your skin and the bag. Leave the ice on for 20 minutes, 2-3 times a day. Take off the ice if your skin turns bright red. This is very important. If you cannot feel pain, heat, or cold, you have a  greater risk of damage to the area. Move your fingers or toes below the painful joint often. Raise the painful joint above the level of your heart while you are sitting or lying down. If told, put heat on the painful area. Do this as often as told by your doctor. Use the heat source that your doctor recommends, such as a moist heat pack or a heating pad. Place a towel between your skin and the heat source. Leave the heat on for 20-30 minutes. Take off the heat if your skin gets bright red. This is especially important if you are unable to feel pain, heat, or cold. You may have a greater risk of getting burned. Activity Rest the painful joint for as long as told by your doctor. Do not do things that cause pain or make your pain worse. Begin exercising or stretching the affected area, as told by your doctor. Ask your doctor what types of exercise are safe for you. Return to your normal activities when your doctor says that it is safe. If you have an elastic bandage, sling, or splint: Wear it as told by your doctor. Take it only as told by your doctor. Loosen it your fingers or toes below the joint: Tingle. Become numb. Get cold and blue. Keep it clean. Ask your doctor if you should take it off before bathing. If it is not waterproof: Do not let it get wet. Cover it with a watertight covering when  you take a bath or shower. General instructions Take over-the-counter and prescription medicines only as told by your doctor. This may include medicines taken by mouth or applied to the skin. Do not smoke or use any products that contain nicotine or tobacco. If you need help quitting, ask your doctor. Keep all follow-up visits as told by your doctor. This is important. Contact a doctor if: You have pain that gets worse and does not get better with medicine. Your joint pain does not get better in 3 days. You have more bruising or swelling. You have a fever. You lose 10 lb (4.5 kg) or more without  trying. Get help right away if: You cannot move the joint. Your fingers or toes tingle, become numb. or get cold and blue. You have a fever along with a joint that is red, warm, and swollen. Summary Joint pain can be caused by many things. It often goes away if you follow instructions from your doctor for taking care of yourself at home. Rest the painful joint for as long as told. Do not do things that cause pain or make your pain worse. Take over-the-counter and prescription medicines only as told by your doctor. This information is not intended to replace advice given to you by your health care provider. Make sure you discuss any questions you have with your health care provider. Document Revised: 12/13/2019 Document Reviewed: 12/14/2019 Elsevier Patient Education  2024 [employer].

## 2023-09-07 ENCOUNTER — Ambulatory Visit: Payer: Managed Care, Other (non HMO) | Admitting: Nurse Practitioner

## 2023-09-07 ENCOUNTER — Encounter: Payer: Self-pay | Admitting: Nurse Practitioner

## 2023-09-07 VITALS — BP 114/72 | HR 87 | Temp 98.1°F | Ht 64.0 in | Wt 249.6 lb

## 2023-09-07 DIAGNOSIS — M2550 Pain in unspecified joint: Secondary | ICD-10-CM

## 2023-09-07 DIAGNOSIS — E66813 Obesity, class 3: Secondary | ICD-10-CM

## 2023-09-07 DIAGNOSIS — Z6841 Body Mass Index (BMI) 40.0 and over, adult: Secondary | ICD-10-CM

## 2023-09-07 DIAGNOSIS — K5901 Slow transit constipation: Secondary | ICD-10-CM | POA: Diagnosis not present

## 2023-09-07 MED ORDER — LINACLOTIDE 72 MCG PO CAPS: 72.0000 ug | ORAL_CAPSULE | Freq: Every day | ORAL | 0 refills | Status: DC

## 2023-09-07 NOTE — Progress Notes (Signed)
BP 114/72   Pulse 87   Temp 98.1 F (36.7 C) (Oral)   Ht 5\' 4"  (1.626 m)   Wt 249 lb 9.6 oz (113.2 kg)   SpO2 98%   BMI 42.84 kg/m    Subjective:    Patient ID: Lance Kim, male    DOB: 10-05-1970, 52 y.o.   MRN: [id number]  HPI: Lance Kim is a 52 y.o. male  Chief Complaint  Patient presents with   Muscle Pain   Joint Swelling   Constipation    Patient says he is still having issues with Constipation. Patient says he was prescribed a medication at his last in office visit and says that is the only time he has a movement. Patient says when he does goes it tends to be a watery stool.    ARTHRALGIAS / JOINT ACHES + CONSTIPATION Follow-up today for joint pain and constipation.  At visit on 08/21/23 was having bilateral knee pain and left shoulder discomfort. Over past week has been stable.    Constipation present for 2 1/2 months.  Was prescribed Linzess on 08/21/23. Takes this every day and has been having runny stools.  Normally at baseline goes more then once a day and now does not.  Is having BM once a day.  Stomach will feel like it needs to go, but when tries cannot.  Feels bloated at baseline.  Does strain with exception of when stool watery.  No blood in stool. No N&V recently, since being treated for sinus issues.  Last colonoscopy was 09/02/2019, one polyp removed. Grandma passed of cancer, but not sure what kind.   Continues to smoke, 1/2 PPD.  Has been working long hours at work, snacks often.  Does drink lots of water - five to six 20 oz bottles daily.  Prior to last visit had tried Metamucil, Senna S, Probiotic, Fiber pills.  Has cut back on cheese intake. Duration: chronic with flares Pain: no Symmetric: 0/10 Quality: dull aching Frequency: intermittent Context:  better Decreased function/range of motion: no Erythema: no Swelling: yes to knees at times Heat or warmth: no Morning stiffness: yes to back Aggravating factors: sitting for long  period Alleviating factors: stretching Relief with NSAIDs?: No NSAIDs Taken Treatments attempted:  rest, heat, APAP, and ibuprofen  Involved Joints:     Hands: none    Wrists: none    Elbows: none    Shoulders: yes left    Back: yes     Hips: none    Knees: yes bilateral    Ankles: none    Feet: none  Relevant past medical, surgical, family and social history reviewed and updated as indicated. Interim medical history since our last visit reviewed. Allergies and medications reviewed and updated.  Review of Systems  Constitutional:  Positive for appetite change (about the past month minimal appetite). Negative for activity change, chills, fatigue and fever.  Respiratory:  Negative for cough, chest tightness, shortness of breath and wheezing.   Cardiovascular:  Negative for chest pain, palpitations and leg swelling.  Gastrointestinal:  Positive for abdominal distention and constipation. Negative for abdominal pain, blood in stool, diarrhea, nausea, rectal pain and vomiting.  Psychiatric/Behavioral: Negative.      Per HPI unless specifically indicated above     Objective:    BP 114/72   Pulse 87   Temp 98.1 F (36.7 C) (Oral)   Ht 5\' 4"  (1.626 m)   Wt 249 lb 9.6 oz (113.2 kg)  SpO2 98%   BMI 42.84 kg/m   Wt Readings from Last 3 Encounters:  09/07/23 249 lb 9.6 oz (113.2 kg)  08/21/23 252 lb 3.2 oz (114.4 kg)  07/22/23 252 lb 12.8 oz (114.7 kg)    Physical Exam Vitals and nursing note reviewed.  Constitutional:      General: He is awake. He is not in acute distress.    Appearance: He is well-developed and well-groomed. He is obese. He is not ill-appearing or toxic-appearing.  HENT:     Head: Normocephalic.     Right Ear: Hearing and external ear normal.     Left Ear: Hearing and external ear normal.  Eyes:     General: Lids are normal.     Extraocular Movements: Extraocular movements intact.     Conjunctiva/sclera: Conjunctivae normal.  Neck:     Thyroid: No  thyromegaly.     Vascular: No carotid bruit.  Cardiovascular:     Rate and Rhythm: Normal rate and regular rhythm.     Heart sounds: Normal heart sounds. No murmur heard.    No gallop.  Pulmonary:     Effort: Pulmonary effort is normal. No accessory muscle usage or respiratory distress.     Breath sounds: Normal breath sounds. No decreased breath sounds, wheezing or rhonchi.  Abdominal:     General: Bowel sounds are normal. There is no distension.     Palpations: Abdomen is soft. There is no mass.     Tenderness: There is no abdominal tenderness. There is no right CVA tenderness or left CVA tenderness.  Musculoskeletal:     Cervical back: Full passive range of motion without pain.     Right lower leg: No edema.     Left lower leg: No edema.  Lymphadenopathy:     Cervical: No cervical adenopathy.  Skin:    General: Skin is warm.     Capillary Refill: Capillary refill takes less than 2 seconds.  Neurological:     Mental Status: He is alert and oriented to person, place, and time.     Deep Tendon Reflexes: Reflexes are normal and symmetric.     Reflex Scores:      Brachioradialis reflexes are 2+ on the right side and 2+ on the left side.      Patellar reflexes are 2+ on the right side and 2+ on the left side. Psychiatric:        Attention and Perception: Attention normal.        Mood and Affect: Mood normal.        Speech: Speech normal.        Behavior: Behavior normal. Behavior is cooperative.        Thought Content: Thought content normal.    Results for orders placed or performed in visit on 08/21/23  Rapid Strep screen([facility])   Collection Time: 08/21/23 10:19 AM   Specimen: Other   Other  Result Value Ref Range   Strep Gp A Ag, IA W/Reflex Negative Negative  Culture, Group A Strep   Collection Time: 08/21/23 10:19 AM   Other  Result Value Ref Range   Strep A Culture Negative   Novel Coronavirus, NAA ([facility])   Collection Time: 08/21/23 10:25 AM    Specimen: Nasopharyngeal(NP) swabs in vial transport medium  Result Value Ref Range   SARS-CoV-2, NAA Not Detected Not Detected      Assessment & Plan:   Problem List Items Addressed This Visit       Digestive  Slow transit constipation   Present for 2 1/2 months with some benefit from Linzess but continues to have reduced appetite and feels bloated and strains.  Will obtain KUB to assess bowels, consider CT in future.  Labs today CM and TSH.  Recommend he continue Linzess for now and current at home regimen + good fluid and fiber intake.  Referral to GI as is higher risk with smoking.      Relevant Orders   DG Abd 1 View   Ambulatory referral to Gastroenterology   Comprehensive metabolic panel   TSH     Other   Multiple joint pain   Chronic with recent flare, improved at this time.  Continue current at home regimen and if worsening to alert provider.      Obesity - Primary   BMI 42.84.  Recommended eating smaller high protein, low fat meals more frequently and exercising 30 mins a day 5 times a week with a goal of 10-15lb weight loss in the next 3 months. Patient voiced their understanding and motivation to adhere to these recommendations.         Follow up plan: Return in about 4 weeks (around 10/05/2023) for Constipation.

## 2023-09-07 NOTE — Assessment & Plan Note (Signed)
Present for 2 1/2 months with some benefit from Linzess but continues to have reduced appetite and feels bloated and strains.  Will obtain KUB to assess bowels, consider CT in future.  Labs today CM and TSH.  Recommend he continue Linzess for now and current at home regimen + good fluid and fiber intake.  Referral to GI as is higher risk with smoking.

## 2023-09-07 NOTE — Assessment & Plan Note (Signed)
Chronic with recent flare, improved at this time.  Continue current at home regimen and if worsening to alert provider.

## 2023-09-07 NOTE — Assessment & Plan Note (Signed)
BMI 42.84.  Recommended eating smaller high protein, low fat meals more frequently and exercising 30 mins a day 5 times a week with a goal of 10-15lb weight loss in the next 3 months. Patient voiced their understanding and motivation to adhere to these recommendations.

## 2023-09-08 ENCOUNTER — Other Ambulatory Visit: Payer: Self-pay | Admitting: Nurse Practitioner

## 2023-09-08 DIAGNOSIS — R7989 Other specified abnormal findings of blood chemistry: Secondary | ICD-10-CM

## 2023-09-08 LAB — COMPREHENSIVE METABOLIC PANEL
ALT: 38 [IU]/L (ref 0–44)
AST: 25 [IU]/L (ref 0–40)
Albumin: 4 g/dL (ref 3.8–4.9)
Alkaline Phosphatase: 79 [IU]/L (ref 44–121)
BUN/Creatinine Ratio: 11 (ref 9–20)
BUN: 11 mg/dL (ref 6–24)
Bilirubin Total: <0.2 mg/dL (ref 0.0–1.2)
CO2: 24 mmol/L (ref 20–29)
Calcium: 9 mg/dL (ref 8.7–10.2)
Chloride: 102 mmol/L (ref 96–106)
Creatinine, Ser: 0.97 mg/dL (ref 0.76–1.27)
Globulin, Total: 2.8 g/dL (ref 1.5–4.5)
Glucose: 133 mg/dL — ABNORMAL HIGH (ref 70–99)
Potassium: 4.7 mmol/L (ref 3.5–5.2)
Sodium: 139 mmol/L (ref 134–144)
Total Protein: 6.8 g/dL (ref 6.0–8.5)
eGFR: 93 mL/min/{1.73_m2} (ref >59)

## 2023-09-08 LAB — TSH: TSH: 0.332 u[IU]/mL — ABNORMAL LOW (ref 0.450–4.500)

## 2023-09-08 NOTE — Progress Notes (Signed)
Contacted via MyChart = needs lab only visit in 2 weeks please Good morning Lance Kim, your labs have returned: - Kidney function, creatinine and eGFR, remains normal, as is liver function, AST and ALT.  - Thyroid level, TSH, is a little low (more hyperthyroid - overactive).  I would like to recheck this in 2 weeks via outpatient labs.  Staff will call to schedule this with you.  The level can vary for many reasons and may be normal with no issues, so want to recheck.  If taking any Biotin supplement, stop this over the next two weeks. Any questions?  Merry Christmas!! Keep being amazing!!  Thank you for allowing me to participate in your care.  I appreciate you. Kindest regards, Senita Corredor

## 2023-09-10 ENCOUNTER — Encounter: Payer: Self-pay | Admitting: *Deleted

## 2023-09-14 ENCOUNTER — Ambulatory Visit: Payer: Managed Care, Other (non HMO)

## 2023-09-14 ENCOUNTER — Ambulatory Visit
Admission: RE | Admit: 2023-09-14 | Discharge: 2023-09-14 | Disposition: A | Discharge status: Home / Self Care | Payer: Managed Care, Other (non HMO) | Source: Ambulatory Visit | Attending: Nurse Practitioner | Admitting: Nurse Practitioner

## 2023-09-14 DIAGNOSIS — F17210 Nicotine dependence, cigarettes, uncomplicated: Secondary | ICD-10-CM

## 2023-09-14 DIAGNOSIS — Z122 Encounter for screening for malignant neoplasm of respiratory organs: Secondary | ICD-10-CM

## 2023-09-14 DIAGNOSIS — Z87891 Personal history of nicotine dependence: Secondary | ICD-10-CM

## 2023-09-23 ENCOUNTER — Other Ambulatory Visit: Payer: Self-pay | Admitting: Nurse Practitioner

## 2023-09-24 ENCOUNTER — Other Ambulatory Visit (INDEPENDENT_AMBULATORY_CARE_PROVIDER_SITE_OTHER): Payer: Managed Care, Other (non HMO)

## 2023-09-24 DIAGNOSIS — R7989 Other specified abnormal findings of blood chemistry: Secondary | ICD-10-CM

## 2023-09-25 ENCOUNTER — Encounter: Payer: Self-pay | Admitting: Nurse Practitioner

## 2023-09-25 LAB — TSH: TSH: 0.608 u[IU]/mL (ref 0.450–4.500)

## 2023-09-25 LAB — T4, FREE: Free T4: 1.12 ng/dL (ref 0.82–1.77)

## 2023-09-25 NOTE — Progress Notes (Signed)
 Contacted via MyChart   Good morning Lance Kim, your labs have returned and thyroid levels are now normal.  Wonderful news.  No changes needed.  Any questions? Keep being amazing!!  Thank you for allowing me to participate in your care.  I appreciate you. Kindest regards, Malky Rudzinski

## 2023-09-28 ENCOUNTER — Other Ambulatory Visit: Payer: Self-pay

## 2023-09-28 DIAGNOSIS — F17210 Nicotine dependence, cigarettes, uncomplicated: Secondary | ICD-10-CM

## 2023-09-28 DIAGNOSIS — Z87891 Personal history of nicotine dependence: Secondary | ICD-10-CM

## 2023-09-28 DIAGNOSIS — Z122 Encounter for screening for malignant neoplasm of respiratory organs: Secondary | ICD-10-CM

## 2023-09-28 NOTE — Telephone Encounter (Signed)
 Has upcoming follow up appt so 30 day supply given.   Requested Prescriptions  Pending Prescriptions Disp Refills   LINZESS  72 MCG capsule [Pharmacy Med Name: LINZESS  CAPSULES] 30 capsule 0    Sig: TAKE 1 CAPSULE(72 MCG) BY MOUTH DAILY BEFORE BREAKFAST     Gastroenterology: Irritable Bowel Syndrome Passed - 09/28/2023  7:49 AM      Passed - Valid encounter within last 12 months    Recent Outpatient Visits           3 weeks ago Class 3 severe obesity due to excess calories with serious comorbidity and body mass index (BMI) of 40.0 to 44.9 in adult [facility])   [place] [facility] [place], Melanie T, NP   1 month ago Other constipation   [place] [facility] Melvin Pao, NP   2 months ago Eye discomfort, bilateral   [place] [facility] Herold Hadassah SQUIBB, MD   2 months ago Eye discomfort, bilateral   [place] [facility] Herold Hadassah SQUIBB, MD   2 months ago Type 2 diabetes mellitus with obesity (HCC)   [place] [facility] [place], Melanie DASEN, NP       Future Appointments             In 2 weeks [place], Jolene T, NP [place] [facility], [facility]   In 3 months [place], Melanie DASEN, NP [place] [facility], [facility]

## 2023-10-06 ENCOUNTER — Encounter: Payer: Self-pay | Admitting: Pediatrics

## 2023-10-06 ENCOUNTER — Telehealth (INDEPENDENT_AMBULATORY_CARE_PROVIDER_SITE_OTHER): Payer: Managed Care, Other (non HMO) | Admitting: Pediatrics

## 2023-10-06 DIAGNOSIS — J0101 Acute recurrent maxillary sinusitis: Secondary | ICD-10-CM

## 2023-10-06 DIAGNOSIS — K0889 Other specified disorders of teeth and supporting structures: Secondary | ICD-10-CM

## 2023-10-06 MED ORDER — AMOXICILLIN 500 MG PO CAPS: 500.0000 mg | ORAL_CAPSULE | Freq: Three times a day (TID) | ORAL | 0 refills | Status: DC

## 2023-10-06 NOTE — Progress Notes (Signed)
Telehealth Visit  I connected with  Lance Kim on 10/06/23 by a video enabled telemedicine application and verified that I am speaking with the correct person using two identifiers.   I discussed the limitations of evaluation and management by telemedicine. The patient expressed understanding and agreed to proceed.  Subjective:    Patient ID: Lance Kim, male    DOB: Mar 11, 1971, 53 y.o.   MRN: [id number]  HPI: Lance Kim is a 53 y.o. male  Chief Complaint  Patient presents with   Sinus Problem    Started Dec 12th when he went to dentist and told it was his sinus causing the issue. Started right side now moved as of last night on left side. Left sided facial swelling and congestion. Also has a headache.     Discussed the use of AI scribe software for clinical note transcription with the patient, who gave verbal consent to proceed.  History of Present Illness   The patient, with a history of sinus issues, presented with a recurrence of symptoms similar to symptoms that began around December 12th. He initially experienced right-sided dental pain, which was treated with amoxicillin by his dentist. Recently, he has noticed similar pain on the left side that started yesterday. The discomfort is primarily felt in the cheek area, with some associated swelling noted upon waking.  In addition to the dental pain, the patient has been experiencing significant congestion, with clear mucus production and a cough. He also reports headaches, which he believes are related to his sinus issues.  The patient also mentions having had night sweats approximately two weeks ago, which he attributes to possible low blood sugar due to inadequate food intake. He has been experiencing some stomach issues, but denies any nausea.  The patient's symptoms seem to have worsened last night, with the dental pain returning and his nasal congestion persisting since December 12th. He expresses concern that  his condition is deteriorating in a similar manner to his previous sinus issue.     relevant past medical, surgical, family and social history reviewed and updated as indicated. Interim medical history since our last visit reviewed. Allergies and medications reviewed and updated.  ROS per HPI unless specifically indicated above     Objective:    There were no vitals taken for this visit.  Wt Readings from Last 3 Encounters:  09/07/23 249 lb 9.6 oz (113.2 kg)  08/21/23 252 lb 3.2 oz (114.4 kg)  07/22/23 252 lb 12.8 oz (114.7 kg)     Physical Exam Constitutional:      General: He is not in acute distress.    Appearance: He is normal weight. He is not ill-appearing.  HENT:     Nose: Congestion present.     Comments: Points to sinus pressure and tenderness at maxillary sinus area Pulmonary:     Effort: Pulmonary effort is normal.  Neurological:     General: No focal deficit present.     Mental Status: He is alert. Mental status is at baseline.  Psychiatric:        Mood and Affect: Mood normal.        Behavior: Behavior normal.      LIMITED EXAM GIVEN VIDEO VISIT     Assessment & Plan:  Assessment & Plan   Acute recurrent maxillary sinusitis Recurrent maxillary sinus pain, congestion, and clear mucus production. Chills at home. Limited by video visit but given similar/persistent symptoms, will treat as bacterial sinusitis, though if tooth  infection will also have coverage for that. Encouraged patient to wait 2-3 days to see if would resolve on its own before starting antibiotic, as may be related to viral infection.  History of similar symptoms in December treated with amoxicillin. Has follow up in 1 week with PCP. If ongoing recurrence, consider specialist evaluation. -Start Amoxicillin for 7 days. -Return to clinic if symptoms worsen or do not improve. -     Amoxicillin; Take 1 capsule (500 mg total) by mouth 3 (three) times daily for 7 days.  Dispense: 21 capsule; Refill:  0  Pain, dental Pain in teeth on both sides, initially right now left. No jaw pain reported. Swelling noted in the morning. suspect related to above. -Continue with dental follow-up as planned.   Follow up plan: Return if symptoms worsen or fail to improve.  Abhay Godbolt P [place], MD   This visit was completed via video visit through MyChart due to the restrictions of the COVID-19 pandemic. All issues as above were discussed and addressed. Physical exam was done as above through visual confirmation on video through MyChart. If it was felt that the patient should be evaluated in the office, they were directed there. The patient verbally consented to this visit."} Location of the patient: work Location of the provider: work Those involved with this call:  Provider: Modena Nunnery, MD CMA:  Babs Bertin, CMA Time spent on call:  10 minutes with patient face to face via video conference. More than 50% of this time was spent in counseling and coordination of care. 20 minutes total spent in review of patient's record and preparation of their chart. Total time spent on this encounter: 30 minutes.

## 2023-10-06 NOTE — Patient Instructions (Signed)
You can wait to see if it gets better on its own in the next 3 days, if not ok to start the antibiotic

## 2023-10-10 NOTE — Patient Instructions (Incomplete)

## 2023-10-13 ENCOUNTER — Ambulatory Visit: Payer: Managed Care, Other (non HMO) | Admitting: Nurse Practitioner

## 2023-10-13 ENCOUNTER — Encounter: Payer: Self-pay | Admitting: Nurse Practitioner

## 2023-10-13 VITALS — BP 125/79 | HR 71 | Temp 98.1°F | Ht 64.0 in | Wt 251.8 lb

## 2023-10-13 DIAGNOSIS — K5901 Slow transit constipation: Secondary | ICD-10-CM

## 2023-10-13 NOTE — Assessment & Plan Note (Signed)
Present for approx 3 months with some benefit from Linzess but continues to have reduced appetite and feels bloated and strains in afternoon and evening hours.  He will obtain KUB as ordered.  Recent labs reassuring.  Recommend he continue Linzess for now and current at home regimen + good fluid and fiber intake plus add on Senna S mid day.  Recommend he cut back on MJ use and discussed effect of this on stomach health.  Scheduled to see GI upcoming, which will be beneficial.  Appreciate their input.

## 2023-10-13 NOTE — Progress Notes (Signed)
BP 125/79   Pulse 71   Temp 98.1 F (36.7 C) (Oral)   Ht 5\' 4"  (1.626 m)   Wt 251 lb 12.8 oz (114.2 kg)   SpO2 98%   BMI 43.22 kg/m    Subjective:    Patient ID: Lance Kim, male    DOB: 01-13-1971, 53 y.o.   MRN: [id number]  HPI: Lance Kim is a 53 y.o. male  Chief Complaint  Patient presents with   Constipation    Patient states the constipation does not seem to be much better. States he is still having issues having a BM sometimes.    CONSTIPATION Follow-up today for constipation which continues to be an issue.  He did not attend KUB as ordered last visit.  Scheduled for GI on February 13th.  Is taking Linzess only at this time, stopped Fiber at present.  When takes Linzess he does go to bathroom, takes at night and able to pass bowels easier in morning but during daytime struggles to pass bowels. Does get bloated and nauseous at times with constipation. Does take Zofran for nausea with Otezla.  No pain in abdomen, sometimes feels tight.  Appetite is reduced, eating 2 meals a day at present with snacks (apples and bananas).  Prior to this ate 3 meals at baseline.  No family history of colon cancer.  Continues to smoke nicotine.  Minimal alcohol use. Does smoke MJ 2-3 times a day. Duration:months Alleviating factors: Linzess in morning only Aggravating factors: unknown Status:  no improvement Treatments attempted:  as above Fever: no Nausea: yes Vomiting:  x 2 episodes Weight loss: no Decreased appetite: yes Diarrhea: no Constipation: yes Blood in stool: no Heartburn: no Jaundice: no Rash: no  Relevant past medical, surgical, family and social history reviewed and updated as indicated. Interim medical history since our last visit reviewed. Allergies and medications reviewed and updated.  Review of Systems  Constitutional:  Positive for appetite change (ongoing >1 month). Negative for activity change, chills, fatigue and fever.  Respiratory:  Negative  for cough, chest tightness, shortness of breath and wheezing.   Cardiovascular:  Negative for chest pain, palpitations and leg swelling.  Gastrointestinal:  Positive for abdominal distention and constipation. Negative for abdominal pain, blood in stool, diarrhea, nausea, rectal pain and vomiting.  Psychiatric/Behavioral: Negative.     Per HPI unless specifically indicated above     Objective:    BP 125/79   Pulse 71   Temp 98.1 F (36.7 C) (Oral)   Ht 5\' 4"  (1.626 m)   Wt 251 lb 12.8 oz (114.2 kg)   SpO2 98%   BMI 43.22 kg/m   Wt Readings from Last 3 Encounters:  10/13/23 251 lb 12.8 oz (114.2 kg)  09/07/23 249 lb 9.6 oz (113.2 kg)  08/21/23 252 lb 3.2 oz (114.4 kg)    Physical Exam Vitals and nursing note reviewed.  Constitutional:      General: He is awake. He is not in acute distress.    Appearance: He is well-developed and well-groomed. He is obese. He is not ill-appearing or toxic-appearing.  HENT:     Head: Normocephalic.     Right Ear: Hearing and external ear normal.     Left Ear: Hearing and external ear normal.  Eyes:     General: Lids are normal.     Extraocular Movements: Extraocular movements intact.     Conjunctiva/sclera: Conjunctivae normal.  Neck:     Thyroid: No thyromegaly.  Vascular: No carotid bruit.  Cardiovascular:     Rate and Rhythm: Normal rate and regular rhythm.     Heart sounds: Normal heart sounds. No murmur heard.    No gallop.  Pulmonary:     Effort: Pulmonary effort is normal. No accessory muscle usage or respiratory distress.     Breath sounds: Normal breath sounds. No decreased breath sounds, wheezing or rhonchi.  Abdominal:     General: Bowel sounds are normal. There is no distension.     Palpations: Abdomen is soft. There is no mass.     Tenderness: There is no abdominal tenderness. There is no right CVA tenderness or left CVA tenderness.  Musculoskeletal:     Cervical back: Full passive range of motion without pain.      Right lower leg: No edema.     Left lower leg: No edema.  Lymphadenopathy:     Cervical: No cervical adenopathy.  Skin:    General: Skin is warm.     Capillary Refill: Capillary refill takes less than 2 seconds.  Neurological:     Mental Status: He is alert and oriented to person, place, and time.     Deep Tendon Reflexes: Reflexes are normal and symmetric.     Reflex Scores:      Brachioradialis reflexes are 2+ on the right side and 2+ on the left side.      Patellar reflexes are 2+ on the right side and 2+ on the left side. Psychiatric:        Attention and Perception: Attention normal.        Mood and Affect: Mood normal.        Speech: Speech normal.        Behavior: Behavior normal. Behavior is cooperative.        Thought Content: Thought content normal.     Results for orders placed or performed in visit on 09/24/23  TSH   Collection Time: 09/24/23  9:48 AM  Result Value Ref Range   TSH 0.608 0.450 - 4.500 uIU/mL  T4, free   Collection Time: 09/24/23  9:48 AM  Result Value Ref Range   Free T4 1.12 0.82 - 1.77 ng/dL      Assessment & Plan:   Problem List Items Addressed This Visit       Digestive   Slow transit constipation - Primary   Present for approx 3 months with some benefit from Linzess but continues to have reduced appetite and feels bloated and strains in afternoon and evening hours.  He will obtain KUB as ordered.  Recent labs reassuring.  Recommend he continue Linzess for now and current at home regimen + good fluid and fiber intake plus add on Senna S mid day.  Recommend he cut back on MJ use and discussed effect of this on stomach health.  Scheduled to see GI upcoming, which will be beneficial.  Appreciate their input.        Follow up plan: Return in about 20 days (around 11/02/2023) for Constipation.

## 2023-10-16 ENCOUNTER — Ambulatory Visit
Admission: RE | Admit: 2023-10-16 | Discharge: 2023-10-16 | Disposition: A | Discharge status: Home / Self Care | Payer: Managed Care, Other (non HMO) | Attending: Nurse Practitioner | Admitting: Nurse Practitioner

## 2023-10-16 ENCOUNTER — Ambulatory Visit
Admission: RE | Admit: 2023-10-16 | Discharge: 2023-10-16 | Disposition: A | Discharge status: Home / Self Care | Payer: Managed Care, Other (non HMO) | Source: Ambulatory Visit | Attending: Nurse Practitioner | Admitting: Nurse Practitioner

## 2023-10-16 DIAGNOSIS — K5901 Slow transit constipation: Secondary | ICD-10-CM | POA: Diagnosis present

## 2023-10-20 IMAGING — CT CT CHEST LUNG CANCER SCREENING LOW DOSE W/O CM
2 of 5 series · 15 of 40 positions shown, 18 images · non-contrast
Comparison: 04/13/2020

CLINICAL DATA: Twenty-five pack-year smoking history. Current
smoker.

EXAM:
CT CHEST WITHOUT CONTRAST LOW-DOSE FOR LUNG CANCER SCREENING
TECHNIQUE: Multidetector CT imaging of the chest was performed following the
standard protocol without IV contrast.

[Series 3: lung 1.00 · axial · 0.72mm/px · z∈[-1209,-924]mm · 12 of 315 slices shown, 15 images]
[im 15/315  mediastinal]
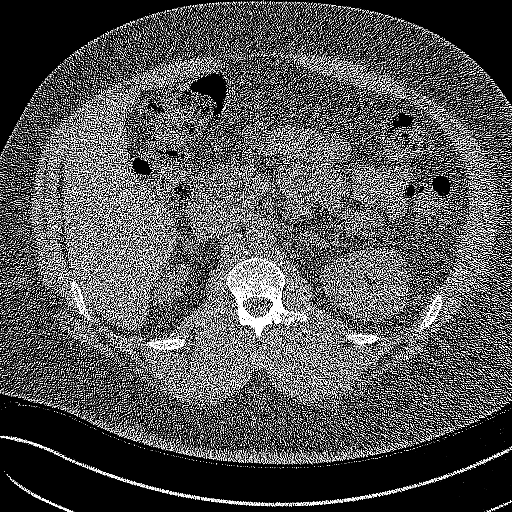
[im 15/315  lung]
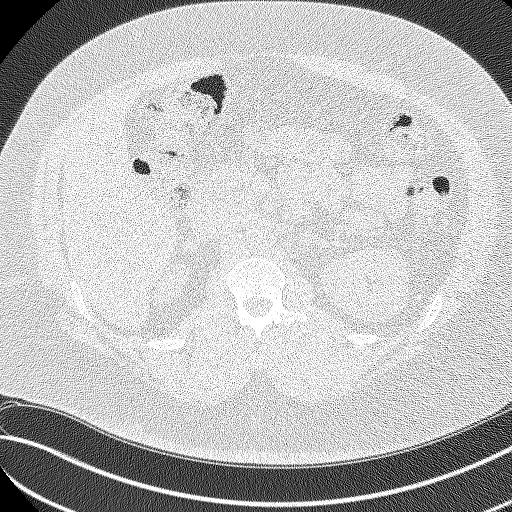
[im 43/315  lung]
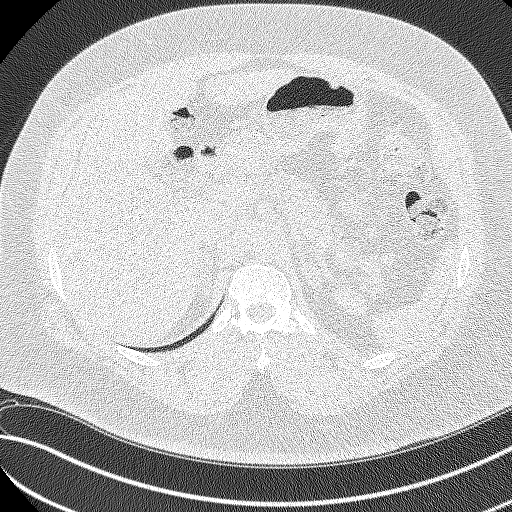
[im 72/315  lung]
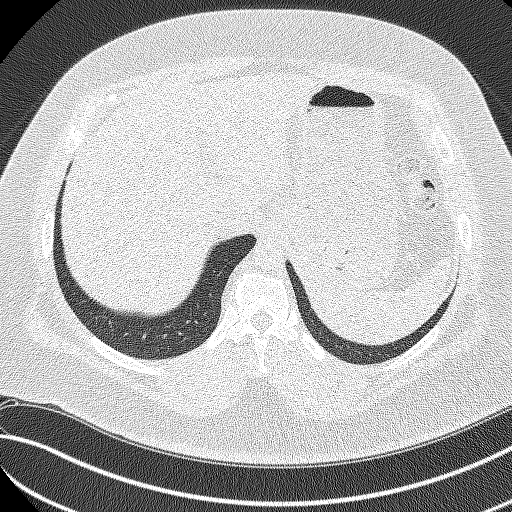
[im 100/315  lung]
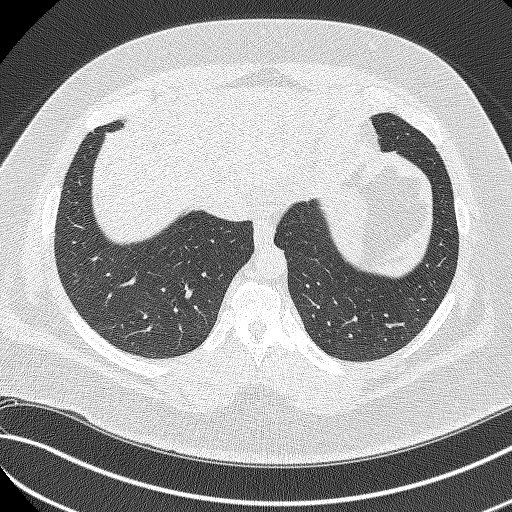
[im 115/315  mediastinal]
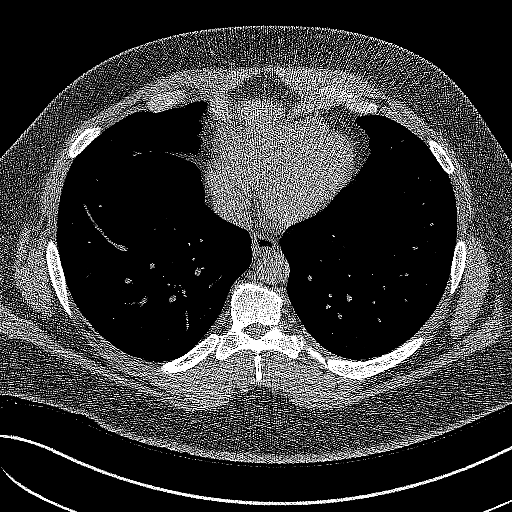
[im 115/315  lung]
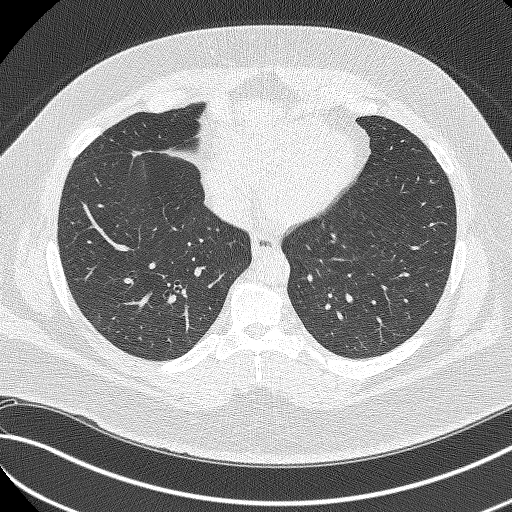
[im 143/315  lung]
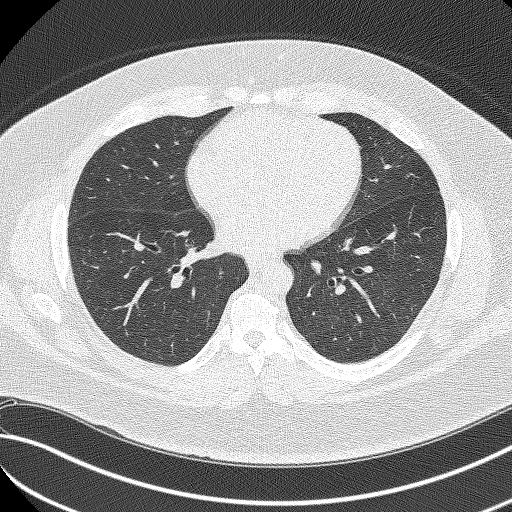
[im 172/315  lung]
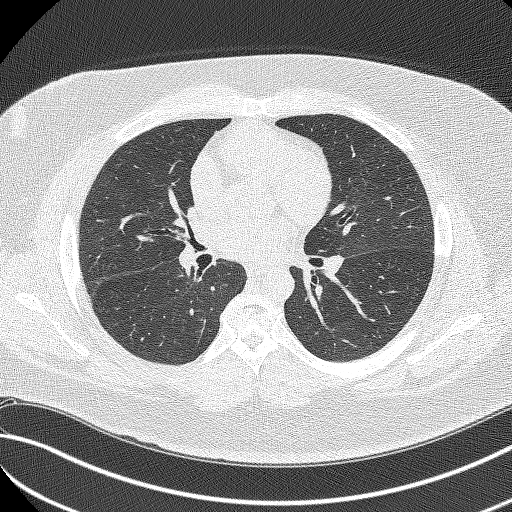
[im 200/315  lung]
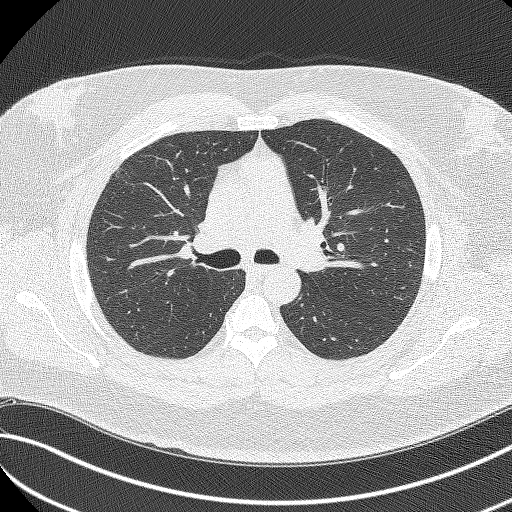
[im 215/315  mediastinal]
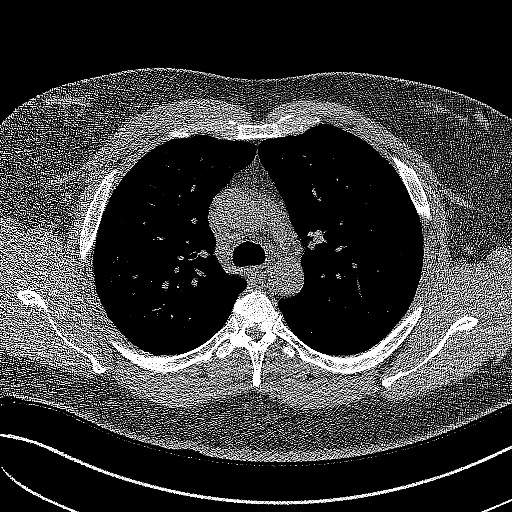
[im 215/315  lung]
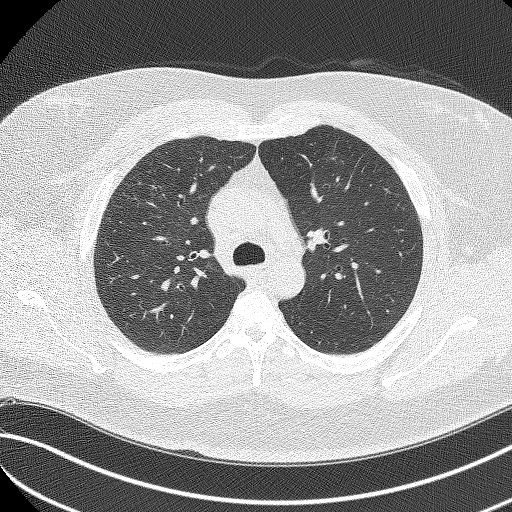
[im 243/315  lung]
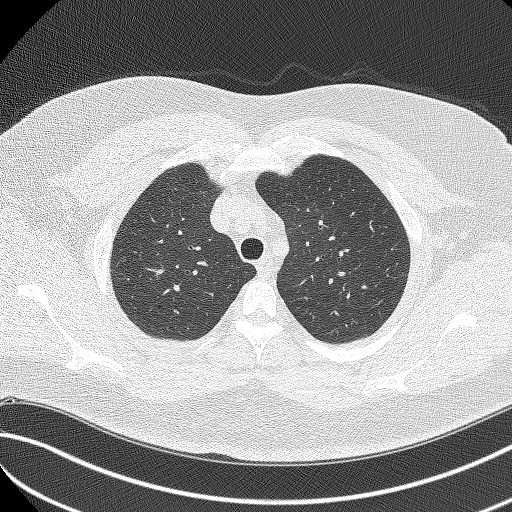
[im 272/315  lung]
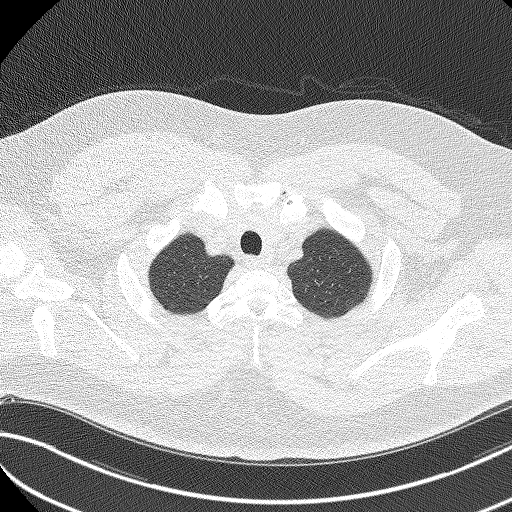
[im 300/315  lung]
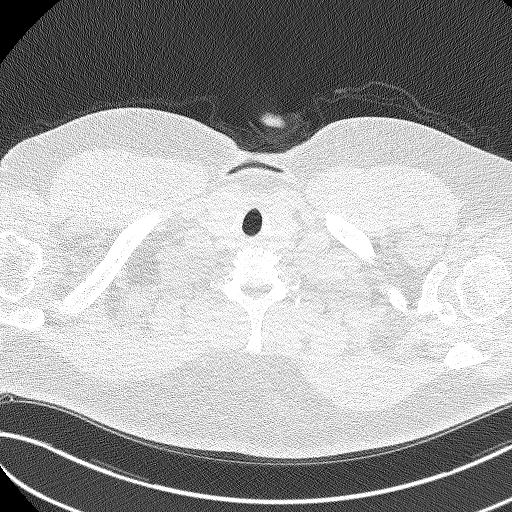

[Series 5: coronals lung 1.00 cor · coronal · 0.62mm/px · 3 of 332 slices shown]
[im 67/332  lung]
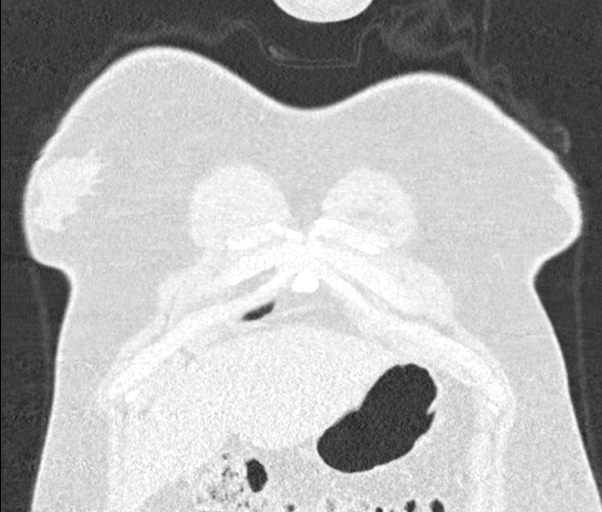
[im 133/332  lung]
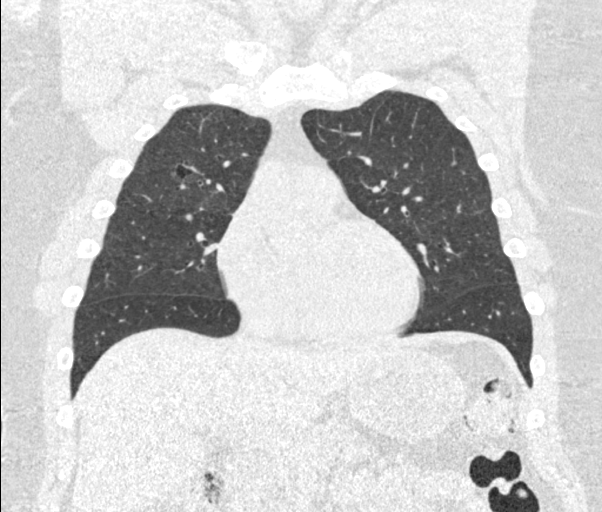
[im 199/332  lung]
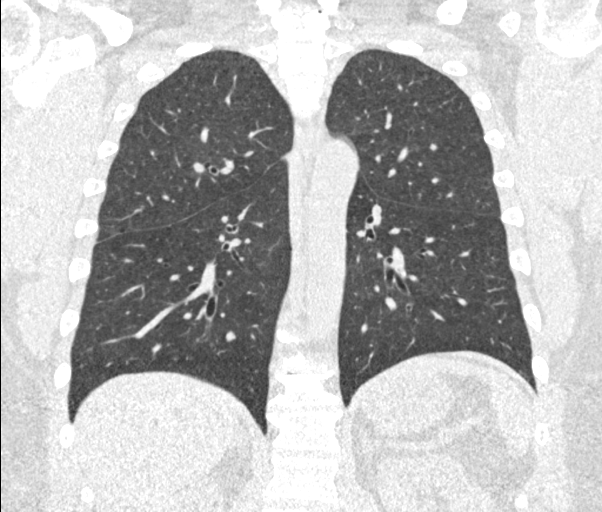

[15 of 40 positions shown; findings below may reference images not displayed]

FINDINGS: Cardiovascular: Normal aortic caliber. Normal heart size, without
pericardial effusion.

Mediastinum/Nodes: No mediastinal or definite hilar adenopathy,
given limitations of unenhanced CT.

Lungs/Pleura: No pleural fluid. Mild centrilobular and paraseptal
emphysema. Clear lungs.

Upper Abdomen: Normal imaged portions of the liver, spleen, stomach,
pancreas, adrenal glands, kidneys.

Musculoskeletal: Marked bilateral gynecomastia. Lower cervical and
midthoracic spondylosis.
IMPRESSION: 1. Lung-RADS 1, negative. Continue annual screening with low-dose
chest CT without contrast in 12 months.
2. Emphysema ([id number]).
3. Marked bilateral gynecomastia.

## 2023-10-22 ENCOUNTER — Encounter: Payer: Self-pay | Admitting: Nurse Practitioner

## 2023-10-23 MED ORDER — LUBIPROSTONE 24 MCG PO CAPS: 24.0000 ug | ORAL_CAPSULE | Freq: Two times a day (BID) | ORAL | 1 refills | Status: DC

## 2023-10-28 NOTE — Progress Notes (Unsigned)
Celso Amy, PA-C [address]  [address]  [place], [place] [address]  Main: [phone]  Fax: [phone]   Gastroenterology Consultation  Referring Provider:     Marjie Skiff, NP Primary Care Physician:  Marjie Skiff, NP Primary Gastroenterologist:  Celso Amy, PA-C / Dr. Wyline Mood   Reason for Consultation:     Constipation        HPI:   Lance Kim is a 53 y.o. y/o male referred for consultation & management  by Marjie Skiff, NP.    Here to evaluate constipation and sudden change in bowel habits since 06/2023.  He has associated bloating and nausea.  No prior history of constipation before 06/2023.  He is currently taking Amitiza 1 capsule twice daily and Senokot with benefit.  Currently having 1-2 Bms daily.  He denies rectal bleeding or weight loss.  Has occasional mild intermittent abdominal pain.  Denies rectal bleeding or family hx colon cancer.  No new meds.  10/16/2023 abdominal x-ray: Stool throughout the colon consistent with constipation.  10/29/2023: Normal TSH and free T4. 06/2023 normal CBC with hemoglobin 14.3.  08/2019 screening colonoscopy by Dr. Tobi Bastos: 1 small 5 mm tubular adenoma polyp removed from sigmoid colon.  Excellent prep.  Otherwise normal.  7-year repeat.   Past Medical History:  Diagnosis Date   Anxiety    Depression    Diabetes mellitus without complication (HCC)    Hyperlipidemia    Hypertension    Hypertensive renal disease without failure    Tobacco use disorder     Past Surgical History:  Procedure Laterality Date   COLONOSCOPY WITH PROPOFOL N/A 09/02/2019   Procedure: COLONOSCOPY WITH PROPOFOL;  Surgeon: Wyline Mood, MD;  Location: [facility] ENDOSCOPY;  Service: Gastroenterology;  Laterality: N/A;   WISDOM TOOTH EXTRACTION      Prior to Admission medications   Medication Sig Start Date End Date Taking? Authorizing Provider  amLODipine (NORVASC) 10 MG tablet TAKE 1 TABLET(10 MG) BY MOUTH DAILY  03/03/23   Cannady, Jolene T, NP  Apremilast 30 MG TABS Take 1 tablet by mouth 2 (two) times daily.    [provider]  aspirin EC 81 MG tablet Take 81 mg by mouth daily.    [provider]  atorvastatin (LIPITOR) 20 MG tablet TAKE 1 TABLET(20 MG) BY MOUTH DAILY 03/03/23   Cannady, Jolene T, NP  benzonatate (TESSALON PERLES) 100 MG capsule Take 1 capsule (100 mg total) by mouth 3 (three) times daily as needed. 07/03/23   Cannady, Corrie Dandy T, NP  cetirizine (ZYRTEC) 10 MG tablet TAKE 1 TABLET(10 MG) BY MOUTH DAILY 05/25/23   Cannady, Corrie Dandy T, NP  Cholecalciferol (VITAMIN D3) 1.25 MG (50000 UT) CAPS TAKE 1 CAPSULE BY MOUTH ONCE A WEEK 01/08/23   Cannady, Jolene T, NP  CONTOUR NEXT TEST test strip USE TO TEST SUGAR TWICE DAILY 09/04/20   Cannady, Jolene T, NP  desoximetasone (TOPICORT) 0.25 % cream Apply topically. 06/10/22   [provider]  FIBER ADULT GUMMIES PO Take 2 tablets by mouth daily.    [provider]  fluconazole (DIFLUCAN) 200 MG tablet Take 200 mg by mouth once a week. 08/25/23   [provider]  FLUoxetine (PROZAC) 20 MG capsule TAKE 1 CAPSULE(20 MG) BY MOUTH DAILY 03/03/23   Cannady, Jolene T, NP  fluticasone (FLONASE) 50 MCG/ACT nasal spray SHAKE LIQUID AND USE 2 SPRAYS IN EACH NOSTRIL TWICE DAILY 03/22/21   Marjie Skiff, NP  gabapentin (NEURONTIN) 600 MG tablet Take 0.5 tablets (300 mg total) by mouth at bedtime. 08/01/22   Cannady, Corrie Dandy T, NP  lisinopril-hydrochlorothiazide (ZESTORETIC) 20-12.5 MG tablet Take 1 tablet by mouth daily. 03/03/23   Cannady, Corrie Dandy T, NP  lubiprostone (AMITIZA) 24 MCG capsule Take 1 capsule (24 mcg total) by mouth 2 (two) times daily with a meal. 10/23/23   Cannady, Jolene T, NP  meloxicam (MOBIC) 7.5 MG tablet Take 1 tablet (7.5 mg total) by mouth daily. 07/03/23   Cannady, Corrie Dandy T, NP  methocarbamol (ROBAXIN) 500 MG tablet Take 1 tablet (500 mg total) by mouth every 8 (eight) hours as needed for muscle spasms.  08/01/22   Cannady, Corrie Dandy T, NP  Microlet Lancets MISC USE TO TEST SUGAR TWICE DAILY 06/20/20   Cannady, Jolene T, NP  mometasone (ELOCON) 0.1 % lotion Apply 1 Application topically daily. 12/04/22   [provider]  Multiple Vitamin (MULTIVITAMIN) tablet Take 2 tablets by mouth daily.    [provider]  ondansetron (ZOFRAN-ODT) 4 MG disintegrating tablet DISSOLVE 1 TABLET ON THE TONGUE EVERY 8 HOURS AS NEEDED FOR NAUSEA 03/22/21   Cannady, Corrie Dandy T, NP  Probiotic Product (PROBIOTIC PO) Take 1 tablet by mouth daily at 2 PM.    [provider]  Psyllium (METAMUCIL PO) Take by mouth.    [provider]  triamcinolone cream (KENALOG) 0.1 % Apply 1 application topically 2 (two) times daily. 03/22/21   Marjie Skiff, NP    Family History  Problem Relation Age of Onset   Diabetes Mother    Heart Problems Mother    Emphysema Father    Cancer Son    Cancer Maternal Grandmother    Prostate cancer Neg Hx    Bladder Cancer Neg Hx    Kidney cancer Neg Hx      Social History   Tobacco Use   Smoking status: Every Day    Current packs/day: 0.75    Average packs/day: 0.8 packs/day for 32.0 years (24.0 ttl pk-yrs)    Types: Cigarettes   Smokeless tobacco: Never   Tobacco comments:    .5ppd now  Vaping Use   Vaping status: Never Used  Substance Use Topics   Alcohol use: Yes    Alcohol/week: 2.0 standard drinks of alcohol    Types: 1 Glasses of wine, 1 Cans of beer per week    Comment: on occasion   Drug use: No    Allergies as of 10/29/2023 - Review Complete 10/29/2023  Allergen Reaction Noted   Mevacor [lovastatin] Other (See Comments) 12/28/2014    Review of Systems:    All systems reviewed and negative except where noted in HPI.   Physical Exam:  BP 115/70   Pulse 73   Temp 98.5 F (36.9 C)   Ht 5\' 2"  (1.575 m)   Wt 255 lb 6.4 oz (115.8 kg)   BMI 46.71 kg/m  No LMP for male patient.  General:   Alert,  Well-developed, well-nourished,  pleasant and cooperative in NAD Lungs:  Respirations even and unlabored.  Clear throughout to auscultation.   No wheezes, crackles, or rhonchi. No acute distress. Heart:  Regular rate and rhythm; no murmurs, clicks, rubs, or gallops. Abdomen:  Normal bowel sounds.  No bruits.  Soft, and non-distended without masses, hepatosplenomegaly or hernias noted.  No Tenderness.  No guarding or rebound tenderness.    Neurologic:  Alert and oriented x3;  grossly normal neurologically. Psych:  Alert and cooperative. Normal mood  and affect.  Imaging Studies: DG Abd 1 View Result Date: 10/22/2023 CLINICAL DATA:  Intermittent constipation. EXAM: ABDOMEN - 1 VIEW COMPARISON:  CT chest 09/14/2023 FINDINGS: Lung bases are clear. Stool throughout the colon. Nonobstructed bowel-gas pattern. No free air. Thoracic and lumbar spine degenerative changes. IMPRESSION: Stool throughout the colon as can be seen with constipation. Electronically Signed   By: Annia Belt M.D.   On: 10/22/2023 11:30    Assessment and Plan:   Lance Kim is a 53 y.o. y/o male has been referred for   1.  Constipation - Improved on Amitiza BID plus Senna. 2.  Sudden Change in Bowel Habits 2.  History of adenomatous colon polyp  Plan: -Continue Amitiza BID and Senna. -High Fiber Diet. -Drink 64 ounces fluids daily. -Scheduling Colonoscopy I discussed risks of colonoscopy with patient to include risk of bleeding, colon perforation, and risk of sedation.  Patient expressed understanding and agrees to proceed with colonoscopy.    Follow up As Needed based on Colonoscopy results and GI symptoms.  Celso Amy, PA-C

## 2023-10-29 ENCOUNTER — Ambulatory Visit: Payer: Managed Care, Other (non HMO) | Admitting: Physician Assistant

## 2023-10-29 ENCOUNTER — Encounter: Payer: Self-pay | Admitting: Physician Assistant

## 2023-10-29 VITALS — BP 115/70 | HR 73 | Temp 98.5°F | Ht 62.0 in | Wt 255.4 lb

## 2023-10-29 DIAGNOSIS — Z860100 Personal history of colon polyps, unspecified: Secondary | ICD-10-CM

## 2023-10-29 DIAGNOSIS — Z860101 Personal history of adenomatous and serrated colon polyps: Secondary | ICD-10-CM | POA: Diagnosis not present

## 2023-10-29 DIAGNOSIS — R194 Change in bowel habit: Secondary | ICD-10-CM

## 2023-10-29 DIAGNOSIS — K5900 Constipation, unspecified: Secondary | ICD-10-CM

## 2023-10-29 MED ORDER — NA SULFATE-K SULFATE-MG SULF 17.5-3.13-1.6 GM/177ML PO SOLN: 1.0000 | Freq: Once | ORAL | 0 refills | Status: AC

## 2023-10-30 ENCOUNTER — Telehealth: Payer: Self-pay

## 2023-10-30 NOTE — Telephone Encounter (Signed)
No prior auth for [id number]     Cancel [id number] ORDER IN ERROR Pending for 13086  [id number] [employer] [phone] [id number] case number

## 2023-10-30 NOTE — Telephone Encounter (Signed)
Per Rosann Auerbach / [employer] EGD is not covered. Would you like to cancel EGD or do a peer to peer? [phone]

## 2023-10-31 NOTE — Patient Instructions (Signed)

## 2023-11-02 ENCOUNTER — Encounter: Payer: Self-pay | Admitting: Nurse Practitioner

## 2023-11-02 ENCOUNTER — Ambulatory Visit: Payer: Managed Care, Other (non HMO) | Admitting: Nurse Practitioner

## 2023-11-02 VITALS — BP 121/75 | HR 69 | Temp 98.9°F | Ht 62.0 in | Wt 253.8 lb

## 2023-11-02 DIAGNOSIS — K5901 Slow transit constipation: Secondary | ICD-10-CM | POA: Diagnosis not present

## 2023-11-02 NOTE — Assessment & Plan Note (Signed)
Improving at this time with Lubiprostone and Senna S.  Having 2 BM a day.  Will continue these and collaboration with GI.  Scheduled for colonoscopy.  Appetite remains low, but is passing bowels regular.

## 2023-11-02 NOTE — Progress Notes (Signed)
BP 121/75   Pulse 69   Temp 98.9 F (37.2 C) (Oral)   Ht 5\' 2"  (1.575 m)   Wt 253 lb 12.8 oz (115.1 kg)   SpO2 99%   BMI 46.42 kg/m    Subjective:    Patient ID: Lance Kim, male    DOB: 09-11-1971, 53 y.o.   MRN: [id number]  HPI: Lance Kim is a 53 y.o. male  Chief Complaint  Patient presents with   Constipation   CONSTIPATION Follow-up today for constipation. Did have KUB which noted stool throughout colon. Taking Lubiprostone which is helping him have BM twice a day.  Also taking Senna S at night.  Reports bloating has improved and not as hard to use bathroom as was.  Does take Zofran for nausea with Otezla.  Appetite continues to be less then normal.  Saw GI on 10/29/23 and is to have colonoscopy on 11/26/23.    No family history of colon cancer.  Continues to smoke nicotine.  Minimal alcohol use. Does smoke MJ, but is reducing this. Duration:months Alleviating factors: Lubiprostone and Senna S Aggravating factors: unknown Status: improving Treatments attempted: Senna S, Linzess, Lubiprostone, Metamucil, Senna Fever: no Nausea: no Vomiting:no Weight loss: no Decreased appetite: yes Diarrhea: no Constipation: improving, now 2 BM daily Blood in stool: no Heartburn: no Jaundice: no Rash: no  Relevant past medical, surgical, family and social history reviewed and updated as indicated. Interim medical history since our last visit reviewed. Allergies and medications reviewed and updated.  Review of Systems  Constitutional:  Positive for appetite change (ongoing >1 month). Negative for activity change, chills, fatigue and fever.  Respiratory:  Negative for cough, chest tightness, shortness of breath and wheezing.   Cardiovascular:  Negative for chest pain, palpitations and leg swelling.  Gastrointestinal:  Negative for abdominal distention, abdominal pain, blood in stool, constipation, diarrhea, nausea, rectal pain and vomiting.  Psychiatric/Behavioral:  Negative.     Per HPI unless specifically indicated above     Objective:    BP 121/75   Pulse 69   Temp 98.9 F (37.2 C) (Oral)   Ht 5\' 2"  (1.575 m)   Wt 253 lb 12.8 oz (115.1 kg)   SpO2 99%   BMI 46.42 kg/m   Wt Readings from Last 3 Encounters:  11/02/23 253 lb 12.8 oz (115.1 kg)  10/29/23 255 lb 6.4 oz (115.8 kg)  10/13/23 251 lb 12.8 oz (114.2 kg)    Physical Exam Vitals and nursing note reviewed.  Constitutional:      General: He is awake. He is not in acute distress.    Appearance: He is well-developed and well-groomed. He is obese. He is not ill-appearing or toxic-appearing.  HENT:     Head: Normocephalic.     Right Ear: Hearing and external ear normal.     Left Ear: Hearing and external ear normal.  Eyes:     General: Lids are normal.     Extraocular Movements: Extraocular movements intact.     Conjunctiva/sclera: Conjunctivae normal.  Neck:     Thyroid: No thyromegaly.     Vascular: No carotid bruit.  Cardiovascular:     Rate and Rhythm: Normal rate and regular rhythm.     Heart sounds: Normal heart sounds. No murmur heard.    No gallop.  Pulmonary:     Effort: Pulmonary effort is normal. No accessory muscle usage or respiratory distress.     Breath sounds: Normal breath sounds. No decreased breath sounds,  wheezing or rhonchi.  Abdominal:     General: Bowel sounds are normal. There is no distension.     Palpations: Abdomen is soft. There is no mass.     Tenderness: There is no abdominal tenderness. There is no right CVA tenderness or left CVA tenderness.  Musculoskeletal:     Cervical back: Full passive range of motion without pain.     Right lower leg: No edema.     Left lower leg: No edema.  Lymphadenopathy:     Cervical: No cervical adenopathy.  Skin:    General: Skin is warm.     Capillary Refill: Capillary refill takes less than 2 seconds.  Neurological:     Mental Status: He is alert and oriented to person, place, and time.     Deep Tendon  Reflexes: Reflexes are normal and symmetric.     Reflex Scores:      Brachioradialis reflexes are 2+ on the right side and 2+ on the left side.      Patellar reflexes are 2+ on the right side and 2+ on the left side. Psychiatric:        Attention and Perception: Attention normal.        Mood and Affect: Mood normal.        Speech: Speech normal.        Behavior: Behavior normal. Behavior is cooperative.        Thought Content: Thought content normal.     Results for orders placed or performed in visit on 09/24/23  TSH   Collection Time: 09/24/23  9:48 AM  Result Value Ref Range   TSH 0.608 0.450 - 4.500 uIU/mL  T4, free   Collection Time: 09/24/23  9:48 AM  Result Value Ref Range   Free T4 1.12 0.82 - 1.77 ng/dL      Assessment & Plan:   Problem List Items Addressed This Visit       Digestive   Slow transit constipation - Primary   Improving at this time with Lubiprostone and Senna S.  Having 2 BM a day.  Will continue these and collaboration with GI.  Scheduled for colonoscopy.  Appetite remains low, but is passing bowels regular.         Follow up plan: Return for as scheduled in April.

## 2023-12-03 ENCOUNTER — Other Ambulatory Visit: Payer: Self-pay

## 2023-12-03 ENCOUNTER — Encounter: Payer: Self-pay | Admitting: Gastroenterology

## 2023-12-09 ENCOUNTER — Encounter: Payer: Self-pay | Admitting: Gastroenterology

## 2023-12-10 ENCOUNTER — Encounter
Admission: RE | Disposition: A | Discharge status: Home / Self Care | Payer: Self-pay | Source: Home / Self Care | Attending: Gastroenterology

## 2023-12-10 ENCOUNTER — Ambulatory Visit: Admitting: Anesthesiology

## 2023-12-10 ENCOUNTER — Other Ambulatory Visit: Payer: Self-pay

## 2023-12-10 ENCOUNTER — Encounter: Payer: Self-pay | Admitting: Gastroenterology

## 2023-12-10 ENCOUNTER — Ambulatory Visit
Admission: RE | Admit: 2023-12-10 | Discharge: 2023-12-10 | Disposition: A | Discharge status: Home / Self Care | Payer: Managed Care, Other (non HMO) | Attending: Gastroenterology | Admitting: Gastroenterology

## 2023-12-10 DIAGNOSIS — D126 Benign neoplasm of colon, unspecified: Secondary | ICD-10-CM

## 2023-12-10 DIAGNOSIS — Z6841 Body Mass Index (BMI) 40.0 and over, adult: Secondary | ICD-10-CM | POA: Insufficient documentation

## 2023-12-10 DIAGNOSIS — D122 Benign neoplasm of ascending colon: Secondary | ICD-10-CM | POA: Insufficient documentation

## 2023-12-10 DIAGNOSIS — E6689 Other obesity not elsewhere classified: Secondary | ICD-10-CM | POA: Diagnosis not present

## 2023-12-10 DIAGNOSIS — Z1211 Encounter for screening for malignant neoplasm of colon: Secondary | ICD-10-CM | POA: Diagnosis present

## 2023-12-10 DIAGNOSIS — E1122 Type 2 diabetes mellitus with diabetic chronic kidney disease: Secondary | ICD-10-CM | POA: Diagnosis not present

## 2023-12-10 DIAGNOSIS — R194 Change in bowel habit: Secondary | ICD-10-CM

## 2023-12-10 DIAGNOSIS — F17210 Nicotine dependence, cigarettes, uncomplicated: Secondary | ICD-10-CM | POA: Insufficient documentation

## 2023-12-10 DIAGNOSIS — Z7984 Long term (current) use of oral hypoglycemic drugs: Secondary | ICD-10-CM | POA: Insufficient documentation

## 2023-12-10 DIAGNOSIS — Z860100 Personal history of colon polyps, unspecified: Secondary | ICD-10-CM

## 2023-12-10 DIAGNOSIS — I129 Hypertensive chronic kidney disease with stage 1 through stage 4 chronic kidney disease, or unspecified chronic kidney disease: Secondary | ICD-10-CM | POA: Insufficient documentation

## 2023-12-10 HISTORY — DX: Other cervical disc degeneration, unspecified cervical region: M50.30

## 2023-12-10 HISTORY — DX: Psoriasis, unspecified: L40.9

## 2023-12-10 HISTORY — DX: Prediabetes: R73.03

## 2023-12-10 LAB — GLUCOSE, CAPILLARY: Glucose-Capillary: 112 mg/dL — ABNORMAL HIGH (ref 70–99)

## 2023-12-10 SURGERY — COLONOSCOPY WITH PROPOFOL
Anesthesia: General

## 2023-12-10 MED ORDER — PROPOFOL 10 MG/ML IV BOLUS
INTRAVENOUS | Status: DC | PRN
Start: 2023-12-10 — End: 2023-12-10
  Administered 2023-12-10: 20 mg via INTRAVENOUS
  Administered 2023-12-10: 50 mg via INTRAVENOUS
  Administered 2023-12-10: 40 mg via INTRAVENOUS
  Administered 2023-12-10 (×7): 20 mg via INTRAVENOUS

## 2023-12-10 MED ORDER — LIDOCAINE HCL (PF) 2 % IJ SOLN
INTRAMUSCULAR | Status: DC | PRN
  Administered 2023-12-10: 40 mg via INTRADERMAL

## 2023-12-10 MED ORDER — SODIUM CHLORIDE 0.9 % IV SOLN
INTRAVENOUS | Status: DC
Start: 2023-12-10 — End: 2023-12-10

## 2023-12-10 NOTE — Transfer of Care (Signed)
 Immediate Anesthesia Transfer of Care Note  Patient: Lance Kim  Procedure(s) Performed: COLONOSCOPY WITH PROPOFOL POLYPECTOMY  Patient Location: PACU and Endoscopy Unit  Anesthesia Type:MAC  Level of Consciousness: drowsy  Airway & Oxygen Therapy: Patient Spontanous Breathing and Patient connected to nasal cannula oxygen  Post-op Assessment: Report given to RN and Post -op Vital signs reviewed and stable  Post vital signs: Reviewed and stable  Last Vitals:  Vitals Value Taken Time  BP 98/57   Temp    Pulse 67 12/10/23 1106  Resp 16 12/10/23 1106  SpO2 96 % 12/10/23 1106  Vitals shown include unfiled device data.  Last Pain:  Vitals:   12/10/23 1009  TempSrc: Temporal  PainSc: 0-No pain         Complications: No notable events documented.

## 2023-12-10 NOTE — H&P (Signed)
 Wyline Mood, MD [address], [address], [place], [place], [address] [address], [place], [place], [address] Phone: [phone]  Fax: [phone]  Primary Care Physician:  Marjie Skiff, NP   Pre-Procedure History & Physical: HPI:  Lance Kim is a 53 y.o. male is here for an colonoscopy.   Past Medical History:  Diagnosis Date   Anxiety    DDD (degenerative disc disease), cervical    Depression    Hyperlipidemia    Hypertension    Hypertensive renal disease without failure    Pre-diabetes    Psoriasis    Tobacco use disorder     Past Surgical History:  Procedure Laterality Date   COLONOSCOPY WITH PROPOFOL N/A 09/02/2019   Procedure: COLONOSCOPY WITH PROPOFOL;  Surgeon: Wyline Mood, MD;  Location: [facility] ENDOSCOPY;  Service: Gastroenterology;  Laterality: N/A;   WISDOM TOOTH EXTRACTION      Prior to Admission medications   Medication Sig Start Date End Date Taking? Authorizing Provider  amLODipine (NORVASC) 10 MG tablet TAKE 1 TABLET(10 MG) BY MOUTH DAILY 03/03/23  Yes Cannady, Jolene T, NP  Apremilast 30 MG TABS Take 1 tablet by mouth 2 (two) times daily.   Yes [provider]  aspirin EC 81 MG tablet Take 81 mg by mouth daily.   Yes [provider]  atorvastatin (LIPITOR) 20 MG tablet TAKE 1 TABLET(20 MG) BY MOUTH DAILY 03/03/23  Yes Cannady, Jolene T, NP  cetirizine (ZYRTEC) 10 MG tablet TAKE 1 TABLET(10 MG) BY MOUTH DAILY 05/25/23  Yes Cannady, Jolene T, NP  Cholecalciferol (VITAMIN D3) 1.25 MG (50000 UT) CAPS TAKE 1 CAPSULE BY MOUTH ONCE A WEEK 01/08/23  Yes Cannady, Jolene T, NP  CONTOUR NEXT TEST test strip USE TO TEST SUGAR TWICE DAILY 09/04/20  Yes Cannady, Jolene T, NP  desoximetasone (TOPICORT) 0.25 % cream Apply topically. 06/10/22  Yes [provider]  docusate sodium (COLACE) 100 MG capsule Take 100 mg by mouth 2 (two) times daily.   Yes [provider]  FIBER ADULT GUMMIES PO Take 2 tablets by mouth daily.    Yes [provider]  fluconazole (DIFLUCAN) 200 MG tablet Take 200 mg by mouth once a week. 08/25/23  Yes [provider]  FLUoxetine (PROZAC) 20 MG capsule TAKE 1 CAPSULE(20 MG) BY MOUTH DAILY 03/03/23  Yes Cannady, Jolene T, NP  fluticasone (FLONASE) 50 MCG/ACT nasal spray SHAKE LIQUID AND USE 2 SPRAYS IN EACH NOSTRIL TWICE DAILY 03/22/21  Yes Cannady, Jolene T, NP  gabapentin (NEURONTIN) 600 MG tablet Take 0.5 tablets (300 mg total) by mouth at bedtime. 08/01/22  Yes Cannady, Jolene T, NP  lisinopril-hydrochlorothiazide (ZESTORETIC) 20-12.5 MG tablet Take 1 tablet by mouth daily. 03/03/23  Yes Cannady, Jolene T, NP  lubiprostone (AMITIZA) 24 MCG capsule Take 1 capsule (24 mcg total) by mouth 2 (two) times daily with a meal. 10/23/23  Yes Cannady, Jolene T, NP  meloxicam (MOBIC) 7.5 MG tablet Take 1 tablet (7.5 mg total) by mouth daily. 07/03/23  Yes Cannady, Jolene T, NP  methocarbamol (ROBAXIN) 500 MG tablet Take 1 tablet (500 mg total) by mouth every 8 (eight) hours as needed for muscle spasms. 08/01/22  Yes Cannady, Jolene T, NP  Microlet Lancets MISC USE TO TEST SUGAR TWICE DAILY 06/20/20  Yes Cannady, Jolene T, NP  mometasone (ELOCON) 0.1 % lotion Apply 1 Application topically daily. 12/04/22  Yes [provider]  Multiple Vitamin (MULTIVITAMIN) tablet Take 2 tablets by mouth daily.  Yes [provider]  ondansetron (ZOFRAN-ODT) 4 MG disintegrating tablet DISSOLVE 1 TABLET ON THE TONGUE EVERY 8 HOURS AS NEEDED FOR NAUSEA 03/22/21  Yes Cannady, Jolene T, NP  Probiotic Product (PROBIOTIC PO) Take 1 tablet by mouth daily at 2 PM.   Yes [provider]  Psyllium (METAMUCIL PO) Take by mouth.   Yes [provider]  sennosides-docusate sodium (SENOKOT-S) 8.6-50 MG tablet Take 1 tablet by mouth daily.   Yes [provider]  triamcinolone cream (KENALOG) 0.1 % Apply 1 application topically 2 (two) times daily. 03/22/21  Yes Cannady, Jolene T, NP   benzonatate (TESSALON PERLES) 100 MG capsule Take 1 capsule (100 mg total) by mouth 3 (three) times daily as needed. 07/03/23   Aura Dials T, NP    Allergies as of 10/29/2023 - Review Complete 10/29/2023  Allergen Reaction Noted   Mevacor [lovastatin] Other (See Comments) 12/28/2014    Family History  Problem Relation Age of Onset   Diabetes Mother    Heart Problems Mother    Emphysema Father    Cancer Son    Cancer Maternal Grandmother    Prostate cancer Neg Hx    Bladder Cancer Neg Hx    Kidney cancer Neg Hx     Social History   Socioeconomic History   Marital status: Legally Separated    Spouse name: Not on file   Number of children: Not on file   Years of education: Not on file   Highest education level: Not on file  Occupational History   Not on file  Tobacco Use   Smoking status: Every Day    Current packs/day: 0.75    Average packs/day: 0.8 packs/day for 32.0 years (24.0 ttl pk-yrs)    Types: Cigarettes   Smokeless tobacco: Never   Tobacco comments:    .5ppd now  Vaping Use   Vaping status: Never Used  Substance and Sexual Activity   Alcohol use: Yes    Alcohol/week: 2.0 standard drinks of alcohol    Types: 1 Glasses of wine, 1 Cans of beer per week    Comment: on occasion   Drug use: Yes    Types: Marijuana    Comment: 12/09/23   Sexual activity: Yes    Partners: Female    Comment: satidfied with sexual function  Other Topics Concern   Not on file  Social History Narrative   Not on file   Social Drivers of Health   Financial Resource Strain: Low Risk  (05/28/2021)   Overall Financial Resource Strain (CARDIA)    Difficulty of Paying Living Expenses: Not very hard  Food Insecurity: No Food Insecurity (05/28/2021)   Hunger Vital Sign    Worried About Running Out of Food in the Last Year: Never true    Ran Out of Food in the Last Year: Never true  Transportation Needs: No Transportation Needs (05/28/2021)   PRAPARE - Scientist, research (physical sciences) (Medical): No    Lack of Transportation (Non-Medical): No  Physical Activity: Inactive (05/28/2021)   Exercise Vital Sign    Days of Exercise per Week: 0 days    Minutes of Exercise per Session: 0 min  Stress: No Stress Concern Present (05/28/2021)   [employer] of Occupational Health - Occupational Stress Questionnaire    Feeling of Stress : Not at all  Social Connections: Not on file  Intimate Partner Violence: Not At Risk (05/28/2021)   Humiliation, Afraid, Rape, and Kick questionnaire  Fear of Current or Ex-Partner: No    Emotionally Abused: No    Physically Abused: No    Sexually Abused: No    Review of Systems: See HPI, otherwise negative ROS  Physical Exam: BP 113/76   Pulse 60   Temp (!) 96.5 F (35.8 C) (Temporal)   Resp 16   Ht 5\' 2"  (1.575 m)   Wt 109.8 kg   SpO2 100%   BMI 44.26 kg/m  General:   Alert,  pleasant and cooperative in NAD Head:  Normocephalic and atraumatic. Neck:  Supple; no masses or thyromegaly. Lungs:  Clear throughout to auscultation, normal respiratory effort.    Heart:  +S1, +S2, Regular rate and rhythm, No edema. Abdomen:  Soft, nontender and nondistended. Normal bowel sounds, without guarding, and without rebound.   Neurologic:  Alert and  oriented x4;  grossly normal neurologically.  Impression/Plan: Lance Kim is here for an colonoscopy to be performed for surveillance due to prior history of colon polyps   Risks, benefits, limitations, and alternatives regarding  colonoscopy have been reviewed with the patient.  Questions have been answered.  All parties agreeable.   Wyline Mood, MD  12/10/2023, 10:15 AM

## 2023-12-10 NOTE — Anesthesia Preprocedure Evaluation (Signed)
 Anesthesia Evaluation  Patient identified by MRN, date of birth, ID band Patient awake    Reviewed: Allergy & Precautions, NPO status , Patient's Chart, lab work & pertinent test results  Airway Mallampati: II  TM Distance: >3 FB Neck ROM: full    Dental  (+) Teeth Intact   Pulmonary neg pulmonary ROS, sleep apnea , COPD, Current Smoker   Pulmonary exam normal  + decreased breath sounds      Cardiovascular Exercise Tolerance: Good hypertension, Pt. on medications negative cardio ROS Normal cardiovascular exam Rhythm:Regular Rate:Normal     Neuro/Psych   Anxiety     negative neurological ROS  negative psych ROS   GI/Hepatic negative GI ROS, Neg liver ROS,,,  Endo/Other  negative endocrine ROSdiabetes, Oral Hypoglycemic Agents  Class 4 obesity  Renal/GU negative Renal ROS  negative genitourinary   Musculoskeletal  (+) Arthritis ,    Abdominal  (+) + obese  Peds negative pediatric ROS (+)  Hematology negative hematology ROS (+)   Anesthesia Other Findings Past Medical History: No date: Anxiety No date: DDD (degenerative disc disease), cervical No date: Depression No date: Hyperlipidemia No date: Hypertension No date: Hypertensive renal disease without failure No date: Pre-diabetes No date: Psoriasis No date: Tobacco use disorder  Past Surgical History: 09/02/2019: COLONOSCOPY WITH PROPOFOL; N/A     Comment:  Procedure: COLONOSCOPY WITH PROPOFOL;  Surgeon: Wyline Mood, MD;  Location: [facility] ENDOSCOPY;  Service:               Gastroenterology;  Laterality: N/A; No date: WISDOM TOOTH EXTRACTION  BMI    Body Mass Index: 44.26 kg/m      Reproductive/Obstetrics negative OB ROS                             Anesthesia Physical Anesthesia Plan  ASA: 3  Anesthesia Plan: General   Post-op Pain Management:    Induction: Intravenous  PONV Risk Score and Plan: Propofol  infusion and TIVA  Airway Management Planned: Natural Airway and Nasal Cannula  Additional Equipment:   Intra-op Plan:   Post-operative Plan:   Informed Consent: I have reviewed the patients History and Physical, chart, labs and discussed the procedure including the risks, benefits and alternatives for the proposed anesthesia with the patient or authorized representative who has indicated his/her understanding and acceptance.     Dental Advisory Given  Plan Discussed with: CRNA  Anesthesia Plan Comments:        Anesthesia Quick Evaluation

## 2023-12-10 NOTE — Op Note (Signed)
 [facility] Gastroenterology Patient Name: Lance Kim Procedure Date: 12/10/2023 10:31 AM MRN: [id number] Account #: [id number] Date of Birth: 1970-12-17 Admit Type: Outpatient Age: 53 Room: [facility] ENDO ROOM 2 Gender: Male Note Status: Finalized Instrument Name: Nelda Marseille [id number] Procedure:             Colonoscopy Indications:           Surveillance: Personal history of adenomatous polyps                         on last colonoscopy 5 years ago Providers:             Wyline Mood MD, MD Referring MD:          Wyline Mood MD, MD (Referring MD), Dorie Rank. Harvest Dark                         (Referring MD) Medicines:             Monitored Anesthesia Care Complications:         No immediate complications. Procedure:             Pre-Anesthesia Assessment:                        - Prior to the procedure, a History and Physical was                         performed, and patient medications, allergies and                         sensitivities were reviewed. The patient's tolerance                         of previous anesthesia was reviewed.                        - The risks and benefits of the procedure and the                         sedation options and risks were discussed with the                         patient. All questions were answered and informed                         consent was obtained.                        - ASA Grade Assessment: II - A patient with mild                         systemic disease.                        After obtaining informed consent, the colonoscope was                         passed under direct vision. Throughout the procedure,                         the patient's  blood pressure, pulse, and oxygen                         saturations were monitored continuously. The                         Colonoscope was introduced through the anus and                         advanced to the the cecum, identified by the                          appendiceal orifice. The colonoscopy was performed                         with ease. The patient tolerated the procedure well.                         The quality of the bowel preparation was excellent.                         The ileocecal valve, appendiceal orifice, and rectum                         were photographed. Findings:      The perianal and digital rectal examinations were normal.      A 5 mm polyp was found in the ascending colon. The polyp was sessile.       The polyp was removed with a cold snare. Resection and retrieval were       complete.      The exam was otherwise without abnormality on direct and retroflexion       views. Impression:            - One 5 mm polyp in the ascending colon, removed with                         a cold snare. Resected and retrieved.                        - The examination was otherwise normal on direct and                         retroflexion views. Recommendation:        - Discharge patient to home (with escort).                        - Resume previous diet.                        - Continue present medications.                        - Await pathology results.                        - Repeat colonoscopy for surveillance based on                         pathology results. Procedure Code(s):     --- Professional ---  57846, Colonoscopy, flexible; with removal of                         tumor(s), polyp(s), or other lesion(s) by snare                         technique Diagnosis Code(s):     --- Professional ---                        Z86.010, Personal history of colonic polyps                        D12.2, Benign neoplasm of ascending colon CPT copyright 2022 American Medical Association. All rights reserved. The codes documented in this report are preliminary and upon coder review may  be revised to meet current compliance requirements. Wyline Mood, MD Wyline Mood MD, MD 12/10/2023 11:05:14 AM This report has been  signed electronically. Number of Addenda: 0 Note Initiated On: 12/10/2023 10:31 AM Scope Withdrawal Time: 0 hours 12 minutes 52 seconds  Total Procedure Duration: 0 hours 19 minutes 41 seconds  Estimated Blood Loss:  Estimated blood loss: none.      [facility]

## 2023-12-10 NOTE — Anesthesia Postprocedure Evaluation (Signed)
 Anesthesia Post Note  Patient: Lance Kim  Procedure(s) Performed: COLONOSCOPY WITH PROPOFOL POLYPECTOMY  Patient location during evaluation: PACU Anesthesia Type: General Level of consciousness: awake Pain management: satisfactory to patient Vital Signs Assessment: post-procedure vital signs reviewed and stable Respiratory status: nonlabored ventilation and respiratory function stable Cardiovascular status: blood pressure returned to baseline Anesthetic complications: no   No notable events documented.   Last Vitals:  Vitals:   12/10/23 1009 12/10/23 1107  BP: 113/76 98/66  Pulse: 60   Resp: 16   Temp: (!) 35.8 C (!) 35.8 C  SpO2: 100%     Last Pain:  Vitals:   12/10/23 1107  TempSrc: Temporal  PainSc: 0-No pain                 VAN STAVEREN,Willadene Mounsey

## 2023-12-11 ENCOUNTER — Encounter: Payer: Self-pay | Admitting: Gastroenterology

## 2023-12-11 LAB — SURGICAL PATHOLOGY

## 2023-12-15 ENCOUNTER — Encounter: Payer: Self-pay | Admitting: Gastroenterology

## 2024-01-02 NOTE — Patient Instructions (Signed)
 Be Involved in Caring For Your Health:  Taking Medications When medications are taken as directed, they can greatly improve your health. But if they are not taken as prescribed, they may not work. In some cases, not taking them correctly can be harmful. To help ensure your treatment remains effective and safe, understand your medications and how to take them. Bring your medications to each visit for review by your provider.  Your lab results, notes, and after visit summary will be available on My Chart. We strongly encourage you to use this feature. If lab results are abnormal the clinic will contact you with the appropriate steps. If the clinic does not contact you assume the results are satisfactory. You can always view your results on My Chart. If you have questions regarding your health or results, please contact the clinic during office hours. You can also ask questions on My Chart.  We at [facility] are grateful that you chose [place] to provide your care. We strive to provide evidence-based and compassionate care and are always looking for feedback. If you get a survey from the clinic please complete this so we can hear your opinions.  Diabetes Mellitus and Foot Care Diabetes, also called diabetes mellitus, may cause problems with your feet and legs because of poor blood flow (circulation). Poor circulation may make your skin: Become thinner and drier. Break more easily. Heal more slowly. Peel and crack. You may also have nerve damage (neuropathy). This can cause decreased feeling in your legs and feet. This means that you may not notice minor injuries to your feet that could lead to more serious problems. Finding and treating problems early is the best way to prevent future foot problems. How to care for your feet Foot hygiene  Wash your feet daily with warm water and mild soap. Do not use hot water. Then, pat your feet and the areas between your toes until they are fully dry. Do  not soak your feet. This can dry your skin. Trim your toenails straight across. Do not dig under them or around the cuticle. File the edges of your nails with an emery board or nail file. Apply a moisturizing lotion or petroleum jelly to the skin on your feet and to dry, brittle toenails. Use lotion that does not contain alcohol and is unscented. Do not apply lotion between your toes. Shoes and socks Wear clean socks or stockings every day. Make sure they are not too tight. Do not wear knee-high stockings. These may decrease blood flow to your legs. Wear shoes that fit well and have enough cushioning. Always look in your shoes before you put them on to be sure there are no objects inside. To break in new shoes, wear them for just a few hours a day. This prevents injuries on your feet. Wounds, scrapes, corns, and calluses  Check your feet daily for blisters, cuts, bruises, sores, and redness. If you cannot see the bottom of your feet, use a mirror or ask someone for help. Do not cut off corns or calluses or try to remove them with medicine. If you find a minor scrape, cut, or break in the skin on your feet, keep it and the skin around it clean and dry. You may clean these areas with mild soap and water. Do not clean the area with peroxide, alcohol, or iodine. If you have a wound, scrape, corn, or callus on your foot, look at it several times a day to make sure it  is healing and not infected. Check for: Redness, swelling, or pain. Fluid or blood. Warmth. Pus or a bad smell. General tips Do not cross your legs. This may decrease blood flow to your feet. Do not use heating pads or hot water bottles on your feet. They may burn your skin. If you have lost feeling in your feet or legs, you may not know this is happening until it is too late. Protect your feet from hot and cold by wearing shoes, such as at the beach or on hot pavement. Schedule a complete foot exam at least once a year or more often if  you have foot problems. Report any cuts, sores, or bruises to your health care provider right away. Where to find more information American Diabetes Association: diabetes.org Association of Diabetes Care & Education Specialists: diabeteseducator.org Contact a health care provider if: You have a condition that increases your risk of infection, and you have any cuts, sores, or bruises on your feet. You have an injury that is not healing. You have redness on your legs or feet. You feel burning or tingling in your legs or feet. You have pain or cramps in your legs and feet. Your legs or feet are numb. Your feet always feel cold. You have pain around any toenails. Get help right away if: You have a wound, scrape, corn, or callus on your foot and: You have signs of infection. You have a fever. You have a red line going up your leg. This information is not intended to replace advice given to you by your health care provider. Make sure you discuss any questions you have with your health care provider. Document Revised: 03/05/2022 Document Reviewed: 03/05/2022 Elsevier Patient Education  2024 [employer].

## 2024-01-04 ENCOUNTER — Encounter: Payer: Self-pay | Admitting: Nurse Practitioner

## 2024-01-04 ENCOUNTER — Ambulatory Visit: Payer: Self-pay | Admitting: Nurse Practitioner

## 2024-01-04 VITALS — BP 116/70 | HR 69 | Temp 99.0°F | Ht 62.0 in | Wt 250.4 lb

## 2024-01-04 DIAGNOSIS — F17210 Nicotine dependence, cigarettes, uncomplicated: Secondary | ICD-10-CM

## 2024-01-04 DIAGNOSIS — E1159 Type 2 diabetes mellitus with other circulatory complications: Secondary | ICD-10-CM | POA: Diagnosis not present

## 2024-01-04 DIAGNOSIS — F325 Major depressive disorder, single episode, in full remission: Secondary | ICD-10-CM | POA: Diagnosis not present

## 2024-01-04 DIAGNOSIS — E1169 Type 2 diabetes mellitus with other specified complication: Secondary | ICD-10-CM

## 2024-01-04 DIAGNOSIS — G4733 Obstructive sleep apnea (adult) (pediatric): Secondary | ICD-10-CM

## 2024-01-04 DIAGNOSIS — E669 Obesity, unspecified: Secondary | ICD-10-CM

## 2024-01-04 DIAGNOSIS — I152 Hypertension secondary to endocrine disorders: Secondary | ICD-10-CM

## 2024-01-04 DIAGNOSIS — K5901 Slow transit constipation: Secondary | ICD-10-CM

## 2024-01-04 DIAGNOSIS — J432 Centrilobular emphysema: Secondary | ICD-10-CM

## 2024-01-04 DIAGNOSIS — E66813 Obesity, class 3: Secondary | ICD-10-CM

## 2024-01-04 DIAGNOSIS — Z6841 Body Mass Index (BMI) 40.0 and over, adult: Secondary | ICD-10-CM

## 2024-01-04 LAB — MICROALBUMIN, URINE WAIVED
Creatinine, Urine Waived: 10 mg/dL (ref 10–300)
Microalb, Ur Waived: 10 mg/L (ref 0–19)
Microalb/Creat Ratio: <30 mg/g (ref <30)

## 2024-01-04 LAB — BAYER DCA HB A1C WAIVED: HB A1C (BAYER DCA - WAIVED): 6.1 % — ABNORMAL HIGH (ref 4.8–5.6)

## 2024-01-04 MED ORDER — VITAMIN D3 1.25 MG (50000 UT) PO CAPS: 1.0000 | ORAL_CAPSULE | ORAL | 4 refills | Status: AC

## 2024-01-04 NOTE — Assessment & Plan Note (Signed)
 Chronic, stable with A1c 6.1% today and maintaining diet + weight & urine ALB 24 December 2023. At this time wishes to maintain off medication, this is appropriate and recommend continue focus on diet and regular exercise -- his daughter is a huge support with this.  Will restart medication as needed. - Vaccinations up to date - Eye exam and foot up to date - ACE and statin on board

## 2024-01-04 NOTE — Assessment & Plan Note (Signed)
 I have recommended complete cessation of tobacco use. I have discussed various options available for assistance with tobacco cessation including over the counter methods (Nicotine gum, patch and lozenges). We also discussed prescription options (Chantix, Nicotine Inhaler / Nasal Spray). The patient is not interested in pursuing any prescription tobacco cessation options at this time.

## 2024-01-04 NOTE — Assessment & Plan Note (Signed)
 Improving at this time with Senna S.  Having 2 BM a day.  Will continue these and collaboration with GI.

## 2024-01-04 NOTE — Assessment & Plan Note (Signed)
 Chronic, stable.  Noted on CT imaging, lung cancer screening.  FEV1 108% and FEV1/FVC 100% today, remaining stable compared to previous.  No current inhalers.  Recommend complete cessation of smoking and initiate inhalers as needed.  Continue annual lung screening.

## 2024-01-04 NOTE — Assessment & Plan Note (Signed)
 Chronic, stable. BP at goal in office today.  Recommend he monitor BP at least a few mornings a week at home and document.  DASH diet at home.  Continue current medication regimen and adjust as needed.  Labs today: CMP.  Refills as needed.

## 2024-01-04 NOTE — Assessment & Plan Note (Signed)
 BMI 45.80, some gain but he is going to work on diet again.  Recommended eating smaller high protein, low fat meals more frequently and exercising 30 mins a day 5 times a week with a goal of 10-15lb weight loss in the next 3 months. Patient voiced their understanding and motivation to adhere to these recommendations.

## 2024-01-04 NOTE — Progress Notes (Signed)
 BP 116/70   Pulse 69   Temp 99 F (37.2 C) (Oral)   Ht 5\' 2"  (1.575 m)   Wt 250 lb 6.4 oz (113.6 kg)   SpO2 100%   BMI 45.80 kg/m    Subjective:    Patient ID: Lance Kim, male    DOB: [birth year], 53 y.o.   MRN: [id number]  HPI: Lance Kim is a 52 y.o. male  Chief Complaint  Patient presents with   Depression   Diabetes   Hyperlipidemia   Hypertension   DIABETES October A1c 6.5%.  Remains off medication at this time and maintaining some of past weight loss.  Takes Gabapentin  only as needed + Meloxicam  -- takes both as needed. Hypoglycemic episodes:no Polydipsia/polyuria: no Visual disturbance: no Chest pain: no Paresthesias: no Glucose Monitoring: no             Accucheck frequency: not checking             Fasting glucose:              Post prandial:             Evening:              Before meals: Taking Insulin?: no             Long acting insulin:             Short acting insulin: Blood Pressure Monitoring: not checking Retinal Examination: Up To Date -- [facility] Foot Exam: Up to Date Pneumovax: Up to Date Influenza: Up to Date  Aspirin: yes    HYPERTENSION / HYPERLIPIDEMIA Taking Lisinopril -HCTZ, Amlodipine , and Atorvastatin , ASA.   Satisfied with current treatment? yes Duration of hypertension: chronic BP monitoring frequency: not checking BP range:  BP medication side effects: no Duration of hyperlipidemia: chronic Cholesterol medication side effects: no Cholesterol supplements: none Medication compliance: good compliance Aspirin: yes Recent stressors: no Recurrent headaches: no Visual changes: no Palpitations: no Dyspnea: no Chest pain: no Lower extremity edema: occasional Dizzy/lightheaded: no  The ASCVD Risk score (Arnett DK, et al., 2019) failed to calculate for the following reasons:   The valid total cholesterol range is 130 to 320 mg/dL  COPD Last lung screening 09/14/23.  Emphysema and bilateral gynecomastia.    Continues to smoke daily -- smoking 1/2 PPD, started smoking at age 42. COPD status: stable Satisfied with current treatment?: yes Oxygen use: no Dyspnea frequency: none Cough frequency: none Rescue inhaler frequency:  none Limitation of activity: no Productive cough: none Last Spirometry: today Pneumovax: Up to Date Influenza: Up To Date   DEPRESSION Takes Prozac  20 MG daily.  Mood status: stable Satisfied with current treatment?: yes Symptom severity: mild  Duration of current treatment : chronic Side effects: no Medication compliance: good compliance Psychotherapy/counseling: none Depressed mood: no Anxious mood: no Anhedonia: no Significant weight loss or gain: no Insomnia: sometimes Fatigue: no Feelings of worthlessness or guilt: no Impaired concentration/indecisiveness: no Suicidal ideations: no Hopelessness: no Crying spells: no    01/04/2024    4:08 PM 11/02/2023    3:28 PM 10/13/2023    4:05 PM 09/07/2023   10:07 AM 07/03/2023    3:48 PM  Depression screen PHQ 2/9  Decreased Interest 1 1 1 1 1   Down, Depressed, Hopeless 1 1 1 1 1   PHQ - 2 Score 2 2 2 2 2   Altered sleeping 1 1 1 1 1   Tired, decreased energy 1 1 1 1  1  Change in appetite 1 1 1 2 1   Feeling bad or failure about yourself  1 0 1 1 1   Trouble concentrating 1 1 1 1 1   Moving slowly or fidgety/restless 1 1 1  0 0  Suicidal thoughts 0 0 1 0 0  PHQ-9 Score 8 7 9 8 7   Difficult doing work/chores Not difficult at all Somewhat difficult Somewhat difficult Not difficult at all Somewhat difficult       01/04/2024    4:08 PM 11/02/2023    3:29 PM 10/13/2023    4:06 PM 09/07/2023   10:07 AM  GAD 7 : Generalized Anxiety Score  Nervous, Anxious, on Edge 1 1 1 1   Control/stop worrying 1 1 1 1   Worry too much - different things 1 1 1 1   Trouble relaxing 1 1 1 1   Restless 1 0 1 1  Easily annoyed or irritable 1 1 1  0  Afraid - awful might happen 1 1 1  0  Total GAD 7 Score 7 6 7 5   Anxiety  Difficulty Not difficult at all Somewhat difficult Somewhat difficult Not difficult at all    Relevant past medical, surgical, family and social history reviewed and updated as indicated. Interim medical history since our last visit reviewed. Allergies and medications reviewed and updated.  Review of Systems  Constitutional:  Negative for activity change, diaphoresis, fatigue and fever.  Respiratory:  Negative for cough, chest tightness, shortness of breath and wheezing.   Cardiovascular:  Negative for chest pain, palpitations and leg swelling.  Gastrointestinal:  Positive for constipation (occasional). Negative for abdominal distention, abdominal pain, diarrhea, nausea and vomiting.  Endocrine: Negative for polydipsia, polyphagia and polyuria.  Neurological: Negative.   Psychiatric/Behavioral: Negative.      Per HPI unless specifically indicated above     Objective:    BP 116/70   Pulse 69   Temp 99 F (37.2 C) (Oral)   Ht 5\' 2"  (1.575 m)   Wt 250 lb 6.4 oz (113.6 kg)   SpO2 100%   BMI 45.80 kg/m   Wt Readings from Last 3 Encounters:  01/04/24 250 lb 6.4 oz (113.6 kg)  12/10/23 242 lb (109.8 kg)  11/02/23 253 lb 12.8 oz (115.1 kg)    Physical Exam Vitals and nursing note reviewed.  Constitutional:      General: He is awake. He is not in acute distress.    Appearance: He is well-developed and well-groomed. He is obese. He is not ill-appearing or toxic-appearing.  HENT:     Head: Normocephalic.     Right Ear: Hearing and external ear normal. No drainage.     Left Ear: Hearing and external ear normal. No drainage.     Mouth/Throat:     Pharynx: Uvula midline.  Eyes:     General: Lids are normal.        Right eye: No discharge.        Left eye: No discharge.     Conjunctiva/sclera: Conjunctivae normal.     Pupils: Pupils are equal, round, and reactive to light.  Neck:     Vascular: No carotid bruit.  Cardiovascular:     Rate and Rhythm: Normal rate and regular  rhythm.     Heart sounds: Normal heart sounds, S1 normal and S2 normal. No murmur heard.    No gallop.     Comments: No edema to legs. Pulmonary:     Effort: Pulmonary effort is normal. No accessory muscle usage or respiratory distress.  Breath sounds: Normal breath sounds.  Abdominal:     General: Bowel sounds are normal.     Palpations: Abdomen is soft.  Musculoskeletal:        General: Normal range of motion.     Cervical back: Normal range of motion and neck supple.     Right lower leg: No edema.     Left lower leg: No edema.  Skin:    General: Skin is warm and dry.     Capillary Refill: Capillary refill takes less than 2 seconds.  Neurological:     Mental Status: He is alert and oriented to person, place, and time.     Deep Tendon Reflexes: Reflexes are normal and symmetric.  Psychiatric:        Attention and Perception: Attention normal.        Mood and Affect: Mood normal.        Speech: Speech normal.        Behavior: Behavior normal. Behavior is cooperative.        Thought Content: Thought content normal.     Results for orders placed or performed during the hospital encounter of 12/10/23  Surgical pathology   Collection Time: 12/10/23 12:00 AM  Result Value Ref Range   SURGICAL PATHOLOGY      SURGICAL PATHOLOGY [facility] [address], [address] [place], [place] [address] Telephone [phone] or [phone] Fax [phone]  REPORT OF SURGICAL PATHOLOGY   Accession #: [id number] Patient Name: BRYAN, GOIN Visit # : [id number]  MRN: [id number] Physician: Luke Salaam DOB/Age [birth year] (Age: 60) Gender: M Collected Date: 12/10/2023 Received Date: 12/10/2023  FINAL DIAGNOSIS       1. Ascending  Colon Polyp, cold snare :       - TUBULAR ADENOMA.      - NEGATIVE FOR HIGH-GRADE DYSPLASIA AND MALIGNANCY.       DATE SIGNED OUT: 12/11/2023 ELECTRONIC SIGNATURE : Brunetta Capes Md, Alexandria Ida , Pathologist, Electronic  Signature  MICROSCOPIC DESCRIPTION  CASE COMMENTS STAINS USED IN DIAGNOSIS: H&E    CLINICAL HISTORY  SPECIMEN(S) OBTAINED 1. Ascending  Colon Polyp, Cold Snare  SPECIMEN COMMENTS: SPECIMEN CLINICAL INFORMATION: 1. Change in bowel habits. Polyps    Gross Description 1. Received in is a 0.6 x 0.6 x 0.2 cm  aggregate of three tan polypoid tissue fragments submitted entirely in block 1A.   (SB:kh 12/10/23)        Report signed out from the following location(s) [place] [address] [place], [place] [address] CLIA #: [id number]  [facility] [address] [place], [place] [address] CLIA #: [id number]   Glucose, capillary   Collection Time: 12/10/23 10:14 AM  Result Value Ref Range   Glucose-Capillary 112 (H) 70 - 99 mg/dL      Assessment & Plan:   Problem List Items Addressed This Visit       Cardiovascular and Mediastinum   Hypertension associated with type 2 diabetes mellitus (HCC)   Chronic, stable. BP at goal in office today.  Recommend he monitor BP at least a few mornings a week at home and document.  DASH diet at home.  Continue current medication regimen and adjust as needed.  Labs today: CMP.  Refills as needed.       Relevant Orders   Bayer DCA Hb A1c Waived   Microalbumin, Urine Waived   Comprehensive metabolic panel with GFR     Respiratory   Obstructive  sleep apnea syndrome   Centrilobular emphysema (HCC)   Chronic, stable.  Noted on CT imaging, lung cancer screening.  FEV1 108% and FEV1/FVC 100% today, remaining stable compared to previous.  No current inhalers.  Recommend complete cessation of smoking and initiate inhalers as needed.  Continue annual lung screening.      Relevant Orders   Spirometry with Graph (Completed)     Digestive   Slow transit constipation   Improving at this time with Senna S.  Having 2 BM a day.  Will continue these and collaboration with GI.         Endocrine   Type  2 diabetes mellitus with obesity (HCC) - Primary   Chronic, stable with A1c 6.1% today and maintaining diet + weight & urine ALB 24 December 2023. At this time wishes to maintain off medication, this is appropriate and recommend continue focus on diet and regular exercise -- his daughter is a huge support with this.  Will restart medication as needed. - Vaccinations up to date - Eye exam and foot up to date - ACE and statin on board      Relevant Orders   Bayer DCA Hb A1c Waived   Hyperlipidemia associated with type 2 diabetes mellitus (HCC)   Chronic, ongoing.  Continue current medication regimen and adjust as needed.  Lipid panel today.      Relevant Orders   Bayer DCA Hb A1c Waived   Lipid Panel w/o Chol/HDL Ratio     Other   Obesity   BMI 45.80, some gain but he is going to work on diet again.  Recommended eating smaller high protein, low fat meals more frequently and exercising 30 mins a day 5 times a week with a goal of 10-15lb weight loss in the next 3 months. Patient voiced their understanding and motivation to adhere to these recommendations.       Depression   Chronic, stable.  Denies SI/HI.  Continue current medication regimen and adjust as needed.        Cigarette nicotine dependence without complication   I have recommended complete cessation of tobacco use. I have discussed various options available for assistance with tobacco cessation including over the counter methods (Nicotine gum, patch and lozenges). We also discussed prescription options (Chantix, Nicotine Inhaler / Nasal Spray). The patient is not interested in pursuing any prescription tobacco cessation options at this time.         Follow up plan: Return in about 6 months (around 07/05/2024) for Annual Physical after 07/02/24.

## 2024-01-04 NOTE — Assessment & Plan Note (Signed)
 Chronic, ongoing.  Continue current medication regimen and adjust as needed. Lipid panel today.

## 2024-01-04 NOTE — Assessment & Plan Note (Signed)
 Chronic, stable.  Denies SI/HI.  Continue current medication regimen and adjust as needed.

## 2024-01-05 ENCOUNTER — Encounter: Payer: Self-pay | Admitting: Nurse Practitioner

## 2024-01-05 LAB — LIPID PANEL W/O CHOL/HDL RATIO
Cholesterol, Total: 117 mg/dL (ref 100–199)
HDL: 31 mg/dL — ABNORMAL LOW (ref >39)
LDL Chol Calc (NIH): 64 mg/dL (ref 0–99)
Triglycerides: 119 mg/dL (ref 0–149)
VLDL Cholesterol Cal: 22 mg/dL (ref 5–40)

## 2024-01-05 LAB — COMPREHENSIVE METABOLIC PANEL WITH GFR
ALT: 31 IU/L (ref 0–44)
AST: 26 IU/L (ref 0–40)
Albumin: 4.1 g/dL (ref 3.8–4.9)
Alkaline Phosphatase: 80 IU/L (ref 44–121)
BUN/Creatinine Ratio: 8 — ABNORMAL LOW (ref 9–20)
BUN: 8 mg/dL (ref 6–24)
Bilirubin Total: 0.2 mg/dL (ref 0.0–1.2)
CO2: 22 mmol/L (ref 20–29)
Calcium: 9.4 mg/dL (ref 8.7–10.2)
Chloride: 100 mmol/L (ref 96–106)
Creatinine, Ser: 1.03 mg/dL (ref 0.76–1.27)
Globulin, Total: 2.9 g/dL (ref 1.5–4.5)
Glucose: 114 mg/dL — ABNORMAL HIGH (ref 70–99)
Potassium: 4.1 mmol/L (ref 3.5–5.2)
Sodium: 137 mmol/L (ref 134–144)
Total Protein: 7 g/dL (ref 6.0–8.5)
eGFR: 86 mL/min/{1.73_m2} (ref >59)

## 2024-01-05 NOTE — Progress Notes (Signed)
 Contacted via MyChart   Good afternoon Lathen, your labs have returned and continue to look fantastic.  Any questions? Keep being stellar!!  Thank you for allowing me to participate in your care.  I appreciate you. Kindest regards, Daxtyn Rottenberg

## 2024-02-04 ENCOUNTER — Encounter: Payer: Self-pay | Admitting: Gastroenterology

## 2024-02-05 ENCOUNTER — Telehealth: Payer: Self-pay

## 2024-02-05 NOTE — Telephone Encounter (Signed)
 Please call and schedule the patient an appointment for evaluation per Jolene.

## 2024-02-05 NOTE — Telephone Encounter (Signed)
 Copied from CRM [id number]. Topic: Clinical - Medication Question >> Feb 05, 2024  8:24 AM Lotus Round B wrote: Reason for CRM: pt called in to see if can get a prescription for some joint pain sent to the pharmacy on file . He would like a call if he has to go in and see the doctor first .

## 2024-02-11 NOTE — Telephone Encounter (Signed)
 Appointment has been made

## 2024-02-18 ENCOUNTER — Other Ambulatory Visit: Payer: Self-pay | Admitting: Nurse Practitioner

## 2024-02-19 NOTE — Telephone Encounter (Signed)
 Requested Prescriptions  Pending Prescriptions Disp Refills   cetirizine  (ZYRTEC ) 10 MG tablet [Pharmacy Med Name: CETIRIZINE  10MG  TABLETS] 90 tablet 0    Sig: TAKE 1 TABLET(10 MG) BY MOUTH DAILY     Ear, Nose, and Throat:  Antihistamines 2 Passed - 02/19/2024 10:25 AM      Passed - Cr in normal range and within 360 days    Creatinine, Ser  Date Value Ref Range Status  01/04/2024 1.03 0.76 - 1.27 mg/dL Final         Passed - Valid encounter within last 12 months    Recent Outpatient Visits           1 month ago Type 2 diabetes mellitus with obesity (HCC)   [place] [facility] [place], Meriden T, NP   3 months ago Slow transit constipation   [facility] [facility] [place], Lavelle Posey, NP

## 2024-02-21 NOTE — Patient Instructions (Signed)
 Joint Pain  Joint pain can be caused by many things. It may go away if you follow instructions from your health care provider for taking care of yourself at home. Sometimes, you may need more treatment. Joint pain can be caused by: Bruises at the area of the joint. An injury caused by movements that are repeated. Wear and tear on the joint as you get older. Buildup of uric acid crystals in the joint. This is also called gout. Irritation and swelling of the joint. Types of arthritis. Infections of the joint or of the bone. Your provider may tell you to take pain medicine or wear an elastic bandage, sling, or splint. If your joint pain continues, you may need lab or imaging tests to find the cause of your joint pain. Follow these instructions at home: If you have an elastic bandage, sling, or splint that can be taken off: Wear the bandage, sling, or splint as told by your provider. Take it off only if your provider says you can. Check the skin under and around it every day. Tell your provider if you see problems. Loosen it if your fingers or toes tingle, are numb, or turn cold and blue. Keep it clean and dry. Ask your provider if you should remove it before bathing. If the bandage, sling, or splint is not waterproof: Do not let it get wet. Cover it when you take a bath or shower. Use a cover that does not let any water in. Managing pain, stiffness, and swelling     If told, put ice on the area. If you have an elastic bandage, sling, or splint that you can take off, remove it as told. Put ice in a plastic bag. Place a towel between your skin and the bag. Leave the ice on for 20 minutes, 2-3 times a day. If told, put heat on the area. Do this as often as told. Use the heat source that your provider recommends, such as a moist heat pack or a heating pad. Place a towel between your skin and the heat source. Leave the heat on for 20-30 minutes. If your skin turns bright red, take off the  ice or heat right away to prevent skin damage. The risk of damage is higher if you can't feel pain, heat, or cold. Move your fingers or toes often to reduce stiffness and swelling. Raise the injured area above the level of your heart while you're sitting or lying down. Use a pillow to support the painful area as needed. Activity Rest the painful joint as told. Do not do things that cause pain or make pain worse. Begin exercising or stretching the affected area as told by your provider. Return to normal activities when you are told. Ask what things are safe for you to do. General instructions Take your medicines as told by your provider. Treatment may include medicines for pain and swelling that are taken by mouth or applied to the skin. Do not smoke, vape, or use products with nicotine or tobacco in them. If you need help quitting, talk with your provider. Keep all follow-up visits. Your provider will want to check on your condition. Contact a health care provider if: You have pain that does not get better with medicine. Your joint pain does not improve within 3 days. You have more bruising or swelling. You have a fever. You lose 10 lb (4.5 kg) or more without trying. Get help right away if: You cannot move the joint. Your fingers  or toes tingle, become numb, or turn cold and blue. You have a fever along with a joint that's red, warm, and swollen. This information is not intended to replace advice given to you by your health care provider. Make sure you discuss any questions you have with your health care provider. Document Revised: 06/04/2023 Document Reviewed: 11/14/2022 Elsevier Patient Education  2024 [employer].

## 2024-02-25 ENCOUNTER — Ambulatory Visit: Admitting: Nurse Practitioner

## 2024-02-25 ENCOUNTER — Ambulatory Visit
Admission: RE | Admit: 2024-02-25 | Discharge: 2024-02-25 | Disposition: A | Discharge status: Home / Self Care | Source: Ambulatory Visit | Attending: Nurse Practitioner | Admitting: Nurse Practitioner

## 2024-02-25 ENCOUNTER — Encounter: Payer: Self-pay | Admitting: Nurse Practitioner

## 2024-02-25 ENCOUNTER — Ambulatory Visit: Payer: Self-pay | Admitting: Nurse Practitioner

## 2024-02-25 VITALS — BP 117/71 | HR 73 | Temp 98.6°F | Ht 62.0 in | Wt 251.0 lb

## 2024-02-25 DIAGNOSIS — L409 Psoriasis, unspecified: Secondary | ICD-10-CM | POA: Diagnosis not present

## 2024-02-25 DIAGNOSIS — M2550 Pain in unspecified joint: Secondary | ICD-10-CM | POA: Diagnosis present

## 2024-02-25 DIAGNOSIS — R768 Other specified abnormal immunological findings in serum: Secondary | ICD-10-CM

## 2024-02-25 DIAGNOSIS — R700 Elevated erythrocyte sedimentation rate: Secondary | ICD-10-CM

## 2024-02-25 MED ORDER — PREDNISONE 10 MG PO TABS: ORAL_TABLET | ORAL | 0 refills | Status: DC

## 2024-02-25 MED ORDER — FLUOXETINE HCL 20 MG PO CAPS: ORAL_CAPSULE | ORAL | 4 refills | Status: AC

## 2024-02-25 MED ORDER — ATORVASTATIN CALCIUM 20 MG PO TABS: ORAL_TABLET | ORAL | 4 refills | Status: AC

## 2024-02-25 MED ORDER — AMLODIPINE BESYLATE 10 MG PO TABS: ORAL_TABLET | ORAL | 4 refills | Status: AC

## 2024-02-25 MED ORDER — LISINOPRIL-HYDROCHLOROTHIAZIDE 20-12.5 MG PO TABS: 1.0000 | ORAL_TABLET | Freq: Every day | ORAL | 4 refills | Status: AC

## 2024-02-25 NOTE — Progress Notes (Signed)
 Contacted via MyChart  Good evening Lance Kim, so far ankle imaging has returned and there are some arthritis changes noted + a heel spur.  We will see what shoulder shows too, but you may benefit from some physical therapy time in future dependent on labs and imaging results.  Any questions? Keep being stellar!!  Thank you for allowing me to participate in your care.  I appreciate you. Kindest regards, Nolene Rocks

## 2024-02-25 NOTE — Assessment & Plan Note (Signed)
 Chronic and worsening over past year.  Has been present for years to multiple joints. Will obtain labs and consider referral to rheumatology after all labs have returned.  Discussed with patient. ?psoriatic arthritis. Prednisone  taper sent in today for acute discomfort.  Will obtain imaging of left shoulder and left ankle, the areas with most pain.

## 2024-02-25 NOTE — Assessment & Plan Note (Signed)
 Followed by dermatology, continue this collaboration and medication as ordered by them.  Having ongoing all over joint pain to multiple areas, worsening over the past year.  Will obtain labs and consider referral to rheumatology after all labs have returned.  Discussed with patient. ?psoriatic arthritis.

## 2024-02-25 NOTE — Progress Notes (Signed)
 BP 117/71   Pulse 73   Temp 98.6 F (37 C) (Oral)   Ht 5' 2 (1.575 m)   Wt 251 lb (113.9 kg)   SpO2 97%   BMI 45.91 kg/m    Subjective:    Patient ID: Lance Kim, male    DOB: Jan 25, 1971, 53 y.o.   MRN: [id number]  HPI: Lance Kim is a 53 y.o. male  Chief Complaint  Patient presents with   Joint Pains    Patient states he has been having all over joint pains for years. States that over the last month and a half, the pains have gotten worse and have become more constant. Describes the pains as achy feeling. States the place he hurts the most is in his L shoulder. States that sometimes the pain runs down into his arm and makes his hand go numb.    ARTHRALGIAS / JOINT ACHES For may years has had joint , then over month and a half this has progressed and gotten worse.  Has underlying psoriasis, follows with dermatology. Left ankle and left shoulder are causing most pain at present. Last shoulder imaging left was in 2022 with mild degenerative changes. Duration: chronic Pain: yes Symmetric: yes  5/10 daily basis Quality: dull, aching, and throbbing Frequency: intermittent Context:  worse Decreased function/range of motion: at times, recently with left shoulder Erythema: yes Swelling: yes Heat or warmth: no Morning stiffness: yes with lasting > 30 minutes Aggravating factors: laying on wrong side, movement, heat or cold Alleviating factors: nothing Relief with NSAIDs?: no, Meloxicam  offering no help now Treatments attempted:  rest, heat, APAP, ibuprofen , and aleve , Meloxicam , Lidocaine  cream Involved Joints:     Hands: yes bilateral sometimes and will cramp up    Wrists: yes bilateral     Elbows: yes bilateral    Shoulders: yes bilateral    Back: yes     Hips: yes bilateral    Knees: yes bilateral    Ankles: yes bilateral    Feet: yes bilateral   Relevant past medical, surgical, family and social history reviewed and updated as indicated. Interim medical  history since our last visit reviewed. Allergies and medications reviewed and updated.  Review of Systems  Constitutional:  Negative for activity change, diaphoresis, fatigue and fever.  Respiratory:  Negative for cough, chest tightness, shortness of breath and wheezing.   Cardiovascular:  Negative for chest pain, palpitations and leg swelling.  Gastrointestinal: Negative.   Endocrine: Negative.   Musculoskeletal:  Positive for arthralgias.  Neurological: Negative.   Psychiatric/Behavioral: Negative.      Per HPI unless specifically indicated above     Objective:    BP 117/71   Pulse 73   Temp 98.6 F (37 C) (Oral)   Ht 5' 2 (1.575 m)   Wt 251 lb (113.9 kg)   SpO2 97%   BMI 45.91 kg/m   Wt Readings from Last 3 Encounters:  02/25/24 251 lb (113.9 kg)  01/04/24 250 lb 6.4 oz (113.6 kg)  12/10/23 242 lb (109.8 kg)    Physical Exam Vitals and nursing note reviewed.  Constitutional:      General: He is awake. He is not in acute distress.    Appearance: He is well-developed and well-groomed. He is obese. He is not ill-appearing or toxic-appearing.  HENT:     Head: Normocephalic.     Right Ear: Hearing and external ear normal. No drainage.     Left Ear: Hearing and external  ear normal. No drainage.     Mouth/Throat:     Pharynx: Uvula midline.   Eyes:     General: Lids are normal.        Right eye: No discharge.        Left eye: No discharge.     Conjunctiva/sclera: Conjunctivae normal.     Pupils: Pupils are equal, round, and reactive to light.   Neck:     Vascular: No carotid bruit.   Cardiovascular:     Rate and Rhythm: Normal rate and regular rhythm.     Heart sounds: Normal heart sounds, S1 normal and S2 normal. No murmur heard.    No gallop.     Comments: No edema to legs. Pulmonary:     Effort: Pulmonary effort is normal. No accessory muscle usage or respiratory distress.     Breath sounds: Normal breath sounds.  Abdominal:     General: Bowel sounds  are normal.     Palpations: Abdomen is soft.   Musculoskeletal:     Right shoulder: Normal.     Left shoulder: No swelling, laceration, tenderness or crepitus. Decreased range of motion. Normal strength.     Right wrist: Normal.     Left wrist: Normal.     Right hand: No swelling or tenderness. Normal range of motion. Normal strength. Normal sensation. Normal pulse.     Left hand: No swelling or tenderness. Normal range of motion. Normal strength. Normal sensation. Normal pulse.     Cervical back: Normal range of motion and neck supple.     Right lower leg: No edema.     Left lower leg: No edema.   Skin:    General: Skin is warm and dry.     Capillary Refill: Capillary refill takes less than 2 seconds.     Findings: Rash present. Rash is scaling.     Comments: Psoriasis patches to right shin and bilateral elbows.   Neurological:     Mental Status: He is alert and oriented to person, place, and time.     Deep Tendon Reflexes: Reflexes are normal and symmetric.   Psychiatric:        Attention and Perception: Attention normal.        Mood and Affect: Mood normal.        Speech: Speech normal.        Behavior: Behavior normal. Behavior is cooperative.        Thought Content: Thought content normal.    Results for orders placed or performed in visit on 01/04/24  Bayer DCA Hb A1c Waived   Collection Time: 01/04/24  4:12 PM  Result Value Ref Range   HB A1C (BAYER DCA - WAIVED) 6.1 (H) 4.8 - 5.6 %  Microalbumin, Urine Waived   Collection Time: 01/04/24  4:12 PM  Result Value Ref Range   Microalb, Ur Waived 10 0 - 19 mg/L   Creatinine, Urine Waived 10 10 - 300 mg/dL   Microalb/Creat Ratio <30 <30 mg/g  Comprehensive metabolic panel with GFR   Collection Time: 01/04/24  4:12 PM  Result Value Ref Range   Glucose 114 (H) 70 - 99 mg/dL   BUN 8 6 - 24 mg/dL   Creatinine, Ser 9.14 0.76 - 1.27 mg/dL   eGFR 86 >78 GN/FAO/1.30   BUN/Creatinine Ratio 8 (L) 9 - 20   Sodium 137 134 -  144 mmol/L   Potassium 4.1 3.5 - 5.2 mmol/L   Chloride 100 96 - 106  mmol/L   CO2 22 20 - 29 mmol/L   Calcium  9.4 8.7 - 10.2 mg/dL   Total Protein 7.0 6.0 - 8.5 g/dL   Albumin 4.1 3.8 - 4.9 g/dL   Globulin, Total 2.9 1.5 - 4.5 g/dL   Bilirubin Total 0.2 0.0 - 1.2 mg/dL   Alkaline Phosphatase 80 44 - 121 IU/L   AST 26 0 - 40 IU/L   ALT 31 0 - 44 IU/L  Lipid Panel w/o Chol/HDL Ratio   Collection Time: 01/04/24  4:12 PM  Result Value Ref Range   Cholesterol, Total 117 100 - 199 mg/dL   Triglycerides 454 0 - 149 mg/dL   HDL 31 (L) >09 mg/dL   VLDL Cholesterol Cal 22 5 - 40 mg/dL   LDL Chol Calc (NIH) 64 0 - 99 mg/dL      Assessment & Plan:   Problem List Items Addressed This Visit       Musculoskeletal and Integument   Psoriasis   Followed by dermatology, continue this collaboration and medication as ordered by them.  Having ongoing all over joint pain to multiple areas, worsening over the past year.  Will obtain labs and consider referral to rheumatology after all labs have returned.  Discussed with patient. ?psoriatic arthritis.      Relevant Orders   ANA 12 Plus Profile (RDL)   C-reactive protein   Sedimentation rate   Rheumatoid factor   CBC with Differential/Platelet     Other   Multiple joint pain - Primary   Chronic and worsening over past year.  Has been present for years to multiple joints. Will obtain labs and consider referral to rheumatology after all labs have returned.  Discussed with patient. ?psoriatic arthritis. Prednisone  taper sent in today for acute discomfort.  Will obtain imaging of left shoulder and left ankle, the areas with most pain.      Relevant Orders   ANA 12 Plus Profile (RDL)   C-reactive protein   Sedimentation rate   Rheumatoid factor   CBC with Differential/Platelet   DG Shoulder Left   DG Ankle Complete Left     Follow up plan: Return if symptoms worsen or fail to improve, for dependent on labs may see sooner.

## 2024-02-26 NOTE — Progress Notes (Signed)
 Contacted via MyChart  Good afternoon Lance Kim, your labs are beginning to return: - Sed rate is mildly elevated, but CRP normal, these are inflammatory labs we monitor.  ANA is still pending. Rheumatoid factor is normal. - Blood counts look good.  Will let you know when ANA returns.  Any questions? Keep being amazing!!  Thank you for allowing me to participate in your care.  I appreciate you. Kindest regards, Moncia Annas

## 2024-03-04 NOTE — Progress Notes (Signed)
 Contacted via MyChart  Good morning Lance Kim, your ANA returned positive, waiting on the remainder to return and then may benefit visit with rheumatology.

## 2024-03-07 NOTE — Progress Notes (Signed)
 Contacted via MyChart  Good afternoon Lance Kim, your ANA did return positive, but speckled pattern is on lower side which can sometimes mean not a true positive.  However, that being said your C3 complement is a little elevated as is your ESR + you have psoriasis.  I would recommend we try to get you into rheumatology for further assessment since you do have significant areas of joint pain.  Would you like this referral?

## 2024-03-09 LAB — CBC WITH DIFFERENTIAL/PLATELET
Basophils Absolute: 0.1 10*3/uL (ref 0.0–0.2)
Basos: 1 %
EOS (ABSOLUTE): 0.3 10*3/uL (ref 0.0–0.4)
Eos: 3 %
Hematocrit: 45.6 % (ref 37.5–51.0)
Hemoglobin: 14.9 g/dL (ref 13.0–17.7)
Immature Grans (Abs): 0 10*3/uL (ref 0.0–0.1)
Immature Granulocytes: 0 %
Lymphocytes Absolute: 2.2 10*3/uL (ref 0.7–3.1)
Lymphs: 30 %
MCH: 28.5 pg (ref 26.6–33.0)
MCHC: 32.7 g/dL (ref 31.5–35.7)
MCV: 87 fL (ref 79–97)
Monocytes Absolute: 0.9 10*3/uL (ref 0.1–0.9)
Monocytes: 12 %
Neutrophils Absolute: 4.1 10*3/uL (ref 1.4–7.0)
Neutrophils: 54 %
Platelets: 244 10*3/uL (ref 150–450)
RBC: 5.22 x10E6/uL (ref 4.14–5.80)
RDW: 14.2 % (ref 11.6–15.4)
WBC: 7.5 10*3/uL (ref 3.4–10.8)

## 2024-03-09 LAB — SEDIMENTATION RATE: Sed Rate: 33 mm/h — ABNORMAL HIGH (ref 0–30)

## 2024-03-09 LAB — ANA 12 PLUS PROFILE, POSITIVE
Anti-Centromere Ab (RDL): <1:40 {titer}
Anti-TPO Ab (RDL): <9 [IU]/mL (ref <9.0)
C3 Complement (RDL): 184 mg/dL — ABNORMAL HIGH (ref 90–180)
C4 Complement (RDL): 34 mg/dL (ref 10–40)
Speckled Pattern: 1:40 {titer} — ABNORMAL HIGH

## 2024-03-09 LAB — ANA 12 PLUS PROFILE (RDL): Anti-Nuclear Ab by IFA (RDL): POSITIVE — AB

## 2024-03-09 LAB — C-REACTIVE PROTEIN: CRP: 6 mg/L (ref 0–10)

## 2024-03-09 LAB — RHEUMATOID FACTOR: Rheumatoid fact SerPl-aCnc: <10 [IU]/mL (ref <14.0)

## 2024-03-27 ENCOUNTER — Other Ambulatory Visit: Payer: Self-pay | Admitting: Nurse Practitioner

## 2024-03-29 NOTE — Telephone Encounter (Signed)
 Too soon for refill.  Requested Prescriptions  Pending Prescriptions Disp Refills   lisinopril -hydrochlorothiazide  (ZESTORETIC ) 20-12.5 MG tablet [Pharmacy Med Name: LISINOPRIL -HCTZ 20/12.5MG  TABLETS] 90 tablet 4    Sig: TAKE 1 TABLET BY MOUTH DAILY     Cardiovascular:  ACEI + Diuretic Combos Passed - 03/29/2024  9:59 AM      Passed - Na in normal range and within 180 days    Sodium  Date Value Ref Range Status  01/04/2024 137 134 - 144 mmol/L Final         Passed - K in normal range and within 180 days    Potassium  Date Value Ref Range Status  01/04/2024 4.1 3.5 - 5.2 mmol/L Final         Passed - Cr in normal range and within 180 days    Creatinine, Ser  Date Value Ref Range Status  01/04/2024 1.03 0.76 - 1.27 mg/dL Final         Passed - eGFR is 30 or above and within 180 days    GFR calc Af Amer  Date Value Ref Range Status  05/25/2020 90 >59 mL/min/1.73 Final    Comment:    **[facility] currently reports eGFR in compliance with the current**   recommendations of the [employer]. [facility] will   update reporting as new guidelines are published from the NKF-ASN   Task force.    GFR calc non Af Amer  Date Value Ref Range Status  05/25/2020 78 >59 mL/min/1.73 Final   eGFR  Date Value Ref Range Status  01/04/2024 86 >59 mL/min/1.73 Final         Passed - Patient is not pregnant      Passed - Last BP in normal range    BP Readings from Last 1 Encounters:  02/25/24 117/71         Passed - Valid encounter within last 6 months    Recent Outpatient Visits           1 month ago Multiple joint pain   [place] [facility] [place], [place] T, NP   2 months ago Type 2 diabetes mellitus with obesity (HCC)   [facility] [place], [place] T, NP   4 months ago Slow transit constipation   [facility] [facility] [place], Jolene T, NP               amLODipine  (NORVASC ) 10 MG tablet [Pharmacy Med Name:  AMLODIPINE  BESYLATE 10MG TABLETS] 90 tablet 4    Sig: TAKE 1 TABLET(10 MG) BY MOUTH DAILY     Cardiovascular: Calcium  Channel Blockers 2 Passed - 03/29/2024  9:59 AM      Passed - Last BP in normal range    BP Readings from Last 1 Encounters:  02/25/24 117/71         Passed - Last Heart Rate in normal range    Pulse Readings from Last 1 Encounters:  02/25/24 73         Passed - Valid encounter within last 6 months    Recent Outpatient Visits           1 month ago Multiple joint pain   [place] [facility] [place], [place] T, NP   2 months ago Type 2 diabetes mellitus with obesity (HCC)   [place] [facility] [place], [place] T, NP   4 months ago Slow transit constipation   [place] [facility] [place], Jolene  T, NP               FLUoxetine  (PROZAC ) 20 MG capsule [Pharmacy Med Name: FLUOXETINE  20MG  CAPSULES] 90 capsule 4    Sig: TAKE 1 CAPSULE(20 MG) BY MOUTH DAILY     Psychiatry:  Antidepressants - SSRI Passed - 03/29/2024  9:59 AM      Passed - Completed PHQ-2 or PHQ-9 in the last 360 days      Passed - Valid encounter within last 6 months    Recent Outpatient Visits           1 month ago Multiple joint pain   [place] [facility] [place], [place] T, NP   2 months ago Type 2 diabetes mellitus with obesity (HCC)   [place] [facility] [place], [place] T, NP   4 months ago Slow transit constipation   [place] [facility] [place], Melanie DASEN, NP

## 2024-03-30 ENCOUNTER — Encounter: Payer: Self-pay | Admitting: Nurse Practitioner

## 2024-05-20 ENCOUNTER — Other Ambulatory Visit: Payer: Self-pay | Admitting: Nurse Practitioner

## 2024-05-20 NOTE — Telephone Encounter (Signed)
 Requested Prescriptions  Pending Prescriptions Disp Refills   cetirizine  (ZYRTEC ) 10 MG tablet [Pharmacy Med Name: CETIRIZINE  10MG  TABLETS] 90 tablet 0    Sig: TAKE 1 TABLET(10 MG) BY MOUTH DAILY     Ear, Nose, and Throat:  Antihistamines 2 Passed - 05/20/2024 11:58 AM      Passed - Cr in normal range and within 360 days    Creatinine, Ser  Date Value Ref Range Status  01/04/2024 1.03 0.76 - 1.27 mg/dL Final         Passed - Valid encounter within last 12 months    Recent Outpatient Visits           2 months ago Multiple joint pain   [place] [facility], [place] T, NP   4 months ago Type 2 diabetes mellitus with obesity (HCC)   [place] [facility] [place], [place] T, NP   6 months ago Slow transit constipation   [place] [facility] [place], Melanie DASEN, NP

## 2024-05-30 ENCOUNTER — Other Ambulatory Visit: Payer: Self-pay | Admitting: Nurse Practitioner

## 2024-05-31 ENCOUNTER — Other Ambulatory Visit: Payer: Self-pay | Admitting: Nurse Practitioner

## 2024-05-31 NOTE — Telephone Encounter (Unsigned)
 Copied from CRM [id number]. Topic: Clinical - Medication Refill >> May 31, 2024 11:12 AM Lance Kim wrote: Medication:  fluticasone  (FLONASE ) 50 MCG/ACT nasal spray   Has the patient contacted their pharmacy? Yes (Agent: If no, request that the patient contact the pharmacy for the refill. If patient does not wish to contact the pharmacy document the reason why and proceed with request.) (Agent: If yes, when and what did the pharmacy advise?)  This is the patient's preferred pharmacy:  [id number] [place], [place] - [address] AT [facility] OF [address] [place] [address] Phone: [phone] Fax: [phone]  Is this the correct pharmacy for this prescription? Yes If no, delete pharmacy and type the correct one.   Has the prescription been filled recently? No  Is the patient out of the medication? Yes  Has the patient been seen for an appointment in the last year OR does the patient have an upcoming appointment? Yes  Can we respond through MyChart? Yes  Agent: Please be advised that Rx refills may take up to 3 business days. We ask that you follow-up with your pharmacy.

## 2024-06-01 MED ORDER — FLUTICASONE PROPIONATE 50 MCG/ACT NA SUSP: NASAL | 4 refills | Status: AC

## 2024-06-01 NOTE — Telephone Encounter (Signed)
 Requested medication (s) are due for refill today - expired Rx  Requested medication (s) are on the active medication list -yes  Future visit scheduled -yes  Last refill: 03/22/21 16g 4RF  Notes to clinic: expired Rx-sent for review   Requested Prescriptions  Pending Prescriptions Disp Refills   fluticasone  (FLONASE ) 50 MCG/ACT nasal spray 16 g 4    Sig: SHAKE LIQUID AND USE 2 SPRAYS IN EACH NOSTRIL TWICE DAILY     Ear, Nose, and Throat: Nasal Preparations - Corticosteroids Passed - 06/01/2024  2:23 PM      Passed - Valid encounter within last 12 months    Recent Outpatient Visits           3 months ago Multiple joint pain   [place] [facility] [place], [place] T, NP   4 months ago Type 2 diabetes mellitus with obesity (HCC)   [place] [facility] [place], [place] T, NP   7 months ago Slow transit constipation   [place] [facility] [place], [place] T, NP                 Requested Prescriptions  Pending Prescriptions Disp Refills   fluticasone  (FLONASE ) 50 MCG/ACT nasal spray 16 g 4    Sig: SHAKE LIQUID AND USE 2 SPRAYS IN EACH NOSTRIL TWICE DAILY     Ear, Nose, and Throat: Nasal Preparations - Corticosteroids Passed - 06/01/2024  2:23 PM      Passed - Valid encounter within last 12 months    Recent Outpatient Visits           3 months ago Multiple joint pain   [place] [facility] [place], [place] T, NP   4 months ago Type 2 diabetes mellitus with obesity (HCC)   [place] [place] [facility] [place], [place] T, NP   7 months ago Slow transit constipation   [place] [facility] [place], Melanie DASEN, NP

## 2024-06-01 NOTE — Telephone Encounter (Signed)
 Duplicate request, refilled 05/20/24.  Requested Prescriptions  Pending Prescriptions Disp Refills   cetirizine  (ZYRTEC ) 10 MG tablet [Pharmacy Med Name: CETIRIZINE  10MG  TABLETS] 90 tablet 0    Sig: TAKE 1 TABLET(10 MG) BY MOUTH DAILY     Ear, Nose, and Throat:  Antihistamines 2 Passed - 06/01/2024 11:39 AM      Passed - Cr in normal range and within 360 days    Creatinine, Ser  Date Value Ref Range Status  01/04/2024 1.03 0.76 - 1.27 mg/dL Final         Passed - Valid encounter within last 12 months    Recent Outpatient Visits           3 months ago Multiple joint pain   [place] [facility] [place], [place] T, NP   4 months ago Type 2 diabetes mellitus with obesity (HCC)   [place] [facility] [place], [place] T, NP   7 months ago Slow transit constipation   [place] [facility] [place], Melanie DASEN, NP

## 2024-07-06 ENCOUNTER — Encounter: Admitting: Nurse Practitioner

## 2024-07-08 ENCOUNTER — Telehealth: Admitting: Nurse Practitioner

## 2024-07-08 ENCOUNTER — Ambulatory Visit: Admitting: Internal Medicine

## 2024-07-08 ENCOUNTER — Encounter: Payer: Self-pay | Admitting: Nurse Practitioner

## 2024-07-08 DIAGNOSIS — M25512 Pain in left shoulder: Secondary | ICD-10-CM

## 2024-07-08 MED ORDER — CETIRIZINE HCL 10 MG PO TABS: ORAL_TABLET | ORAL | 4 refills | Status: AC

## 2024-07-08 MED ORDER — METHYLPREDNISOLONE 4 MG PO TBPK: ORAL_TABLET | ORAL | 0 refills | Status: DC

## 2024-07-08 NOTE — Progress Notes (Signed)
 There were no vitals taken for this visit.   Subjective:    Patient ID: Lance Kim, male    DOB: [birth year], 53 y.o.   MRN: [id number]  HPI: Lance Kim is a 53 y.o. male  Chief Complaint  Patient presents with   Shoulder Pain    Patient states he has been having L shoulder pain for the last week and a half. States the pain is constant. Mainly feels achy but also throbs sometimes. States he has numb feelings sometimes and hurts from his shoulder down to his elbow.    Virtual Visit via Video Note  I connected with Lance Kim on 07/08/24 at  2:00 PM EDT by a video enabled telemedicine application and verified that I am speaking with the correct person using two identifiers.  Location: Patient: home Provider: work   I discussed the limitations of evaluation and management by telemedicine and the availability of in person appointments. The patient expressed understanding and agreed to proceed.  I discussed the assessment and treatment plan with the patient. The patient was provided an opportunity to ask questions and all were answered. The patient agreed with the plan and demonstrated an understanding of the instructions.   The patient was advised to call back or seek an in-person evaluation if the symptoms worsen or if the condition fails to improve as anticipated.  I provided 25 minutes of non-face-to-face time during this encounter.   Golden Emile T Genavive Kubicki, NP   SHOULDER PAIN Left shoulder hurting 1 1/2 weeks. No recent injuries or falls. Does do lifting at work, but nothing heavy. No rashes seen. Was sick recently. Duration: weeks Involved shoulder: left Mechanism of injury: unknown Location: anterior down shoulder and to elbow Onset:sudden Severity: 4/10 at present and at worst 8/10 Quality:  sharp, dull, aching, and throbbing Frequency: intermittent Radiation: yes down to elbow Aggravating factors: lifting and a heat cream made it swollen Alleviating  factors: ice and rest  Status: stable Treatments attempted: muscle rub and rest , Ibuprofen , Tylenol, Meloxicam  Relief with NSAIDs?:  no Weakness: no Numbness: no Decreased grip strength: no Redness: no Swelling: no Bruising: no Fevers: no   Relevant past medical, surgical, family and social history reviewed and updated as indicated. Interim medical history since our last visit reviewed. Allergies and medications reviewed and updated.  Review of Systems  Constitutional:  Negative for activity change, diaphoresis, fatigue and fever.  Respiratory:  Negative for cough, chest tightness, shortness of breath and wheezing.   Cardiovascular:  Negative for chest pain, palpitations and leg swelling.  Gastrointestinal: Negative.   Endocrine: Negative.   Musculoskeletal:  Positive for arthralgias.  Neurological: Negative.   Psychiatric/Behavioral: Negative.      Per HPI unless specifically indicated above     Objective:    There were no vitals taken for this visit.  Wt Readings from Last 3 Encounters:  02/25/24 251 lb (113.9 kg)  01/04/24 250 lb 6.4 oz (113.6 kg)  12/10/23 242 lb (109.8 kg)    Physical Exam Vitals and nursing note reviewed.  Constitutional:      General: He is awake. He is not in acute distress.    Appearance: He is well-developed. He is not ill-appearing.  HENT:     Head: Normocephalic.     Right Ear: Hearing normal. No drainage.     Left Ear: Hearing normal. No drainage.  Eyes:     General: Lids are normal.        Right  eye: No discharge.        Left eye: No discharge.     Conjunctiva/sclera: Conjunctivae normal.  Pulmonary:     Effort: Pulmonary effort is normal. No accessory muscle usage or respiratory distress.  Musculoskeletal:     Cervical back: Normal range of motion.  Neurological:     Mental Status: He is alert and oriented to person, place, and time.  Psychiatric:        Mood and Affect: Mood normal.        Behavior: Behavior normal. Behavior  is cooperative.        Thought Content: Thought content normal.        Judgment: Judgment normal.    Results for orders placed or performed in visit on 02/25/24  ANA 12 Plus Profile (RDL)   Collection Time: 02/25/24 11:19 AM  Result Value Ref Range   Anti-Nuclear Ab by IFA (RDL) Positive (A) Negative  C-reactive protein   Collection Time: 02/25/24 11:19 AM  Result Value Ref Range   CRP 6 0 - 10 mg/L  Sedimentation rate   Collection Time: 02/25/24 11:19 AM  Result Value Ref Range   Sed Rate 33 (H) 0 - 30 mm/hr  Rheumatoid factor   Collection Time: 02/25/24 11:19 AM  Result Value Ref Range   Rheumatoid fact SerPl-aCnc <10.0 <14.0 IU/mL  CBC with Differential/Platelet   Collection Time: 02/25/24 11:19 AM  Result Value Ref Range   WBC 7.5 3.4 - 10.8 x10E3/uL   RBC 5.22 4.14 - 5.80 x10E6/uL   Hemoglobin 14.9 13.0 - 17.7 g/dL   Hematocrit 54.3 62.4 - 51.0 %   MCV 87 79 - 97 fL   MCH 28.5 26.6 - 33.0 pg   MCHC 32.7 31.5 - 35.7 g/dL   RDW 85.7 88.3 - 84.5 %   Platelets 244 150 - 450 x10E3/uL   Neutrophils 54 Not Estab. %   Lymphs 30 Not Estab. %   Monocytes 12 Not Estab. %   Eos 3 Not Estab. %   Basos 1 Not Estab. %   Neutrophils Absolute 4.1 1.4 - 7.0 x10E3/uL   Lymphocytes Absolute 2.2 0.7 - 3.1 x10E3/uL   Monocytes Absolute 0.9 0.1 - 0.9 x10E3/uL   EOS (ABSOLUTE) 0.3 0.0 - 0.4 x10E3/uL   Basophils Absolute 0.1 0.0 - 0.2 x10E3/uL   Immature Granulocytes 0 Not Estab. %   Immature Grans (Abs) 0.0 0.0 - 0.1 x10E3/uL  ANA 12 Plus Profile, Positive   Collection Time: 02/25/24 11:19 AM  Result Value Ref Range   Speckled Pattern 1:40 (H) <1:40   Note: Comment    Anti-Centromere Ab (RDL) <1:40 <1:40   Anti-dsDNA Ab by Farr(RDL) <8.0 <8.0 IU/mL   Anti-Sm Ab (RDL) <20 <20 Units   Anti-U1 RNP Ab (RDL) <20 <20 Units   Anti-Ro (SS-A) Ab (RDL) <20 <20 Units   Anti-La (SS-B) Ab (RDL) <20 <20 Units   Anti-Scl-70 Ab (RDL) <20 <20 Units   Anti-Cardiolipin Ab, IgG (RDL) <15 <15 GPL  U/mL   Anti-Cardiolipin Ab, IgA (RDL) <12 <12 APL U/mL   Anti-Cardiolipin Ab, IgM (RDL) <13 <13 MPL U/mL   C3 Complement (RDL) 184 (H) 90 - 180 mg/dL   C4 Complement (RDL) 34 10 - 40 mg/dL   Anti-TPO Ab (RDL) <0.9 <9.0 IU/mL   Anti-Chromatin Ab, IgG (RDL) <20 <20 Units   Anti-CCP Ab, IgG & IgA (RDL) <20 <20 Units   Rheumatoid Factor by Turb RDL CANCELED IU/mL   ANA Plus 12  Interpretation Comment       Assessment & Plan:   Problem List Items Addressed This Visit       Other   Left shoulder pain - Primary   Acute episode present.  Suspect impingement syndrome based on HPI. No red flag symptoms reported.  Will send in steroid taper and recommend he start Voltaren gel or Icy/Hot Lidocaine  patches at home.  Recommend he look online YouTube for impingement syndrome exercises to perform.  If worsening or ongoing is to return to office.        Follow up plan: Return if symptoms worsen or fail to improve.

## 2024-07-08 NOTE — Assessment & Plan Note (Signed)
 Acute episode present.  Suspect impingement syndrome based on HPI. No red flag symptoms reported.  Will send in steroid taper and recommend he start Voltaren gel or Icy/Hot Lidocaine  patches at home.  Recommend he look online YouTube for impingement syndrome exercises to perform.  If worsening or ongoing is to return to office.

## 2024-07-08 NOTE — Patient Instructions (Signed)
 Shoulder Pain  Many things can cause shoulder pain, including:  An injury.  Moving the shoulder in the same way again and again (overuse).  Joint pain (arthritis).  Pain can come from:  Swelling and irritation (inflammation) of any part of the shoulder.  An injury to:  The shoulder joint.  Tissues that connect muscle to bone (tendons).  Tissues that connect bones to each other (ligaments).  Bones.  Follow these instructions at home:  Watch for changes in your symptoms. Let your doctor know about them. Follow these instructions to help with your pain.  If you have a sling that can be taken off:  Wear the sling as told by your doctor. Take it off only as told by your doctor.  Check the skin around the sling every day. Tell your doctor if you see problems.  Loosen the sling if your fingers:  Tingle.  Become numb.  Become cold.  Keep the sling clean.  If the sling is not waterproof:  Do not let it get wet.  Take the sling off when you shower or bathe.  Managing pain, stiffness, and swelling    If told, put ice on the painful area.  Put ice in a plastic bag.  Place a towel between your skin and the bag.  Leave the ice on for 20 minutes, 2-3 times a day. Stop putting ice on if it does not help with the pain.  If your skin turns bright red, take off the ice right away to prevent skin damage. The risk of damage is higher if you cannot feel pain, heat, or cold.  Squeeze a soft ball or a foam pad as much as possible. This prevents swelling in the shoulder. It also helps to strengthen the arm.  General instructions  Take over-the-counter and prescription medicines only as told by your doctor.  Keep all follow-up visits. This will help you avoid any type of permanent shoulder problems.  Contact a doctor if:  Your pain gets worse.  Medicine does not help your pain.  You have new pain in your arm, hand, or fingers.  You loosen your sling and your arm, hand, or fingers:  Tingle.  Are numb.  Are swollen.  Get help right away  if:  Your arm, hand, or fingers turn white or blue.  This information is not intended to replace advice given to you by your health care provider. Make sure you discuss any questions you have with your health care provider.  Document Revised: 04/04/2022 Document Reviewed: 04/04/2022  Elsevier Patient Education  2024 [employer].

## 2024-07-16 ENCOUNTER — Encounter: Payer: Self-pay | Admitting: Nurse Practitioner

## 2024-07-16 NOTE — Patient Instructions (Signed)

## 2024-07-20 ENCOUNTER — Ambulatory Visit (INDEPENDENT_AMBULATORY_CARE_PROVIDER_SITE_OTHER): Admitting: Nurse Practitioner

## 2024-07-20 ENCOUNTER — Encounter: Payer: Self-pay | Admitting: Nurse Practitioner

## 2024-07-20 VITALS — BP 119/80 | HR 60 | Temp 98.6°F | Ht 64.0 in | Wt 254.0 lb

## 2024-07-20 DIAGNOSIS — N400 Enlarged prostate without lower urinary tract symptoms: Secondary | ICD-10-CM

## 2024-07-20 DIAGNOSIS — E1169 Type 2 diabetes mellitus with other specified complication: Secondary | ICD-10-CM

## 2024-07-20 DIAGNOSIS — E785 Hyperlipidemia, unspecified: Secondary | ICD-10-CM

## 2024-07-20 DIAGNOSIS — Z23 Encounter for immunization: Secondary | ICD-10-CM | POA: Diagnosis not present

## 2024-07-20 DIAGNOSIS — E119 Type 2 diabetes mellitus without complications: Secondary | ICD-10-CM | POA: Diagnosis not present

## 2024-07-20 DIAGNOSIS — E1159 Type 2 diabetes mellitus with other circulatory complications: Secondary | ICD-10-CM

## 2024-07-20 DIAGNOSIS — J432 Centrilobular emphysema: Secondary | ICD-10-CM | POA: Diagnosis not present

## 2024-07-20 DIAGNOSIS — F325 Major depressive disorder, single episode, in full remission: Secondary | ICD-10-CM

## 2024-07-20 DIAGNOSIS — E669 Obesity, unspecified: Secondary | ICD-10-CM

## 2024-07-20 DIAGNOSIS — E559 Vitamin D deficiency, unspecified: Secondary | ICD-10-CM

## 2024-07-20 DIAGNOSIS — L4050 Arthropathic psoriasis, unspecified: Secondary | ICD-10-CM

## 2024-07-20 DIAGNOSIS — Z0000 Encounter for general adult medical examination without abnormal findings: Secondary | ICD-10-CM | POA: Diagnosis not present

## 2024-07-20 DIAGNOSIS — F17210 Nicotine dependence, cigarettes, uncomplicated: Secondary | ICD-10-CM

## 2024-07-20 DIAGNOSIS — E66813 Obesity, class 3: Secondary | ICD-10-CM

## 2024-07-20 LAB — BAYER DCA HB A1C WAIVED: HB A1C (BAYER DCA - WAIVED): 6.2 % — ABNORMAL HIGH (ref 4.8–5.6)

## 2024-07-20 MED ORDER — LUBIPROSTONE 24 MCG PO CAPS: 24.0000 ug | ORAL_CAPSULE | Freq: Two times a day (BID) | ORAL | 5 refills | Status: AC

## 2024-07-20 NOTE — Assessment & Plan Note (Signed)
 Chronic, stable.  Noted on CT imaging, lung cancer screening.  FEV1 108% and FEV1/FVC 100% today, remaining stable compared to previous.  No current inhalers.  Recommend complete cessation of smoking and initiate inhalers as needed.  Continue annual lung screening.

## 2024-07-20 NOTE — Assessment & Plan Note (Signed)
 BMI 43.60.  Recommended eating smaller high protein, low fat meals more frequently and exercising 30 mins a day 5 times a week with a goal of 10-15lb weight loss in the next 3 months. Patient voiced their understanding and motivation to adhere to these recommendations.

## 2024-07-20 NOTE — Assessment & Plan Note (Addendum)
I have recommended complete cessation of tobacco use. I have discussed various options available for assistance with tobacco cessation including over the counter methods (Nicotine gum, patch and lozenges). We also discussed prescription options (Chantix, Nicotine Inhaler / Nasal Spray). The patient is not interested in pursuing any prescription tobacco cessation options at this time.  Continue annual lung screening. 

## 2024-07-20 NOTE — Progress Notes (Signed)
 BP 119/80   Pulse 60   Temp 98.6 F (37 C) (Oral)   Ht 5' 4 (1.626 m) Comment: with shoes  Wt 254 lb (115.2 kg)   SpO2 97%   BMI 43.60 kg/m    Subjective:    Patient ID: Quintin LITTIE Gavel, male    DOB: Feb 19, 1971, 53 y.o.   MRN: [id number]  HPI: Lance Kim is a 53 y.o. male presenting on 07/20/2024 for comprehensive medical examination. Current medical complaints include:none   He currently lives with: daughter Interim Problems from his last visit: no  Follows with dermatology and rheumatology for psoriatic arthritis. Recently was started on Folic Acid and Methotrexate, which he is tolerating.  States his body is feeling better with this, less aches and pains. Takes Gabapentin  for pain at bedtime.  DIABETES April A1c 6.1%.  Remains off medication at this time. Had significant weight loss years ago and tries to maintain this. Does not do well with needles. Would like refills on Lubiprostone  for bowels. Hypoglycemic episodes:no Polydipsia/polyuria: no Visual disturbance: no Chest pain: no Paresthesias: no Glucose Monitoring: no             Accucheck frequency: not checking             Fasting glucose:              Post prandial:             Evening:              Before meals: Taking Insulin?: no             Long acting insulin:             Short acting insulin: Blood Pressure Monitoring: not checking Retinal Examination:Not Up To Date -- [facility] Foot Exam: Up to Date Pneumovax: Up to Date Influenza: Up to Date  Aspirin: yes    HYPERTENSION / HYPERLIPIDEMIA Takes Lisinopril -HCTZ, Amlodipine , and Atorvastatin , ASA.   Satisfied with current treatment? yes Duration of hypertension: chronic BP monitoring frequency: not checking BP range:  BP medication side effects: no Duration of hyperlipidemia: chronic Cholesterol medication side effects: no Cholesterol supplements: none Medication compliance: good compliance Aspirin: yes Recent stressors: no Recurrent  headaches: no Visual changes: no Palpitations: no Dyspnea: no Chest pain: no Lower extremity edema: no Dizzy/lightheaded: no  The ASCVD Risk score (Arnett DK, et al., 2019) failed to calculate for the following reasons:   The valid total cholesterol range is 130 to 320 mg/dL  COPD Emphysema and bilateral gynecomastia on lung screening. Last imaging on 09/14/23.  Continues to smoke daily, smokes 1/2 PPD, started smoking at age 34. COPD status: stable Satisfied with current treatment?: yes Oxygen use: no Dyspnea frequency: none Cough frequency: none Rescue inhaler frequency:  none Limitation of activity: no Productive cough: none Last Spirometry: today Pneumovax: Up to Date Influenza: Up To Date   DEPRESSION Takes Prozac  20 MG daily.   Mood status: stable Satisfied with current treatment?: yes Symptom severity: mild  Duration of current treatment : chronic Side effects: no Medication compliance: good compliance Psychotherapy/counseling: none Depressed mood: not often Anxious mood: no Anhedonia: no Significant weight loss or gain: no Insomnia: no Fatigue: no Feelings of worthlessness or guilt: no Impaired concentration/indecisiveness: no Suicidal ideations: no Hopelessness: no Crying spells: no    07/20/2024    8:56 AM 01/04/2024    4:08 PM 11/02/2023    3:28 PM 10/13/2023    4:05 PM 09/07/2023   10:07  AM  Depression screen PHQ 2/9  Decreased Interest 1 1 1 1 1   Down, Depressed, Hopeless 1 1 1 1 1   PHQ - 2 Score 2 2 2 2 2   Altered sleeping 1 1 1 1 1   Tired, decreased energy 1 1 1 1 1   Change in appetite 2 1 1 1 2   Feeling bad or failure about yourself  1 1 0 1 1  Trouble concentrating 1 1 1 1 1   Moving slowly or fidgety/restless 1 1 1 1  0  Suicidal thoughts 0 0 0 1 0  PHQ-9 Score 9 8 7 9 8   Difficult doing work/chores Not difficult at all Not difficult at all Somewhat difficult Somewhat difficult Not difficult at all       07/20/2024    8:56 AM 01/04/2024     4:08 PM 11/02/2023    3:29 PM 10/13/2023    4:06 PM  GAD 7 : Generalized Anxiety Score  Nervous, Anxious, on Edge 1 1 1 1   Control/stop worrying 1 1 1 1   Worry too much - different things 1 1 1 1   Trouble relaxing 1 1 1 1   Restless 0 1 0 1  Easily annoyed or irritable 1 1 1 1   Afraid - awful might happen 1 1 1 1   Total GAD 7 Score 6 7 6 7   Anxiety Difficulty Not difficult at all Not difficult at all Somewhat difficult Somewhat difficult   Functional Status Survey: Is the patient deaf or have difficulty hearing?: No Does the patient have difficulty seeing, even when wearing glasses/contacts?: No Does the patient have difficulty concentrating, remembering, or making decisions?: No Does the patient have difficulty walking or climbing stairs?: No Does the patient have difficulty dressing or bathing?: No Does the patient have difficulty doing errands alone such as visiting a doctor's office or shopping?: No  FALL RISK:    11/02/2023    3:28 PM 10/06/2023    9:04 AM 07/03/2023    3:48 PM 08/01/2022    4:39 PM 06/30/2022    4:09 PM  Fall Risk   Falls in the past year? 0 1 0 0 0  Number falls in past yr: 0 0 0 0 0  Injury with Fall? 0 0 0 0 0  Risk for fall due to : No Fall Risks No Fall Risks No Fall Risks No Fall Risks No Fall Risks  Follow up Falls evaluation completed Falls prevention discussed;Education provided;Falls evaluation completed Falls evaluation completed Falls evaluation completed  Falls evaluation completed      Data saved with a previous flowsheet row definition   Past Medical History:  Past Medical History:  Diagnosis Date   Anxiety    DDD (degenerative disc disease), cervical    Depression    Hyperlipidemia    Hypertension    Hypertensive renal disease without failure    Pre-diabetes    Psoriasis    Tobacco use disorder    Type 2 diabetes mellitus in patient with obesity (HCC) 02/06/2020   Surgical History:  Past Surgical History:  Procedure  Laterality Date   COLONOSCOPY WITH PROPOFOL  N/A 09/02/2019   Procedure: COLONOSCOPY WITH PROPOFOL ;  Surgeon: Therisa Bi, MD;  Location: [facility] ENDOSCOPY;  Service: Gastroenterology;  Laterality: N/A;   COLONOSCOPY WITH PROPOFOL  N/A 12/10/2023   Procedure: COLONOSCOPY WITH PROPOFOL ;  Surgeon: Therisa Bi, MD;  Location: [facility] ENDOSCOPY;  Service: Gastroenterology;  Laterality: N/A;   POLYPECTOMY  12/10/2023   Procedure: POLYPECTOMY;  Surgeon: Therisa Bi, MD;  Location: [facility] ENDOSCOPY;  Service: Gastroenterology;;   WISDOM TOOTH EXTRACTION      Medications:  Current Outpatient Medications on File Prior to Visit  Medication Sig   amLODipine  (NORVASC ) 10 MG tablet TAKE 1 TABLET(10 MG) BY MOUTH DAILY   Apremilast 30 MG TABS Take 1 tablet by mouth 2 (two) times daily.   aspirin EC 81 MG tablet Take 81 mg by mouth daily.   atorvastatin  (LIPITOR) 20 MG tablet TAKE 1 TABLET(20 MG) BY MOUTH DAILY   cetirizine  (ZYRTEC ) 10 MG tablet TAKE 1 TABLET(10 MG) BY MOUTH DAILY   Cholecalciferol  (VITAMIN D3) 1.25 MG (50000 UT) CAPS Take 1 capsule (1.25 mg total) by mouth once a week.   CONTOUR NEXT TEST test strip USE TO TEST SUGAR TWICE DAILY   desoximetasone (TOPICORT) 0.25 % cream Apply topically.   docusate sodium (COLACE) 100 MG capsule Take 100 mg by mouth 2 (two) times daily.   doxycycline (DORYX) 100 MG EC tablet Take 100 mg by mouth 2 (two) times daily.   FIBER ADULT GUMMIES PO Take 2 tablets by mouth daily.   FLUoxetine  (PROZAC ) 20 MG capsule TAKE 1 CAPSULE(20 MG) BY MOUTH DAILY   fluticasone  (FLONASE ) 50 MCG/ACT nasal spray SHAKE LIQUID AND USE 2 SPRAYS IN EACH NOSTRIL TWICE DAILY   folic acid (FOLVITE) 1 MG tablet Take 1 mg by mouth daily.   gabapentin  (NEURONTIN ) 600 MG tablet Take 0.5 tablets (300 mg total) by mouth at bedtime.   lisinopril -hydrochlorothiazide  (ZESTORETIC ) 20-12.5 MG tablet Take 1 tablet by mouth daily.   meloxicam  (MOBIC ) 7.5 MG tablet Take 1 tablet (7.5 mg total) by mouth daily.    methocarbamol  (ROBAXIN ) 500 MG tablet Take 1 tablet (500 mg total) by mouth every 8 (eight) hours as needed for muscle spasms.   methotrexate (RHEUMATREX) 2.5 MG tablet Take 2.5 mg by mouth once a week. Take six 2.5 MG tablets weekly.   Microlet Lancets MISC USE TO TEST SUGAR TWICE DAILY   mometasone (ELOCON) 0.1 % lotion Apply 1 Application topically daily.   Multiple Vitamin (MULTIVITAMIN) tablet Take 2 tablets by mouth daily.   ondansetron  (ZOFRAN -ODT) 4 MG disintegrating tablet DISSOLVE 1 TABLET ON THE TONGUE EVERY 8 HOURS AS NEEDED FOR NAUSEA   sennosides-docusate sodium (SENOKOT-S) 8.6-50 MG tablet Take 1 tablet by mouth daily.   triamcinolone  cream (KENALOG ) 0.1 % Apply 1 application topically 2 (two) times daily.   No current facility-administered medications on file prior to visit.    Allergies:  Allergies  Allergen Reactions   Mevacor [Lovastatin] Other (See Comments)    CRAMPS    Social History:  Social History   Socioeconomic History   Marital status: Legally Separated    Spouse name: Not on file   Number of children: Not on file   Years of education: Not on file   Highest education level: Not on file  Occupational History   Not on file  Tobacco Use   Smoking status: Every Day    Current packs/day: 0.75    Average packs/day: 0.8 packs/day for 32.0 years (24.0 ttl pk-yrs)    Types: Cigarettes   Smokeless tobacco: Never   Tobacco comments:    .5ppd now  Vaping Use   Vaping status: Never Used  Substance and Sexual Activity   Alcohol use: Yes    Alcohol/week: 2.0 standard drinks of alcohol    Types: 1 Glasses of wine, 1 Cans of beer per week    Comment: on occasion   Drug use:  Yes    Types: Marijuana    Comment: 12/09/23   Sexual activity: Yes    Partners: Female    Comment: satidfied with sexual function  Other Topics Concern   Not on file  Social History Narrative   Not on file   Social Drivers of Health   Financial Resource Strain: Low Risk   (05/28/2021)   Overall Financial Resource Strain (CARDIA)    Difficulty of Paying Living Expenses: Not very hard  Food Insecurity: No Food Insecurity (05/28/2021)   Hunger Vital Sign    Worried About Running Out of Food in the Last Year: Never true    Ran Out of Food in the Last Year: Never true  Transportation Needs: No Transportation Needs (05/28/2021)   PRAPARE - Administrator, Civil Service (Medical): No    Lack of Transportation (Non-Medical): No  Physical Activity: Inactive (05/28/2021)   Exercise Vital Sign    Days of Exercise per Week: 0 days    Minutes of Exercise per Session: 0 min  Stress: No Stress Concern Present (05/28/2021)   [employer] of Occupational Health - Occupational Stress Questionnaire    Feeling of Stress : Not at all  Social Connections: Not on file  Intimate Partner Violence: Not At Risk (05/28/2021)   Humiliation, Afraid, Rape, and Kick questionnaire    Fear of Current or Ex-Partner: No    Emotionally Abused: No    Physically Abused: No    Sexually Abused: No   Social History   Tobacco Use  Smoking Status Every Day   Current packs/day: 0.75   Average packs/day: 0.8 packs/day for 32.0 years (24.0 ttl pk-yrs)   Types: Cigarettes  Smokeless Tobacco Never  Tobacco Comments   .5ppd now   Social History   Substance and Sexual Activity  Alcohol Use Yes   Alcohol/week: 2.0 standard drinks of alcohol   Types: 1 Glasses of wine, 1 Cans of beer per week   Comment: on occasion    Family History:  Family History  Problem Relation Age of Onset   Diabetes Mother    Heart Problems Mother    Emphysema Father    Cancer Son    Cancer Maternal Grandmother    Prostate cancer Neg Hx    Bladder Cancer Neg Hx    Kidney cancer Neg Hx     Past medical history, surgical history, medications, allergies, family history and social history reviewed with patient today and changes made to appropriate areas of the chart.   ROS  All other ROS  negative except what is listed above and in the HPI.      Objective:    BP 119/80   Pulse 60   Temp 98.6 F (37 C) (Oral)   Ht 5' 4 (1.626 m) Comment: with shoes  Wt 254 lb (115.2 kg)   SpO2 97%   BMI 43.60 kg/m   Wt Readings from Last 3 Encounters:  07/20/24 254 lb (115.2 kg)  02/25/24 251 lb (113.9 kg)  01/04/24 250 lb 6.4 oz (113.6 kg)    Physical Exam Vitals and nursing note reviewed.  Constitutional:      General: He is awake. He is not in acute distress.    Appearance: He is well-developed and well-groomed. He is obese. He is not ill-appearing or toxic-appearing.  HENT:     Head: Normocephalic and atraumatic.     Right Ear: Hearing, tympanic membrane, ear canal and external ear normal. No drainage.  Left Ear: Hearing, tympanic membrane, ear canal and external ear normal. No drainage.     Nose: Nose normal.     Mouth/Throat:     Pharynx: Uvula midline.  Eyes:     General: Lids are normal.        Right eye: No discharge.        Left eye: No discharge.     Extraocular Movements: Extraocular movements intact.     Conjunctiva/sclera: Conjunctivae normal.     Pupils: Pupils are equal, round, and reactive to light.     Visual Fields: Right eye visual fields normal and left eye visual fields normal.  Neck:     Thyroid: No thyromegaly.     Vascular: No carotid bruit or JVD.     Trachea: Trachea normal.  Cardiovascular:     Rate and Rhythm: Normal rate and regular rhythm.     Heart sounds: Normal heart sounds, S1 normal and S2 normal. No murmur heard.    No gallop.  Pulmonary:     Effort: Pulmonary effort is normal. No accessory muscle usage or respiratory distress.     Breath sounds: Normal breath sounds.  Abdominal:     General: Bowel sounds are normal.     Palpations: Abdomen is soft. There is no hepatomegaly or splenomegaly.     Tenderness: There is no abdominal tenderness.  Musculoskeletal:        General: Normal range of motion.     Cervical back: Normal  range of motion and neck supple.     Right lower leg: No edema.     Left lower leg: No edema.  Lymphadenopathy:     Head:     Right side of head: No submental, submandibular, tonsillar, preauricular or posterior auricular adenopathy.     Left side of head: No submental, submandibular, tonsillar, preauricular or posterior auricular adenopathy.     Cervical: No cervical adenopathy.  Skin:    General: Skin is warm and dry.     Capillary Refill: Capillary refill takes less than 2 seconds.     Findings: No rash.  Neurological:     Mental Status: He is alert and oriented to person, place, and time.     Gait: Gait is intact.     Deep Tendon Reflexes: Reflexes are normal and symmetric.     Reflex Scores:      Brachioradialis reflexes are 2+ on the right side and 2+ on the left side.      Patellar reflexes are 2+ on the right side and 2+ on the left side. Psychiatric:        Attention and Perception: Attention normal.        Mood and Affect: Mood normal.        Speech: Speech normal.        Behavior: Behavior normal. Behavior is cooperative.        Thought Content: Thought content normal.        Cognition and Memory: Cognition normal.    Diabetic Foot Exam - Simple   Simple Foot Form Visual Inspection See comments: Yes Sensation Testing Intact to touch and monofilament testing bilaterally: Yes Pulse Check Posterior Tibialis and Dorsalis pulse intact bilaterally: Yes Comments Dry skin both feet.     Results for orders placed or performed in visit on 02/25/24  ANA 12 Plus Profile (RDL)   Collection Time: 02/25/24 11:19 AM  Result Value Ref Range   Anti-Nuclear Ab by IFA (RDL) Positive (A) Negative  C-reactive protein   Collection Time: 02/25/24 11:19 AM  Result Value Ref Range   CRP 6 0 - 10 mg/L  Sedimentation rate   Collection Time: 02/25/24 11:19 AM  Result Value Ref Range   Sed Rate 33 (H) 0 - 30 mm/hr  Rheumatoid factor   Collection Time: 02/25/24 11:19 AM  Result  Value Ref Range   Rheumatoid fact SerPl-aCnc <10.0 <14.0 IU/mL  CBC with Differential/Platelet   Collection Time: 02/25/24 11:19 AM  Result Value Ref Range   WBC 7.5 3.4 - 10.8 x10E3/uL   RBC 5.22 4.14 - 5.80 x10E6/uL   Hemoglobin 14.9 13.0 - 17.7 g/dL   Hematocrit 54.3 62.4 - 51.0 %   MCV 87 79 - 97 fL   MCH 28.5 26.6 - 33.0 pg   MCHC 32.7 31.5 - 35.7 g/dL   RDW 85.7 88.3 - 84.5 %   Platelets 244 150 - 450 x10E3/uL   Neutrophils 54 Not Estab. %   Lymphs 30 Not Estab. %   Monocytes 12 Not Estab. %   Eos 3 Not Estab. %   Basos 1 Not Estab. %   Neutrophils Absolute 4.1 1.4 - 7.0 x10E3/uL   Lymphocytes Absolute 2.2 0.7 - 3.1 x10E3/uL   Monocytes Absolute 0.9 0.1 - 0.9 x10E3/uL   EOS (ABSOLUTE) 0.3 0.0 - 0.4 x10E3/uL   Basophils Absolute 0.1 0.0 - 0.2 x10E3/uL   Immature Granulocytes 0 Not Estab. %   Immature Grans (Abs) 0.0 0.0 - 0.1 x10E3/uL  ANA 12 Plus Profile, Positive   Collection Time: 02/25/24 11:19 AM  Result Value Ref Range   Speckled Pattern 1:40 (H) <1:40   Note: Comment    Anti-Centromere Ab (RDL) <1:40 <1:40   Anti-dsDNA Ab by Farr(RDL) <8.0 <8.0 IU/mL   Anti-Sm Ab (RDL) <20 <20 Units   Anti-U1 RNP Ab (RDL) <20 <20 Units   Anti-Ro (SS-A) Ab (RDL) <20 <20 Units   Anti-La (SS-B) Ab (RDL) <20 <20 Units   Anti-Scl-70 Ab (RDL) <20 <20 Units   Anti-Cardiolipin Ab, IgG (RDL) <15 <15 GPL U/mL   Anti-Cardiolipin Ab, IgA (RDL) <12 <12 APL U/mL   Anti-Cardiolipin Ab, IgM (RDL) <13 <13 MPL U/mL   C3 Complement (RDL) 184 (H) 90 - 180 mg/dL   C4 Complement (RDL) 34 10 - 40 mg/dL   Anti-TPO Ab (RDL) <0.9 <9.0 IU/mL   Anti-Chromatin Ab, IgG (RDL) <20 <20 Units   Anti-CCP Ab, IgG & IgA (RDL) <20 <20 Units   Rheumatoid Factor by Turb RDL CANCELED IU/mL   ANA Plus 12 Interpretation Comment       Assessment & Plan:   Problem List Items Addressed This Visit       Cardiovascular and Mediastinum   Hypertension associated with type 2 diabetes mellitus (HCC)   Chronic,  stable. BP at goal in office today.  Recommend he monitor BP at least a few mornings a week at home and document.  DASH diet at home.  Continue current medication regimen and adjust as needed.  Labs today: CM, CBC, TSH.[name]  Refills as needed.       Relevant Orders   Bayer DCA Hb A1c Waived   Comprehensive metabolic panel with GFR   TSH     Respiratory   Centrilobular emphysema (HCC)   Chronic, stable.  Noted on CT imaging, lung cancer screening.  FEV1 108% and FEV1/FVC 100% today, remaining stable compared to previous.  No current inhalers.  Recommend complete cessation of smoking and initiate inhalers  as needed.  Continue annual lung screening.      Relevant Orders   CBC with Differential/Platelet     Endocrine   Type 2 diabetes mellitus in patient with obesity (HCC) - Primary   Chronic, stable with A1c 6.2% today and maintaining diet + weight & urine ALB 24 December 2023. At this time wishes to maintain off medication, this is appropriate and recommend continue focus on diet and regular exercise -- his daughter is a huge support with this.  Will restart medication as needed. - Vaccinations up to date - Eye exam and foot up to date - ACE and statin on board      Relevant Orders   Bayer DCA Hb A1c Waived   Hyperlipidemia associated with type 2 diabetes mellitus (HCC)   Chronic, ongoing.  Continue current medication regimen and adjust as needed.  Lipid panel today.      Relevant Orders   Bayer DCA Hb A1c Waived   Comprehensive metabolic panel with GFR   Lipid Panel w/o Chol/HDL Ratio     Musculoskeletal and Integument   Psoriatic arthritis (HCC)   Followed by dermatology and rheumatology, continue this collaboration and medication as ordered by them.  He reports improvement in pain since starting Methotrexate.      Relevant Medications   methotrexate (RHEUMATREX) 2.5 MG tablet     Other   Vitamin D  deficiency   Chronic. Taking weekly supplement.  Recheck level today and can  change to daily supplement if levels improved = 2000 units daily.      Relevant Orders   VITAMIN D  25 Hydroxy (Vit-D Deficiency, Fractures)   Obesity   BMI 43.60.  Recommended eating smaller high protein, low fat meals more frequently and exercising 30 mins a day 5 times a week with a goal of 10-15lb weight loss in the next 3 months. Patient voiced their understanding and motivation to adhere to these recommendations.       Depression   Chronic, stable.  Denies SI/HI.  Continue current medication regimen and adjust as needed.        Cigarette nicotine dependence without complication   I have recommended complete cessation of tobacco use. I have discussed various options available for assistance with tobacco cessation including over the counter methods (Nicotine gum, patch and lozenges). We also discussed prescription options (Chantix, Nicotine Inhaler / Nasal Spray). The patient is not interested in pursuing any prescription tobacco cessation options at this time. Continue annual lung screening.       Other Visit Diagnoses       Benign prostatic hyperplasia without lower urinary tract symptoms       PSA on labs today.   Relevant Orders   PSA     Flu vaccine need       Flu vaccine today, educated on this.   Relevant Orders   Flu vaccine trivalent PF, 6mos and older(Flulaval,Afluria,Fluarix,Fluzone) (Completed)     Pneumococcal vaccination given       PCV20 today, educated on this.   Relevant Orders   Pneumococcal conjugate vaccine 20-valent (Completed)     Encounter for annual physical exam       Annual physical today with labs and health maintenance reviewed, discussed with patient.       Discussed aspirin prophylaxis for myocardial infarction prevention and decision was it was not indicated  LABORATORY TESTING:  Health maintenance labs ordered today as discussed above.   The natural history of prostate cancer and ongoing controversy regarding  screening and potential  treatment outcomes of prostate cancer has been discussed with the patient. The meaning of a false positive PSA and a false negative PSA has been discussed. He indicates understanding of the limitations of this screening test and wishes to proceed with screening PSA testing.   IMMUNIZATIONS:   - Tdap: Tetanus vaccination status reviewed: last tetanus booster within 10 years. - Influenza: Provided Today - Pneumovax: Up to date - Prevnar: Provided Today - Zostavax vaccine: Refused - Covid: has had a few of these  SCREENING: - Colonoscopy: Up to date  Discussed with patient purpose of the colonoscopy is to detect colon cancer at curable precancerous or early stages   - AAA Screening: Not applicable  -Hearing Test: Not applicable  -Spirometry: Not applicable   PATIENT COUNSELING:    Sexuality: Discussed sexually transmitted diseases, partner selection, use of condoms, avoidance of unintended pregnancy  and contraceptive alternatives.   Advised to avoid cigarette smoking.  I discussed with the patient that most people either abstain from alcohol or drink within safe limits (<=14/week and <=4 drinks/occasion for males, <=7/weeks and <= 3 drinks/occasion for females) and that the risk for alcohol disorders and other health effects rises proportionally with the number of drinks per week and how often a drinker exceeds daily limits.  Discussed cessation/primary prevention of drug use and availability of treatment for abuse.   Diet: Encouraged to adjust caloric intake to maintain  or achieve ideal body weight, to reduce intake of dietary saturated fat and total fat, to limit sodium intake by avoiding high sodium foods and not adding table salt, and to maintain adequate dietary potassium and calcium  preferably from fresh fruits, vegetables, and low-fat dairy products.    Stressed the importance of regular exercise  Injury prevention: Discussed safety belts, safety helmets, smoke detector,  smoking near bedding or upholstery.   Dental health: Discussed importance of regular tooth brushing, flossing, and dental visits.   Follow up plan: NEXT PREVENTATIVE PHYSICAL DUE IN 1 YEAR. Return in about 6 months (around 01/17/2025) for T2DM, HTN/HLD, ANXIETY.

## 2024-07-20 NOTE — Assessment & Plan Note (Signed)
Chronic. Taking weekly supplement.  Recheck level today and can change to daily supplement if levels improved = 2000 units daily.

## 2024-07-20 NOTE — Assessment & Plan Note (Signed)
 Chronic, stable. BP at goal in office today.  Recommend he monitor BP at least a few mornings a week at home and document.  DASH diet at home.  Continue current medication regimen and adjust as needed.  Labs today: CM, CBC, TSH.[name]  Refills as needed.

## 2024-07-20 NOTE — Assessment & Plan Note (Signed)
 Chronic, stable.  Denies SI/HI.  Continue current medication regimen and adjust as needed.

## 2024-07-20 NOTE — Assessment & Plan Note (Signed)
 Followed by dermatology and rheumatology, continue this collaboration and medication as ordered by them.  He reports improvement in pain since starting Methotrexate.

## 2024-07-20 NOTE — Assessment & Plan Note (Addendum)
 Chronic, stable with A1c 6.2% today and maintaining diet + weight & urine ALB 24 December 2023. At this time wishes to maintain off medication, this is appropriate and recommend continue focus on diet and regular exercise -- his daughter is a huge support with this.  Will restart medication as needed. - Vaccinations up to date - Eye exam and foot up to date - ACE and statin on board

## 2024-07-20 NOTE — Assessment & Plan Note (Signed)
 Chronic, ongoing.  Continue current medication regimen and adjust as needed. Lipid panel today.

## 2024-07-21 LAB — LIPID PANEL W/O CHOL/HDL RATIO

## 2024-07-22 ENCOUNTER — Ambulatory Visit: Payer: Self-pay | Admitting: Nurse Practitioner

## 2024-07-22 LAB — COMPREHENSIVE METABOLIC PANEL WITH GFR
ALT: 38 IU/L (ref 0–44)
AST: 27 IU/L (ref 0–40)
Albumin: 3.9 g/dL (ref 3.8–4.9)
Alkaline Phosphatase: 76 IU/L (ref 47–123)
BUN/Creatinine Ratio: 8 — AB (ref 9–20)
BUN: 9 mg/dL (ref 6–24)
Bilirubin Total: 0.4 mg/dL (ref 0.0–1.2)
CO2: 24 mmol/L (ref 20–29)
Calcium: 9.4 mg/dL (ref 8.7–10.2)
Chloride: 102 mmol/L (ref 96–106)
Creatinine, Ser: 1.06 mg/dL (ref 0.76–1.27)
Globulin, Total: 2.9 g/dL (ref 1.5–4.5)
Glucose: 88 mg/dL (ref 70–99)
Potassium: 4.3 mmol/L (ref 3.5–5.2)
Sodium: 138 mmol/L (ref 134–144)
Total Protein: 6.8 g/dL (ref 6.0–8.5)
eGFR: 83 mL/min/1.73 (ref >59)

## 2024-07-22 LAB — LIPID PANEL W/O CHOL/HDL RATIO
Cholesterol, Total: 127 mg/dL (ref 100–199)
HDL: 39 mg/dL — AB (ref >39)
LDL Chol Calc (NIH): 69 mg/dL (ref 0–99)
Triglycerides: 100 mg/dL (ref 0–149)
VLDL Cholesterol Cal: 19 mg/dL (ref 5–40)

## 2024-07-22 LAB — CBC WITH DIFFERENTIAL/PLATELET
Basophils Absolute: 0.1 x10E3/uL (ref 0.0–0.2)
Basos: 1 %
EOS (ABSOLUTE): 0.2 x10E3/uL (ref 0.0–0.4)
Eos: 3 %
Hematocrit: 46.7 % (ref 37.5–51.0)
Hemoglobin: 15.3 g/dL (ref 13.0–17.7)
Immature Grans (Abs): 0 x10E3/uL (ref 0.0–0.1)
Immature Granulocytes: 0 %
Lymphocytes Absolute: 3.7 x10E3/uL — ABNORMAL HIGH (ref 0.7–3.1)
Lymphs: 51 %
MCH: 29.6 pg (ref 26.6–33.0)
MCHC: 32.8 g/dL (ref 31.5–35.7)
MCV: 90 fL (ref 79–97)
Monocytes Absolute: 0.7 x10E3/uL (ref 0.1–0.9)
Monocytes: 9 %
Neutrophils Absolute: 2.6 x10E3/uL (ref 1.4–7.0)
Neutrophils: 36 %
Platelets: 300 x10E3/uL (ref 150–450)
RBC: 5.17 x10E6/uL (ref 4.14–5.80)
RDW: 14.7 % (ref 11.6–15.4)
WBC: 7.2 x10E3/uL (ref 3.4–10.8)

## 2024-07-22 LAB — VITAMIN D 25 HYDROXY (VIT D DEFICIENCY, FRACTURES): Vit D, 25-Hydroxy: 62 ng/mL (ref 30.0–100.0)

## 2024-07-22 LAB — PSA: Prostate Specific Ag, Serum: 0.5 ng/mL (ref 0.0–4.0)

## 2024-07-22 LAB — TSH: TSH: 0.81 u[IU]/mL (ref 0.450–4.500)

## 2024-07-22 NOTE — Progress Notes (Signed)
 Contacted via MyChart  Good morning Lance Kim, your labs have returned and overall remain stable. No medication changes are needed. Any questions? Keep being amazing!!  Thank you for allowing me to participate in your care.  I appreciate you. Kindest regards, Alexie Samson

## 2024-09-14 ENCOUNTER — Inpatient Hospital Stay
Admission: RE | Admit: 2024-09-14 | Discharge: 2024-09-14 | Disposition: A | Source: Ambulatory Visit | Attending: Acute Care | Admitting: Acute Care

## 2024-09-14 DIAGNOSIS — F17210 Nicotine dependence, cigarettes, uncomplicated: Secondary | ICD-10-CM

## 2024-09-14 DIAGNOSIS — Z122 Encounter for screening for malignant neoplasm of respiratory organs: Secondary | ICD-10-CM

## 2024-09-14 DIAGNOSIS — Z87891 Personal history of nicotine dependence: Secondary | ICD-10-CM

## 2024-09-26 ENCOUNTER — Other Ambulatory Visit: Payer: Self-pay

## 2024-09-26 DIAGNOSIS — Z87891 Personal history of nicotine dependence: Secondary | ICD-10-CM

## 2024-09-26 DIAGNOSIS — Z122 Encounter for screening for malignant neoplasm of respiratory organs: Secondary | ICD-10-CM

## 2024-09-26 DIAGNOSIS — F17210 Nicotine dependence, cigarettes, uncomplicated: Secondary | ICD-10-CM

## 2024-10-11 ENCOUNTER — Encounter: Payer: Self-pay | Admitting: Nurse Practitioner

## 2024-10-11 ENCOUNTER — Ambulatory Visit: Admitting: Nurse Practitioner

## 2024-10-11 VITALS — BP 119/82 | HR 75 | Temp 98.0°F | Ht 64.02 in | Wt 255.6 lb

## 2024-10-11 DIAGNOSIS — M542 Cervicalgia: Secondary | ICD-10-CM

## 2024-10-11 MED ORDER — CYCLOBENZAPRINE HCL 5 MG PO TABS: 5.0000 mg | ORAL_TABLET | Freq: Every day | ORAL | 1 refills | Status: AC

## 2024-10-11 NOTE — Progress Notes (Signed)
 "  BP 119/82 (BP Location: Right Arm, Patient Position: Sitting, Cuff Size: Large)   Pulse 75   Temp 98 F (36.7 C) (Oral)   Ht 5' 4.02 (1.626 m)   Wt 255 lb 9.6 oz (115.9 kg)   SpO2 97%   BMI 43.85 kg/m    Subjective:    Patient ID: Quintin Gavel, male    DOB: August 10, 1971, 54 y.o.   MRN: [id number]  HPI: Aarya Quebedeaux is a 54 y.o. male  Chief Complaint  Patient presents with   Neck Pain    Patient stated the pain started before christmas and it's been off and on, but the pain is getting worse.   Shoulder Pain   Patient presents to clinic with complaints of neck and shoulder pain.  Patient states he has been having pain since Christmas.  Feels like there is swelling in his shoulder and neck.  He denies any injuries.  He tried to put heat on it which made it worse.  Ice did help some.  He has been taking Ibuprofen  and tylenol.  Helps to dull the pain for awhile but doesn't resolve the issue.  States the pain is a 3/4 today- he took Ibuprofen  this morning.  When he turns his neck to the left he has pain, has limited ROM.  He is having trouble sleeping.  His neck has been stiff.      Relevant past medical, surgical, family and social history reviewed and updated as indicated. Interim medical history since our last visit reviewed. Allergies and medications reviewed and updated.  Review of Systems  Musculoskeletal:        Neck and shoulder pain    Per HPI unless specifically indicated above     Objective:    BP 119/82 (BP Location: Right Arm, Patient Position: Sitting, Cuff Size: Large)   Pulse 75   Temp 98 F (36.7 C) (Oral)   Ht 5' 4.02 (1.626 m)   Wt 255 lb 9.6 oz (115.9 kg)   SpO2 97%   BMI 43.85 kg/m   Wt Readings from Last 3 Encounters:  10/11/24 255 lb 9.6 oz (115.9 kg)  07/20/24 254 lb (115.2 kg)  02/25/24 251 lb (113.9 kg)    Physical Exam Vitals and nursing note reviewed.  Constitutional:      General: He is not in acute distress.    Appearance: Normal  appearance. He is not ill-appearing, toxic-appearing or diaphoretic.  HENT:     Head: Normocephalic.     Right Ear: External ear normal.     Left Ear: External ear normal.     Nose: Nose normal. No congestion or rhinorrhea.     Mouth/Throat:     Mouth: Mucous membranes are moist.  Eyes:     General:        Right eye: No discharge.        Left eye: No discharge.     Extraocular Movements: Extraocular movements intact.     Conjunctiva/sclera: Conjunctivae normal.     Pupils: Pupils are equal, round, and reactive to light.  Cardiovascular:     Rate and Rhythm: Normal rate and regular rhythm.     Heart sounds: No murmur heard. Pulmonary:     Effort: Pulmonary effort is normal. No respiratory distress.     Breath sounds: Normal breath sounds. No wheezing, rhonchi or rales.  Abdominal:     General: Abdomen is flat. Bowel sounds are normal.  Musculoskeletal:     Cervical back: Normal  range of motion and neck supple.  Skin:    General: Skin is warm and dry.     Capillary Refill: Capillary refill takes less than 2 seconds.  Neurological:     General: No focal deficit present.     Mental Status: He is alert and oriented to person, place, and time.  Psychiatric:        Mood and Affect: Mood normal.        Behavior: Behavior normal.        Thought Content: Thought content normal.        Judgment: Judgment normal.     Results for orders placed or performed in visit on 07/20/24  Bayer DCA Hb A1c Waived   Collection Time: 07/20/24  9:35 AM  Result Value Ref Range   HB A1C (BAYER DCA - WAIVED) 6.2 (H) 4.8 - 5.6 %  CBC with Differential/Platelet   Collection Time: 07/20/24  9:37 AM  Result Value Ref Range   WBC 7.2 3.4 - 10.8 x10E3/uL   RBC 5.17 4.14 - 5.80 x10E6/uL   Hemoglobin 15.3 13.0 - 17.7 g/dL   Hematocrit 53.2 62.4 - 51.0 %   MCV 90 79 - 97 fL   MCH 29.6 26.6 - 33.0 pg   MCHC 32.8 31.5 - 35.7 g/dL   RDW 85.2 88.3 - 84.5 %   Platelets 300 150 - 450 x10E3/uL   Neutrophils  36 Not Estab. %   Lymphs 51 Not Estab. %   Monocytes 9 Not Estab. %   Eos 3 Not Estab. %   Basos 1 Not Estab. %   Neutrophils Absolute 2.6 1.4 - 7.0 x10E3/uL   Lymphocytes Absolute 3.7 (H) 0.7 - 3.1 x10E3/uL   Monocytes Absolute 0.7 0.1 - 0.9 x10E3/uL   EOS (ABSOLUTE) 0.2 0.0 - 0.4 x10E3/uL   Basophils Absolute 0.1 0.0 - 0.2 x10E3/uL   Immature Granulocytes 0 Not Estab. %   Immature Grans (Abs) 0.0 0.0 - 0.1 x10E3/uL  Comprehensive metabolic panel with GFR   Collection Time: 07/20/24  9:37 AM  Result Value Ref Range   Glucose 88 70 - 99 mg/dL   BUN 9 6 - 24 mg/dL   Creatinine, Ser 8.93 0.76 - 1.27 mg/dL   eGFR 83 >40 fO/fpw/8.26   BUN/Creatinine Ratio 8 (L) 9 - 20   Sodium 138 134 - 144 mmol/L   Potassium 4.3 3.5 - 5.2 mmol/L   Chloride 102 96 - 106 mmol/L   CO2 24 20 - 29 mmol/L   Calcium  9.4 8.7 - 10.2 mg/dL   Total Protein 6.8 6.0 - 8.5 g/dL   Albumin 3.9 3.8 - 4.9 g/dL   Globulin, Total 2.9 1.5 - 4.5 g/dL   Bilirubin Total 0.4 0.0 - 1.2 mg/dL   Alkaline Phosphatase 76 47 - 123 IU/L   AST 27 0 - 40 IU/L   ALT 38 0 - 44 IU/L  Lipid Panel w/o Chol/HDL Ratio   Collection Time: 07/20/24  9:37 AM  Result Value Ref Range   Cholesterol, Total 127 100 - 199 mg/dL   Triglycerides 899 0 - 149 mg/dL   HDL 39 (L) >60 mg/dL   VLDL Cholesterol Cal 19 5 - 40 mg/dL   LDL Chol Calc (NIH) 69 0 - 99 mg/dL  TSH   Collection Time: 07/20/24  9:37 AM  Result Value Ref Range   TSH 0.810 0.450 - 4.500 uIU/mL  PSA   Collection Time: 07/20/24  9:37 AM  Result Value Ref Range  Prostate Specific Ag, Serum 0.5 0.0 - 4.0 ng/mL  VITAMIN D  25 Hydroxy (Vit-D Deficiency, Fractures)   Collection Time: 07/20/24  9:37 AM  Result Value Ref Range   Vit D, 25-Hydroxy 62.0 30.0 - 100.0 ng/mL      Assessment & Plan:   Problem List Items Addressed This Visit   None Visit Diagnoses       Neck pain    -  Primary   Will treat with Cyclobenzaprine .  Side effects and benefits discussed.  Stretches  given. Declined physical therapy referral.        Follow up plan: Return if symptoms worsen or fail to improve.      "

## 2025-01-20 ENCOUNTER — Ambulatory Visit: Admitting: Nurse Practitioner
